# Patient Record
Sex: Female | Born: 1945
Health system: Southern US, Community
[De-identification: ages and names within clinical notes are randomized; demographics above are authoritative.]

## PROBLEM LIST (undated history)

## (undated) DIAGNOSIS — Z8639 Personal history of other endocrine, nutritional and metabolic disease: Secondary | ICD-10-CM

## (undated) DIAGNOSIS — T8859XA Other complications of anesthesia, initial encounter: Secondary | ICD-10-CM

## (undated) DIAGNOSIS — Z8619 Personal history of other infectious and parasitic diseases: Secondary | ICD-10-CM

## (undated) DIAGNOSIS — M1990 Unspecified osteoarthritis, unspecified site: Secondary | ICD-10-CM

## (undated) DIAGNOSIS — G459 Transient cerebral ischemic attack, unspecified: Secondary | ICD-10-CM

## (undated) DIAGNOSIS — R0789 Other chest pain: Secondary | ICD-10-CM

## (undated) DIAGNOSIS — M2351 Chronic instability of knee, right knee: Secondary | ICD-10-CM

## (undated) DIAGNOSIS — J189 Pneumonia, unspecified organism: Secondary | ICD-10-CM

## (undated) DIAGNOSIS — I10 Essential (primary) hypertension: Secondary | ICD-10-CM

## (undated) DIAGNOSIS — R0602 Shortness of breath: Secondary | ICD-10-CM

## (undated) DIAGNOSIS — K219 Gastro-esophageal reflux disease without esophagitis: Secondary | ICD-10-CM

## (undated) DIAGNOSIS — E119 Type 2 diabetes mellitus without complications: Secondary | ICD-10-CM

## (undated) HISTORY — PX: COLONOSCOPY: SHX174

## (undated) HISTORY — PX: DILATION AND CURETTAGE OF UTERUS: SHX78

---

## 2009-11-25 ENCOUNTER — Ambulatory Visit: Payer: Self-pay

## 2010-02-15 ENCOUNTER — Ambulatory Visit: Payer: Self-pay | Admitting: Family Medicine

## 2010-02-27 ENCOUNTER — Ambulatory Visit: Payer: Self-pay | Admitting: Family Medicine

## 2010-03-22 ENCOUNTER — Ambulatory Visit: Payer: Self-pay | Admitting: Surgery

## 2010-03-24 LAB — PATHOLOGY REPORT

## 2011-06-05 HISTORY — PX: CATARACT EXTRACTION: SUR2

## 2012-01-14 ENCOUNTER — Other Ambulatory Visit: Payer: Self-pay | Admitting: Orthopedic Surgery

## 2012-01-14 MED ORDER — BUPIVACAINE LIPOSOME 1.3 % IJ SUSP: 20.0000 mL | Freq: Once | INTRAMUSCULAR | Status: DC

## 2012-01-14 MED ORDER — DEXAMETHASONE SODIUM PHOSPHATE 10 MG/ML IJ SOLN: 10.0000 mg | Freq: Once | INTRAMUSCULAR | Status: DC

## 2012-01-14 NOTE — Progress Notes (Signed)
Preoperative surgical orders have been place into the [facility] hospital system for [place] on 01/14/2012, 9:30 AM  by Patrica Duel for surgery on 02/25/2012.  Preop Total Hip orders including Experel Injecion, IV Tylenol, and IV Decadron as long as there are no contraindications to the above medications. Avel Peace, PA-C

## 2012-01-30 ENCOUNTER — Ambulatory Visit: Payer: Self-pay | Admitting: Family Medicine

## 2012-02-12 ENCOUNTER — Ambulatory Visit: Payer: Self-pay | Admitting: Family Medicine

## 2012-02-18 ENCOUNTER — Encounter (HOSPITAL_COMMUNITY): Payer: Self-pay | Admitting: Pharmacy Technician

## 2012-02-20 ENCOUNTER — Encounter (HOSPITAL_COMMUNITY)
Admission: RE | Admit: 2012-02-20 | Discharge: 2012-02-20 | Disposition: A | Discharge status: Home / Self Care | Payer: Medicare Other | Source: Ambulatory Visit | Attending: Orthopedic Surgery | Admitting: Orthopedic Surgery

## 2012-02-20 ENCOUNTER — Ambulatory Visit (HOSPITAL_COMMUNITY)
Admission: RE | Admit: 2012-02-20 | Discharge: 2012-02-20 | Disposition: A | Discharge status: Home / Self Care | Payer: Medicare Other | Source: Ambulatory Visit | Attending: Orthopedic Surgery | Admitting: Orthopedic Surgery

## 2012-02-20 ENCOUNTER — Encounter (HOSPITAL_COMMUNITY): Payer: Self-pay

## 2012-02-20 DIAGNOSIS — K219 Gastro-esophageal reflux disease without esophagitis: Secondary | ICD-10-CM | POA: Insufficient documentation

## 2012-02-20 DIAGNOSIS — M169 Osteoarthritis of hip, unspecified: Secondary | ICD-10-CM | POA: Insufficient documentation

## 2012-02-20 DIAGNOSIS — M1610 Unilateral primary osteoarthritis, unspecified hip: Secondary | ICD-10-CM | POA: Insufficient documentation

## 2012-02-20 DIAGNOSIS — Z01812 Encounter for preprocedural laboratory examination: Secondary | ICD-10-CM | POA: Insufficient documentation

## 2012-02-20 HISTORY — DX: Unspecified osteoarthritis, unspecified site: M19.90

## 2012-02-20 HISTORY — DX: Gastro-esophageal reflux disease without esophagitis: K21.9

## 2012-02-20 LAB — CBC
HCT: 43 % (ref 36.0–46.0)
Hemoglobin: 14.4 g/dL (ref 12.0–15.0)
MCH: 31.9 pg (ref 26.0–34.0)
MCHC: 33.5 g/dL (ref 30.0–36.0)
MCV: 95.1 fL (ref 78.0–100.0)
Platelets: 276 10*3/uL (ref 150–400)
RBC: 4.52 MIL/uL (ref 3.87–5.11)
RDW: 12 % (ref 11.5–15.5)
WBC: 6.2 10*3/uL (ref 4.0–10.5)

## 2012-02-20 LAB — COMPREHENSIVE METABOLIC PANEL
ALT: 43 U/L — ABNORMAL HIGH (ref 0–35)
AST: 28 U/L (ref 0–37)
Albumin: 4.3 g/dL (ref 3.5–5.2)
Alkaline Phosphatase: 123 U/L — ABNORMAL HIGH (ref 39–117)
BUN: 17 mg/dL (ref 6–23)
CO2: 28 mEq/L (ref 19–32)
Calcium: 10.7 mg/dL — ABNORMAL HIGH (ref 8.4–10.5)
Chloride: 103 mEq/L (ref 96–112)
Creatinine, Ser: 0.91 mg/dL (ref 0.50–1.10)
GFR calc Af Amer: 73 mL/min — ABNORMAL LOW (ref >90)
GFR calc non Af Amer: 63 mL/min — ABNORMAL LOW (ref >90)
Glucose, Bld: 91 mg/dL (ref 70–99)
Potassium: 4.4 mEq/L (ref 3.5–5.1)
Sodium: 140 mEq/L (ref 135–145)
Total Bilirubin: 0.5 mg/dL (ref 0.3–1.2)
Total Protein: 7.5 g/dL (ref 6.0–8.3)

## 2012-02-20 LAB — URINALYSIS, ROUTINE W REFLEX MICROSCOPIC
Bilirubin Urine: NEGATIVE
Glucose, UA: NEGATIVE mg/dL
Ketones, ur: NEGATIVE mg/dL
Nitrite: NEGATIVE
Protein, ur: NEGATIVE mg/dL
Specific Gravity, Urine: 1.012 (ref 1.005–1.030)
Urobilinogen, UA: 0.2 mg/dL (ref 0.0–1.0)
pH: 6 (ref 5.0–8.0)

## 2012-02-20 LAB — SURGICAL PCR SCREEN
MRSA, PCR: NEGATIVE
Staphylococcus aureus: NEGATIVE

## 2012-02-20 LAB — URINE MICROSCOPIC-ADD ON

## 2012-02-20 LAB — PROTIME-INR
INR: 0.94 (ref 0.00–1.49)
Prothrombin Time: 12.5 seconds (ref 11.6–15.2)

## 2012-02-20 LAB — APTT: aPTT: 29 seconds (ref 24–37)

## 2012-02-20 MED ORDER — CHLORHEXIDINE GLUCONATE 4 % EX LIQD
60.0000 mL | Freq: Once | CUTANEOUS | Status: DC
  Filled 2012-02-20: qty 60

## 2012-02-20 NOTE — Progress Notes (Signed)
Abnormal UA faxed to Dr. Lequita Halt - confirmation recieved

## 2012-02-20 NOTE — Patient Instructions (Signed)
20 Charlotte Burns  02/20/2012   Your procedure is scheduled on: 02/25/12 AT 3:30 PM  Report to SHORT STAY DEPT  At  1:00 PM.  Call this number if you have problems the morning of surgery: [phone]   Remember:   Do not eat food or drink liquids AFTER MIDNIGHT  May have clear liquids UNTIL 6 HOURS BEFORE SURGERY (9:30 AM)  Clear liquids include soda, tea, black coffee, apple or grape juice, broth.  Take these medicines the morning of surgery with A SIP OF WATER:RANITIDINE / TRAMADOL   Do not wear jewelry, make-up or nail polish.  Do not wear lotions, powders, or perfumes.   Do not shave legs or underarms 48 hrs. before surgery (men may shave face)  Do not bring valuables to the hospital.  Contacts, dentures or bridgework may not be worn into surgery.  Leave suitcase in the car. After surgery it may be brought to your room.  For patients admitted to the hospital, checkout time is 11:00 AM the day of discharge.   Patients discharged the day of surgery will not be allowed to drive home. If going home same day of surgery, must have someone stay with you first 24 hrs at home and arrange for some one to drive you home from hospital.    Special Instructions:   Please read over the following fact sheets that you were given: MRSA  Information / Incentive Spirometer               SHOWER WITH BETASEPT THE NIGHT BEFORE SURGERY AND THE MORNING OF SURGERY          X_______________________________________________________________

## 2012-02-21 ENCOUNTER — Other Ambulatory Visit: Payer: Self-pay | Admitting: Orthopedic Surgery

## 2012-02-21 NOTE — H&P (Signed)
Charlotte Burns  DOB: [birth year] Married / Language: English / Race: White Female  Date of Admission:  02/25/2012  Chief complaint: Right Hip Pain  History of Present Illness The patient is a 66 year old female who comes in for a preoperative History and Physical. The patient is scheduled for a right total hip arthroplasty to be performed by Dr. Gus Rankin. Aluisio, MD at [facility] on 02/25/2012. The patient reports right hip problems including pain symptoms that have been present for 6 month(s). The symptoms began without any known injury (Patient states that she has pain in her right hip that radiates into her groin and down her leg. She said that she was told she would need a hip replacement.). The patient feels as if their symptoms are does feel they are worsening. This pain has been going on for a long time now. It is getting progressively worse over the past six months. It is limiting what she can and cannot do. She has lost range of motion in the hip. She has a hard time doing shoes and socks or trimming her toenails. She states she is at a stage where she feels like she needs to have something done because of the intense pain and dysfunction that she is experiencing. Her left hip is not hurting. She is not having lower back pain. She does not have lower extremity weakness or paresthesia with this.  She is ready to proceed with hip surgery. They have been treated conservatively in the past for the above stated problem and despite conservative measures, they continue to have progressive pain and severe functional limitations and dysfunction. They have failed non-operative management. It is felt that they would benefit from undergoing total joint replacement. Risks and benefits of the procedure have been discussed with the patient and they elect to proceed with surgery. There are no active contraindications to surgery such as ongoing infection or rapidly progressive  neurological disease.   Problem List Osteoarthritis, Hip (715.35) Knee Pain (719.46)   Allergies No Known Drug Allergies   Family History Congestive Heart Failure. mother Diabetes Mellitus. mother and sister Heart Disease. sister and brother Cancer. father and sister Cerebrovascular Accident. mother and sister Heart disease in female family member before age 60 Hypertension. mother Rheumatoid Arthritis. father and sister   Social History Living situation. live with spouse Marital status. 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 no Exercise. Exercises rarely Illicit drug use. no Pain Contract. no Previously in rehab. no Tobacco / smoke exposure. no Tobacco use. former smoker; smoke(d) 3 or more pack(s) per day Current work status. working part time Alcohol use. never consumed alcohol Children. 4 Post-Surgical Plans. Plan is to go home. Advance Directives. Living Will, Healthcare POA   Medication History TraMADol HCl (50MG  Tablet, Oral) Active. Diclofenac Sodium (75MG  Tablet DR, Oral) Active. Gabapentin (300MG  Capsule, Oral) Active. Zantac (150MG  Tablet, Oral) Active.   Pregnancy / Birth History Pregnant. no   Past Surgical History Dilation and Curettage of Uterus  Medical History Cataract Hypertension Hypercholesterolemia Gastroesophageal Reflux Disease   Review of Systems General:Not Present- Chills, Fever, Night Sweats, Fatigue, Weight Gain, Weight Loss and Memory Loss. Skin:Not Present- Hives, Itching, Rash, Eczema and Lesions. HEENT:Not Present- Tinnitus, Headache, Double Vision, Visual Loss, Hearing Loss and Dentures. Respiratory:Not Present- Shortness of breath with exertion, Shortness of breath at rest, Allergies, Coughing up blood and Chronic Cough. Cardiovascular:Not Present- Chest Pain, Racing/skipping heartbeats, Difficulty Breathing Lying Down, Murmur,  Swelling and Palpitations.  Gastrointestinal:Not Present- Bloody Stool, Heartburn, Abdominal Pain, Vomiting, Nausea, Constipation, Diarrhea, Difficulty Swallowing, Jaundice and Loss of appetitie. Female Genitourinary:Not Present- Blood in Urine, Urinary frequency, Weak urinary stream, Discharge, Flank Pain, Incontinence, Painful Urination, Urgency, Urinary Retention and Urinating at Night. Musculoskeletal:Present- Joint Pain. Not Present- Muscle Weakness, Muscle Pain, Joint Swelling, Back Pain, Morning Stiffness and Spasms. Neurological:Not Present- Tremor, Dizziness, Blackout spells, Paralysis, Difficulty with balance and Weakness. Psychiatric:Not Present- Insomnia.   Vitals Weight: 200 lb Height: 68 in Weight was reported by patient. Height was reported by patient. Body Surface Area: 2.09 m Body Mass Index: 30.41 kg/m Pulse: 74 (Regular) Resp.: 12 (Unlabored) BP: 114/72 (Sitting, Right Arm, Standard)    Physical Exam The physical exam findings are as follows:  Note: Patient is a 66 year old female with continued right hip pain. Patient is accompanied today by her husband.   General Mental Status - Alert, cooperative and good historian. General Appearance- pleasant. Not in acute distress. Orientation- Oriented X3. Build & Nutrition- Well nourished and Well developed.   Head and Neck Head- normocephalic, atraumatic . Neck Global Assessment- supple. no bruit auscultated on the right and no bruit auscultated on the left.   Eye Pupil- Bilateral- Regular and Round. Motion- Bilateral- EOMI.   ENMT  Partial upper plate  Chest and Lung Exam Auscultation: Breath sounds:- clear at anterior chest wall and - clear at posterior chest wall. Adventitious sounds:- No Adventitious sounds.   Cardiovascular Auscultation:Rhythm- Regular rate and rhythm. Heart Sounds- S1 WNL and S2 WNL. Murmurs & Other Heart Sounds:Auscultation of the heart  reveals - No Murmurs.   Abdomen Inspection:Contour- Generalized mild distention. Palpation/Percussion:Tenderness- Abdomen is non-tender to palpation. Rigidity (guarding)- Abdomen is soft. Auscultation:Auscultation of the abdomen reveals - Bowel sounds normal.   Female Genitourinary Not done, not pertinent to present illness  Musculoskeletal Very pleasant, well developed female alert and oriented in no apparent distress. The left hip can be flexed to 100, rotated in 20, out 30 and abducted 30 without discomfort. Right hip flexion to 90. No internal rotation, about 10 external rotation and 10 abduction. Pulse, sensation, and motor intact both lower extremities. Knee exam is unremarkable on the right. Gait pattern is significantly antalgic with an abductor lurch.  RADIOGRAPHS: AP pelvis and AP and lateral of the right hip show advanced end stage arthritis of the right hip with bone on bone change and subchondral cyst. AP both knees and lateral show that she does not have any significant arthritic change in either knee.  Assessment & Plan Osteoarthritis, Hip (715.35) Impression: Right Hip  Note: Patient is for a Right Total Hip Replacement by Dr. Lequita Halt.  Plan is to go home.  PCP - Dr. Jerl Mina - Patient has been seen preoperatively and felt to be stable for surgery.  Signed electronically by Roberts Gaudy, PA-C

## 2012-02-22 NOTE — Progress Notes (Signed)
Received fax from Dr.Aluisio - Rx Cipro called into pts pharmacy

## 2012-02-25 ENCOUNTER — Encounter (HOSPITAL_COMMUNITY)
Admission: RE | Disposition: A | Discharge status: Home Health | Payer: Self-pay | Source: Ambulatory Visit | Attending: Orthopedic Surgery

## 2012-02-25 ENCOUNTER — Encounter (HOSPITAL_COMMUNITY): Payer: Self-pay | Admitting: *Deleted

## 2012-02-25 ENCOUNTER — Inpatient Hospital Stay (HOSPITAL_COMMUNITY): Payer: Medicare Other

## 2012-02-25 ENCOUNTER — Encounter (HOSPITAL_COMMUNITY): Payer: Self-pay | Admitting: Anesthesiology

## 2012-02-25 ENCOUNTER — Inpatient Hospital Stay (HOSPITAL_COMMUNITY): Payer: Medicare Other | Admitting: Anesthesiology

## 2012-02-25 ENCOUNTER — Inpatient Hospital Stay (HOSPITAL_COMMUNITY)
Admission: RE | Admit: 2012-02-25 | Discharge: 2012-02-27 | DRG: 470 | Disposition: A | Discharge status: Home Health | Payer: Medicare Other | Source: Ambulatory Visit | Attending: Orthopedic Surgery | Admitting: Orthopedic Surgery

## 2012-02-25 DIAGNOSIS — M1610 Unilateral primary osteoarthritis, unspecified hip: Principal | ICD-10-CM | POA: Diagnosis present

## 2012-02-25 DIAGNOSIS — E785 Hyperlipidemia, unspecified: Secondary | ICD-10-CM | POA: Diagnosis present

## 2012-02-25 DIAGNOSIS — Z96649 Presence of unspecified artificial hip joint: Secondary | ICD-10-CM

## 2012-02-25 DIAGNOSIS — K219 Gastro-esophageal reflux disease without esophagitis: Secondary | ICD-10-CM | POA: Diagnosis present

## 2012-02-25 DIAGNOSIS — M169 Osteoarthritis of hip, unspecified: Secondary | ICD-10-CM | POA: Diagnosis present

## 2012-02-25 DIAGNOSIS — E871 Hypo-osmolality and hyponatremia: Secondary | ICD-10-CM | POA: Diagnosis not present

## 2012-02-25 DIAGNOSIS — D62 Acute posthemorrhagic anemia: Secondary | ICD-10-CM | POA: Diagnosis not present

## 2012-02-25 HISTORY — PX: TOTAL HIP ARTHROPLASTY: SHX124

## 2012-02-25 LAB — TYPE AND SCREEN
ABO/RH(D): O POS
Antibody Screen: NEGATIVE

## 2012-02-25 LAB — ABO/RH: ABO/RH(D): O POS

## 2012-02-25 SURGERY — ARTHROPLASTY, HIP, TOTAL,POSTERIOR APPROACH
Anesthesia: General | Site: Hip | Laterality: Right | Wound class: Clean

## 2012-02-25 MED ORDER — METOCLOPRAMIDE HCL 5 MG/ML IJ SOLN
5.0000 mg | Freq: Three times a day (TID) | INTRAMUSCULAR | Status: DC | PRN
  Administered 2012-02-25: 10 mg via INTRAVENOUS
  Filled 2012-02-25: qty 2

## 2012-02-25 MED ORDER — MIDAZOLAM HCL 5 MG/5ML IJ SOLN
INTRAMUSCULAR | Status: DC | PRN
  Administered 2012-02-25: 2 mg via INTRAVENOUS

## 2012-02-25 MED ORDER — DEXTROSE 5 % IV SOLN
3.0000 g | INTRAVENOUS | Status: AC
  Administered 2012-02-25: 2 g via INTRAVENOUS
  Filled 2012-02-25: qty 3000

## 2012-02-25 MED ORDER — HYDROMORPHONE HCL PF 1 MG/ML IJ SOLN
INTRAMUSCULAR | Status: AC
  Filled 2012-02-25: qty 1

## 2012-02-25 MED ORDER — ACETAMINOPHEN 325 MG PO TABS: 650.0000 mg | ORAL_TABLET | Freq: Four times a day (QID) | ORAL | Status: DC | PRN

## 2012-02-25 MED ORDER — ACETAMINOPHEN 650 MG RE SUPP: 650.0000 mg | Freq: Four times a day (QID) | RECTAL | Status: DC | PRN

## 2012-02-25 MED ORDER — RIVAROXABAN 10 MG PO TABS
10.0000 mg | ORAL_TABLET | Freq: Every day | ORAL | Status: DC
  Administered 2012-02-26 – 2012-02-27 (×2): 10 mg via ORAL
  Filled 2012-02-25 (×4): qty 1

## 2012-02-25 MED ORDER — DOCUSATE SODIUM 100 MG PO CAPS
100.0000 mg | ORAL_CAPSULE | Freq: Two times a day (BID) | ORAL | Status: DC
  Administered 2012-02-25 – 2012-02-27 (×4): 100 mg via ORAL

## 2012-02-25 MED ORDER — ACETAMINOPHEN 10 MG/ML IV SOLN: 1000.0000 mg | Freq: Once | INTRAVENOUS | Status: DC

## 2012-02-25 MED ORDER — LACTATED RINGERS IV SOLN: INTRAVENOUS | Status: DC

## 2012-02-25 MED ORDER — FLEET ENEMA 7-19 GM/118ML RE ENEM: 1.0000 | ENEMA | Freq: Once | RECTAL | Status: AC | PRN

## 2012-02-25 MED ORDER — MORPHINE SULFATE 2 MG/ML IJ SOLN
1.0000 mg | INTRAMUSCULAR | Status: DC | PRN
  Administered 2012-02-25: 1 mg via INTRAVENOUS
  Filled 2012-02-25: qty 1

## 2012-02-25 MED ORDER — HYDROMORPHONE HCL PF 1 MG/ML IJ SOLN
0.2500 mg | INTRAMUSCULAR | Status: DC | PRN
  Administered 2012-02-25 (×3): 0.5 mg via INTRAVENOUS

## 2012-02-25 MED ORDER — PHENOL 1.4 % MT LIQD
1.0000 | OROMUCOSAL | Status: DC | PRN
  Filled 2012-02-25: qty 177

## 2012-02-25 MED ORDER — ZOLPIDEM TARTRATE 5 MG PO TABS: 5.0000 mg | ORAL_TABLET | Freq: Every evening | ORAL | Status: DC | PRN

## 2012-02-25 MED ORDER — PROMETHAZINE HCL 25 MG/ML IJ SOLN: 6.2500 mg | INTRAMUSCULAR | Status: DC | PRN

## 2012-02-25 MED ORDER — METHOCARBAMOL 500 MG PO TABS
500.0000 mg | ORAL_TABLET | Freq: Four times a day (QID) | ORAL | Status: DC | PRN
  Administered 2012-02-26 (×2): 500 mg via ORAL
  Filled 2012-02-25 (×2): qty 1

## 2012-02-25 MED ORDER — ONDANSETRON HCL 4 MG/2ML IJ SOLN: 4.0000 mg | Freq: Four times a day (QID) | INTRAMUSCULAR | Status: DC | PRN

## 2012-02-25 MED ORDER — GABAPENTIN 300 MG PO CAPS
600.0000 mg | ORAL_CAPSULE | Freq: Every day | ORAL | Status: DC
  Administered 2012-02-25 – 2012-02-26 (×2): 600 mg via ORAL
  Filled 2012-02-25 (×4): qty 2

## 2012-02-25 MED ORDER — 0.9 % SODIUM CHLORIDE (POUR BTL) OPTIME
TOPICAL | Status: DC | PRN
  Administered 2012-02-25: 1000 mL

## 2012-02-25 MED ORDER — OXYCODONE HCL 5 MG PO TABS
5.0000 mg | ORAL_TABLET | ORAL | Status: DC | PRN
  Administered 2012-02-25 – 2012-02-27 (×6): 5 mg via ORAL
  Administered 2012-02-27: 10 mg via ORAL
  Filled 2012-02-25: qty 1
  Filled 2012-02-25: qty 2
  Filled 2012-02-25 (×5): qty 1

## 2012-02-25 MED ORDER — SODIUM CHLORIDE 0.9 % IV SOLN: INTRAVENOUS | Status: DC

## 2012-02-25 MED ORDER — BUPIVACAINE LIPOSOME 1.3 % IJ SUSP
20.0000 mL | INTRAMUSCULAR | Status: DC
  Filled 2012-02-25: qty 20

## 2012-02-25 MED ORDER — FENTANYL CITRATE 0.05 MG/ML IJ SOLN
INTRAMUSCULAR | Status: DC | PRN
  Administered 2012-02-25: 100 ug via INTRAVENOUS
  Administered 2012-02-25: 50 ug via INTRAVENOUS

## 2012-02-25 MED ORDER — GLYCOPYRROLATE 0.2 MG/ML IJ SOLN
INTRAMUSCULAR | Status: DC | PRN
  Administered 2012-02-25: 0.6 mg via INTRAVENOUS

## 2012-02-25 MED ORDER — PROPOFOL 10 MG/ML IV BOLUS
INTRAVENOUS | Status: DC | PRN
  Administered 2012-02-25: 170 mg via INTRAVENOUS

## 2012-02-25 MED ORDER — BISACODYL 10 MG RE SUPP: 10.0000 mg | Freq: Every day | RECTAL | Status: DC | PRN

## 2012-02-25 MED ORDER — LACTATED RINGERS IV SOLN
INTRAVENOUS | Status: DC | PRN
  Administered 2012-02-25 (×2): via INTRAVENOUS

## 2012-02-25 MED ORDER — TRAMADOL HCL 50 MG PO TABS: 50.0000 mg | ORAL_TABLET | Freq: Four times a day (QID) | ORAL | Status: DC | PRN

## 2012-02-25 MED ORDER — METHOCARBAMOL 100 MG/ML IJ SOLN
500.0000 mg | Freq: Four times a day (QID) | INTRAVENOUS | Status: DC | PRN
  Administered 2012-02-25: 500 mg via INTRAVENOUS
  Filled 2012-02-25 (×2): qty 5

## 2012-02-25 MED ORDER — ROCURONIUM BROMIDE 100 MG/10ML IV SOLN
INTRAVENOUS | Status: DC | PRN
  Administered 2012-02-25: 60 mg via INTRAVENOUS

## 2012-02-25 MED ORDER — POLYETHYLENE GLYCOL 3350 17 G PO PACK: 17.0000 g | PACK | Freq: Every day | ORAL | Status: DC | PRN

## 2012-02-25 MED ORDER — DIPHENHYDRAMINE HCL 12.5 MG/5ML PO ELIX: 12.5000 mg | ORAL_SOLUTION | ORAL | Status: DC | PRN

## 2012-02-25 MED ORDER — FAMOTIDINE 20 MG PO TABS
20.0000 mg | ORAL_TABLET | Freq: Every day | ORAL | Status: DC
  Administered 2012-02-26 – 2012-02-27 (×2): 20 mg via ORAL
  Filled 2012-02-25 (×3): qty 1

## 2012-02-25 MED ORDER — DEXTROSE-NACL 5-0.9 % IV SOLN
INTRAVENOUS | Status: DC
  Administered 2012-02-25 – 2012-02-27 (×2): via INTRAVENOUS

## 2012-02-25 MED ORDER — ONDANSETRON HCL 4 MG PO TABS: 4.0000 mg | ORAL_TABLET | Freq: Four times a day (QID) | ORAL | Status: DC | PRN

## 2012-02-25 MED ORDER — METOCLOPRAMIDE HCL 10 MG PO TABS: 5.0000 mg | ORAL_TABLET | Freq: Three times a day (TID) | ORAL | Status: DC | PRN

## 2012-02-25 MED ORDER — CEFAZOLIN SODIUM 1-5 GM-% IV SOLN
1.0000 g | Freq: Four times a day (QID) | INTRAVENOUS | Status: AC
Start: 2012-02-25 — End: 2012-02-26
  Administered 2012-02-25 – 2012-02-26 (×2): 1 g via INTRAVENOUS
  Filled 2012-02-25 (×2): qty 50

## 2012-02-25 MED ORDER — ACETAMINOPHEN 10 MG/ML IV SOLN
1000.0000 mg | Freq: Four times a day (QID) | INTRAVENOUS | Status: AC
  Administered 2012-02-25 – 2012-02-26 (×4): 1000 mg via INTRAVENOUS
  Filled 2012-02-25 (×6): qty 100

## 2012-02-25 MED ORDER — NEOSTIGMINE METHYLSULFATE 1 MG/ML IJ SOLN
INTRAMUSCULAR | Status: DC | PRN
  Administered 2012-02-25: 4 mg via INTRAVENOUS

## 2012-02-25 MED ORDER — MENTHOL 3 MG MT LOZG
1.0000 | LOZENGE | OROMUCOSAL | Status: DC | PRN
  Filled 2012-02-25: qty 9

## 2012-02-25 MED ORDER — ONDANSETRON HCL 4 MG/2ML IJ SOLN
INTRAMUSCULAR | Status: DC | PRN
  Administered 2012-02-25: 4 mg via INTRAVENOUS

## 2012-02-25 MED ORDER — BUPIVACAINE LIPOSOME 1.3 % IJ SUSP
INTRAMUSCULAR | Status: DC | PRN
  Administered 2012-02-25: 20 mL

## 2012-02-25 SURGICAL SUPPLY — 50 items
BAG ZIPLOCK 12X15 (MISCELLANEOUS) ×2 IMPLANT
BIT DRILL 2.8X128 (BIT) ×2 IMPLANT
BLADE EXTENDED COATED 6.5IN (ELECTRODE) ×2 IMPLANT
BLADE SAW SAG 73X25 THK (BLADE) ×1
BLADE SAW SGTL 73X25 THK (BLADE) ×1 IMPLANT
CLOTH BEACON ORANGE TIMEOUT ST (SAFETY) ×2 IMPLANT
CLSR STERI-STRIP ANTIMIC 1/2X4 (GAUZE/BANDAGES/DRESSINGS) ×2 IMPLANT
DECANTER SPIKE VIAL GLASS SM (MISCELLANEOUS) ×2 IMPLANT
DRAPE INCISE IOBAN 66X45 STRL (DRAPES) ×2 IMPLANT
DRAPE ORTHO SPLIT 77X108 STRL (DRAPES) ×4
DRAPE POUCH INSTRU U-SHP 10X18 (DRAPES) ×2 IMPLANT
DRAPE SURG ORHT 6 SPLT 77X108 (DRAPES) ×2 IMPLANT
DRAPE U-SHAPE 47X51 STRL (DRAPES) ×2 IMPLANT
DRSG ADAPTIC 3X8 NADH LF (GAUZE/BANDAGES/DRESSINGS) ×2 IMPLANT
DRSG MEPILEX BORDER 4X4 (GAUZE/BANDAGES/DRESSINGS) ×2 IMPLANT
DRSG MEPILEX BORDER 4X8 (GAUZE/BANDAGES/DRESSINGS) ×2 IMPLANT
DURAPREP 26ML APPLICATOR (WOUND CARE) ×2 IMPLANT
ELECT REM PT RETURN 9FT ADLT (ELECTROSURGICAL) ×2
ELECTRODE REM PT RTRN 9FT ADLT (ELECTROSURGICAL) ×1 IMPLANT
EVACUATOR 1/8 PVC DRAIN (DRAIN) ×2 IMPLANT
FACESHIELD LNG OPTICON STERILE (SAFETY) ×8 IMPLANT
GLOVE BIO SURGEON STRL SZ8 (GLOVE) ×2 IMPLANT
GLOVE BIOGEL PI IND STRL 8 (GLOVE) ×2 IMPLANT
GLOVE BIOGEL PI INDICATOR 8 (GLOVE) ×2
GLOVE ECLIPSE 8.0 STRL XLNG CF (GLOVE) ×2 IMPLANT
GLOVE SURG SS PI 6.5 STRL IVOR (GLOVE) ×4 IMPLANT
GOWN STRL NON-REIN LRG LVL3 (GOWN DISPOSABLE) ×4 IMPLANT
GOWN STRL REIN XL XLG (GOWN DISPOSABLE) ×2 IMPLANT
IMMOBILIZER KNEE 20 (SOFTGOODS) ×2
IMMOBILIZER KNEE 20 THIGH 36 (SOFTGOODS) ×1 IMPLANT
KIT BASIN OR (CUSTOM PROCEDURE TRAY) ×2 IMPLANT
MANIFOLD NEPTUNE II (INSTRUMENTS) ×2 IMPLANT
NDL SAFETY ECLIPSE 18X1.5 (NEEDLE) ×1 IMPLANT
NEEDLE HYPO 18GX1.5 SHARP (NEEDLE) ×2
NS IRRIG 1000ML POUR BTL (IV SOLUTION) ×2 IMPLANT
PACK TOTAL JOINT (CUSTOM PROCEDURE TRAY) ×2 IMPLANT
PASSER SUT SWANSON 36MM LOOP (INSTRUMENTS) ×2 IMPLANT
POSITIONER SURGICAL ARM (MISCELLANEOUS) ×2 IMPLANT
SPONGE GAUZE 4X4 12PLY (GAUZE/BANDAGES/DRESSINGS) ×2 IMPLANT
STRIP CLOSURE SKIN 1/2X4 (GAUZE/BANDAGES/DRESSINGS) ×4 IMPLANT
SUT ETHIBOND NAB CT1 #1 30IN (SUTURE) ×4 IMPLANT
SUT MNCRL AB 4-0 PS2 18 (SUTURE) ×2 IMPLANT
SUT VIC AB 2-0 CT1 27 (SUTURE) ×6
SUT VIC AB 2-0 CT1 TAPERPNT 27 (SUTURE) ×3 IMPLANT
SUT VLOC 180 0 24IN GS25 (SUTURE) ×4 IMPLANT
SYR 50ML LL SCALE MARK (SYRINGE) ×2 IMPLANT
TOWEL OR 17X26 10 PK STRL BLUE (TOWEL DISPOSABLE) ×4 IMPLANT
TOWEL OR NON WOVEN STRL DISP B (DISPOSABLE) ×2 IMPLANT
TRAY FOLEY CATH 14FRSI W/METER (CATHETERS) ×2 IMPLANT
WATER STERILE IRR 1500ML POUR (IV SOLUTION) ×2 IMPLANT

## 2012-02-25 NOTE — Transfer of Care (Signed)
Immediate Anesthesia Transfer of Care Note  Patient: Charlotte Burns  Procedure(s) Performed: Procedure(s) (LRB): TOTAL HIP ARTHROPLASTY (Right)  Patient Location: PACU  Anesthesia Type: General  Level of Consciousness: sedated, patient cooperative and responds to stimulaton  Airway & Oxygen Therapy: Patient Spontanous Breathing and Patient connected to face mask oxgen  Post-op Assessment: Report given to PACU RN and Post -op Vital signs reviewed and stable  Post vital signs: Reviewed and stable  Complications: No apparent anesthesia complications

## 2012-02-25 NOTE — Anesthesia Preprocedure Evaluation (Signed)
Anesthesia Evaluation  Patient identified by MRN, date of birth, ID band Patient awake    Reviewed: Allergy & Precautions, H&P , NPO status , Patient's Chart, lab work & pertinent test results  Airway Mallampati: II TM Distance: >3 FB Neck ROM: Full    Dental  (+) Teeth Intact, Dental Advisory Given and Partial Lower   Pulmonary neg pulmonary ROS,  breath sounds clear to auscultation  Pulmonary exam normal       Cardiovascular negative cardio ROS  Rhythm:Regular Rate:Normal     Neuro/Psych negative neurological ROS  negative psych ROS   GI/Hepatic negative GI ROS, Neg liver ROS, GERD-  Medicated,  Endo/Other  negative endocrine ROS  Renal/GU negative Renal ROS  negative genitourinary   Musculoskeletal negative musculoskeletal ROS (+)   Abdominal   Peds  Hematology negative hematology ROS (+)   Anesthesia Other Findings   Reproductive/Obstetrics negative OB ROS                           Anesthesia Physical Anesthesia Plan  ASA: I  Anesthesia Plan: General   Post-op Pain Management:    Induction: Intravenous  Airway Management Planned: Oral ETT  Additional Equipment:   Intra-op Plan:   Post-operative Plan: Extubation in OR  Informed Consent: I have reviewed the patients History and Physical, chart, labs and discussed the procedure including the risks, benefits and alternatives for the proposed anesthesia with the patient or authorized representative who has indicated his/her understanding and acceptance.   Dental advisory given  Plan Discussed with: CRNA  Anesthesia Plan Comments:         Anesthesia Quick Evaluation

## 2012-02-25 NOTE — Op Note (Signed)
Pre-operative diagnosis- Osteoarthritis Right hip  Post-operative diagnosis- Osteoarthritis  Right hip  Procedure-  RightTotal Hip Arthroplasty  Surgeon- Charlotte Burns. Charlotte Sahakian, MD  Assistant- Charlotte Ped, PA-C   Anesthesia  General  EBL- 300   Drain Hemovac   Complication- None  Condition-PACU - hemodynamically stable.   Brief Clinical Note-  Charlotte Burns is a 66 y.o. female with end stage arthritis of her right hip with progressively worsening pain and dysfunction. Pain occurs with activity and rest including pain at night. She has tried analgesics, protected weight bearing and rest without benefit. Pain is too severe to attempt physical therapy. Radiographs demonstrate bone on bone arthritis with subchondral cyst formation. She presents now for right THA.  Procedure in detail-   The patient is brought into the operating room and placed on the operating table. After successful administration of General  anesthesia, the patient is placed in the  Left lateral decubitus position with the  Right side up and held in place with the hip positioner. The lower extremity is isolated from the perineum with plastic drapes and time-out is performed by the surgical team. The lower extremity is then prepped and draped in the usual sterile fashion. A short posterolateral incision is made with a ten blade through the subcutaneous tissue to the level of the fascia lata which is incised in line with the skin incision. The sciatic nerve is palpated and protected and the short external rotators and capsule are isolated from the femur. The hip is then dislocated and the center of the femoral head is marked. A trial prosthesis is placed such that the trial head corresponds to the center of the patients' native femoral head. The resection level is marked on the femoral neck and the resection is made with an oscillating saw. The femoral head is removed and femoral retractors placed to gain access to the femoral  canal.      The canal finder is passed into the femoral canal and the canal is thoroughly irrigated with sterile saline to remove the fatty contents. Axial reaming is performed to 13.5  mm, proximal reaming to 53F  and the sleeve machined to a large. A 53F large trial sleeve is placed into the proximal femur.      The femur is then retracted anteriorly to gain acetabular exposure. Acetabular retractors are placed and the labrum and osteophytes are removed, Acetabular reaming is performed to 53  mm and a 54  mm Pinnacle acetabular shell is placed in anatomic position with excellent purchase. Additional dome screws were not needed. An apex hole eliminator is placed and the permanent 36 mm neutral + 4 Marathon liner is placed into the acetabular shell.      The trial femur is then placed into the femoral canal. The size is 18 x 13  stem with a 36 + 8  neck and a 36 + 3 head with the neck version matching  the patients' native anteversion. The hip is reduced with excellent stability with full extension and full external rotation, 70 degrees flexion with 40 degrees adduction and 90 degrees internal rotation and 90 degrees of flexion with 70 degrees of internal rotation. The operative leg is placed on top of the non-operative leg and the leg lengths are found to be equal. The trials are then removed and the permanent implant of the same size is impacted into the femoral canal. The  Ceramic femoral head of the same size as the trial is placed and the hip  is reduced with the same stability parameters. The operative leg is again placed on top of the non-operative leg and the leg lengths are found to be equal.      The wound is then copiously irrigated with saline solution and the capsule and short external rotators are re-attached to the femur through drill holes with Ethibond suture. The fascia lata is closed over a hemovac drain with #1 vicryl suture and the fascia lata, gluteal muscles and subcutaneous tissues are  injected with Exparel 20ml diluted with saline 50ml, .25% Marcaine. The subcutaneous tissues are closed with #1 and2-0 vicryl and the subcuticular layer closed with running 4-0 Monocryl. The drain is hooked to suction, incision cleaned and dried, and steri-srips and a bulky sterile dressing applied. The limb is placed into a knee immobilizer and the patient is awakened and transported to recovery in stable condition.      Please note that a surgical assistant was a medical necessity for this procedure in order to perform it in a safe and expeditious manner. The assistant was necessary to provide retraction to the vital neurovascular structures and to retract and position the limb to allow for anatomic placement of the prosthetic components.  Charlotte Burns Charlotte Uddin, MD    02/25/2012, 4:39 PM

## 2012-02-25 NOTE — Anesthesia Postprocedure Evaluation (Signed)
  Anesthesia Post-op Note  Patient: Charlotte Burns  Procedure(s) Performed: Procedure(s) (LRB): TOTAL HIP ARTHROPLASTY (Right)  Patient Location: PACU  Anesthesia Type: General  Level of Consciousness: awake and alert   Airway and Oxygen Therapy: Patient Spontanous Breathing  Post-op Pain: mild  Post-op Assessment: Post-op Vital signs reviewed, Patient's Cardiovascular Status Stable, Respiratory Function Stable, Patent Airway and No signs of Nausea or vomiting  Post-op Vital Signs: stable  Complications: No apparent anesthesia complications

## 2012-02-25 NOTE — Interval H&P Note (Signed)
History and Physical Interval Note:  02/25/2012 3:08 PM  Charlotte Burns  has presented today for surgery, with the diagnosis of osteoarthritis right hip  The various methods of treatment have been discussed with the patient and family. After consideration of risks, benefits and other options for treatment, the patient has consented to  Procedure(s) (LRB) with comments: TOTAL HIP ARTHROPLASTY (Right) as a surgical intervention .  The patient's history has been reviewed, patient examined, no change in status, stable for surgery.  I have reviewed the patient's chart and labs.  Questions were answered to the patient's satisfaction.     Loanne Drilling

## 2012-02-25 NOTE — H&P (View-Only) (Signed)
Charlotte Burns  DOB: 10/07/1945 Married / Language: English / Race: White Female  Date of Admission:  02/25/2012  Chief complaint: Right Hip Pain  History of Present Illness The patient is a 66 year old female who comes in for a preoperative History and Physical. The patient is scheduled for a right total hip arthroplasty to be performed by Dr. Frank V. Aluisio, MD at [facility] on 02/25/2012. The patient reports right hip problems including pain symptoms that have been present for 6 month(s). The symptoms began without any known injury (Patient states that she has pain in her right hip that radiates into her groin and down her leg. She said that she was told she would need a hip replacement.). The patient feels as if their symptoms are does feel they are worsening. This pain has been going on for a long time now. It is getting progressively worse over the past six months. It is limiting what she can and cannot do. She has lost range of motion in the hip. She has a hard time doing shoes and socks or trimming her toenails. She states she is at a stage where she feels like she needs to have something done because of the intense pain and dysfunction that she is experiencing. Her left hip is not hurting. She is not having lower back pain. She does not have lower extremity weakness or paresthesia with this.  She is ready to proceed with hip surgery. They have been treated conservatively in the past for the above stated problem and despite conservative measures, they continue to have progressive pain and severe functional limitations and dysfunction. They have failed non-operative management. It is felt that they would benefit from undergoing total joint replacement. Risks and benefits of the procedure have been discussed with the patient and they elect to proceed with surgery. There are no active contraindications to surgery such as ongoing infection or rapidly progressive  neurological disease.   Problem List Osteoarthritis, Hip (715.35) Knee Pain (719.46)   Allergies No Known Drug Allergies   Family History Congestive Heart Failure. mother Diabetes Mellitus. mother and sister Heart Disease. sister and brother Cancer. father and sister Cerebrovascular Accident. mother and sister Heart disease in female family member before age 65 Hypertension. mother Rheumatoid Arthritis. father and sister   Social History Living situation. live with spouse Marital status. 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 no Exercise. Exercises rarely Illicit drug use. no Pain Contract. no Previously in rehab. no Tobacco / smoke exposure. no Tobacco use. former smoker; smoke(d) 3 or more pack(s) per day Current work status. working part time Alcohol use. never consumed alcohol Children. 4 Post-Surgical Plans. Plan is to go home. Advance Directives. Living Will, Healthcare POA   Medication History TraMADol HCl (50MG Tablet, Oral) Active. Diclofenac Sodium (75MG Tablet DR, Oral) Active. Gabapentin (300MG Capsule, Oral) Active. Zantac (150MG Tablet, Oral) Active.   Pregnancy / Birth History Pregnant. no   Past Surgical History Dilation and Curettage of Uterus  Medical History Cataract Hypertension Hypercholesterolemia Gastroesophageal Reflux Disease   Review of Systems General:Not Present- Chills, Fever, Night Sweats, Fatigue, Weight Gain, Weight Loss and Memory Loss. Skin:Not Present- Hives, Itching, Rash, Eczema and Lesions. HEENT:Not Present- Tinnitus, Headache, Double Vision, Visual Loss, Hearing Loss and Dentures. Respiratory:Not Present- Shortness of breath with exertion, Shortness of breath at rest, Allergies, Coughing up blood and Chronic Cough. Cardiovascular:Not Present- Chest Pain, Racing/skipping heartbeats, Difficulty Breathing Lying Down, Murmur,  Swelling and Palpitations.   Gastrointestinal:Not Present- Bloody Stool, Heartburn, Abdominal Pain, Vomiting, Nausea, Constipation, Diarrhea, Difficulty Swallowing, Jaundice and Loss of appetitie. Female Genitourinary:Not Present- Blood in Urine, Urinary frequency, Weak urinary stream, Discharge, Flank Pain, Incontinence, Painful Urination, Urgency, Urinary Retention and Urinating at Night. Musculoskeletal:Present- Joint Pain. Not Present- Muscle Weakness, Muscle Pain, Joint Swelling, Back Pain, Morning Stiffness and Spasms. Neurological:Not Present- Tremor, Dizziness, Blackout spells, Paralysis, Difficulty with balance and Weakness. Psychiatric:Not Present- Insomnia.   Vitals Weight: 200 lb Height: 68 in Weight was reported by patient. Height was reported by patient. Body Surface Area: 2.09 m Body Mass Index: 30.41 kg/m Pulse: 74 (Regular) Resp.: 12 (Unlabored) BP: 114/72 (Sitting, Right Arm, Standard)    Physical Exam The physical exam findings are as follows:  Note: Patient is a 65 year old female with continued right hip pain. Patient is accompanied today by her husband.   General Mental Status - Alert, cooperative and good historian. General Appearance- pleasant. Not in acute distress. Orientation- Oriented X3. Build & Nutrition- Well nourished and Well developed.   Head and Neck Head- normocephalic, atraumatic . Neck Global Assessment- supple. no bruit auscultated on the right and no bruit auscultated on the left.   Eye Pupil- Bilateral- Regular and Round. Motion- Bilateral- EOMI.   ENMT  Partial upper plate  Chest and Lung Exam Auscultation: Breath sounds:- clear at anterior chest wall and - clear at posterior chest wall. Adventitious sounds:- No Adventitious sounds.   Cardiovascular Auscultation:Rhythm- Regular rate and rhythm. Heart Sounds- S1 WNL and S2 WNL. Murmurs & Other Heart Sounds:Auscultation of the heart  reveals - No Murmurs.   Abdomen Inspection:Contour- Generalized mild distention. Palpation/Percussion:Tenderness- Abdomen is non-tender to palpation. Rigidity (guarding)- Abdomen is soft. Auscultation:Auscultation of the abdomen reveals - Bowel sounds normal.   Female Genitourinary Not done, not pertinent to present illness  Musculoskeletal Very pleasant, well developed female alert and oriented in no apparent distress. The left hip can be flexed to 100, rotated in 20, out 30 and abducted 30 without discomfort. Right hip flexion to 90. No internal rotation, about 10 external rotation and 10 abduction. Pulse, sensation, and motor intact both lower extremities. Knee exam is unremarkable on the right. Gait pattern is significantly antalgic with an abductor lurch.  RADIOGRAPHS: AP pelvis and AP and lateral of the right hip show advanced end stage arthritis of the right hip with bone on bone change and subchondral cyst. AP both knees and lateral show that she does not have any significant arthritic change in either knee.  Assessment & Plan Osteoarthritis, Hip (715.35) Impression: Right Hip  Note: Patient is for a Right Total Hip Replacement by Dr. Aluisio.  Plan is to go home.  PCP - Dr. James Hedrick - Patient has been seen preoperatively and felt to be stable for surgery.  Signed electronically by DREW L Livia Tarr, PA-C 

## 2012-02-26 ENCOUNTER — Encounter (HOSPITAL_COMMUNITY): Payer: Self-pay | Admitting: Orthopedic Surgery

## 2012-02-26 DIAGNOSIS — E871 Hypo-osmolality and hyponatremia: Secondary | ICD-10-CM | POA: Diagnosis not present

## 2012-02-26 DIAGNOSIS — D62 Acute posthemorrhagic anemia: Secondary | ICD-10-CM | POA: Diagnosis not present

## 2012-02-26 LAB — BASIC METABOLIC PANEL
BUN: 10 mg/dL (ref 6–23)
CO2: 28 mEq/L (ref 19–32)
Calcium: 8.6 mg/dL (ref 8.4–10.5)
Chloride: 101 mEq/L (ref 96–112)
Creatinine, Ser: 0.87 mg/dL (ref 0.50–1.10)
GFR calc Af Amer: 78 mL/min — ABNORMAL LOW (ref >90)
GFR calc non Af Amer: 67 mL/min — ABNORMAL LOW (ref >90)
Glucose, Bld: 146 mg/dL — ABNORMAL HIGH (ref 70–99)
Potassium: 4 mEq/L (ref 3.5–5.1)
Sodium: 134 mEq/L — ABNORMAL LOW (ref 135–145)

## 2012-02-26 LAB — CBC
HCT: 33.3 % — ABNORMAL LOW (ref 36.0–46.0)
Hemoglobin: 11.3 g/dL — ABNORMAL LOW (ref 12.0–15.0)
MCH: 31.9 pg (ref 26.0–34.0)
MCHC: 33.9 g/dL (ref 30.0–36.0)
MCV: 94.1 fL (ref 78.0–100.0)
Platelets: 189 10*3/uL (ref 150–400)
RBC: 3.54 MIL/uL — ABNORMAL LOW (ref 3.87–5.11)
RDW: 11.9 % (ref 11.5–15.5)
WBC: 7.5 10*3/uL (ref 4.0–10.5)

## 2012-02-26 NOTE — Care Management Note (Addendum)
    Page 1 of 2   02/27/2012     4:40:29 PM   CARE MANAGEMENT NOTE 02/27/2012  Patient:  Charlotte Burns, Charlotte Burns   Account Number:  [id number]  Date Initiated:  02/26/2012  Documentation initiated by:  Colleen Can  Subjective/Objective Assessment:   dx rt hip osteoarthritis; total hip arthroplasty     Action/Plan:   CM spoke with patient. Patient is planning to go back to her home in [facility] where spouse will be caregiver. Already has RW, cane and bsc. Wants agency that is in network.CM to began search.   Anticipated DC Date:  02/28/2012   Anticipated DC Plan:  HOME W HOME HEALTH SERVICES  In-house referral  NA      DC Planning Services  CM consult      [facility] Choice  HOME HEALTH   Choice offered to / List presented to:  C-1 Patient   DME arranged  NA      DME agency  NA     HH arranged  HH-2 PT  HH-3 OT      [facility] agency  [facility]   Status of service:  Completed, signed off Medicare Important Message given?  NA - LOS <3 / Initial given by admissions (If response is "NO", the following Medicare IM given date fields will be blank) Date Medicare IM given:   Date Additional Medicare IM given:    Discharge Disposition:  HOME W HOME HEALTH SERVICES  Per UR Regulation:    If discussed at Long Length of Stay Meetings, dates discussed:    Comments:  02/27/2012 Raynelle Bring BSN CCM [phone] Spoke with Victorino Dike at [facility] who advised that they can provide [facility] services for patient who lives in [place], [place]. [place] services will start tomorrow. Face sheet, HH orders, op note, h&p, and PT notes faxed to [phone] with confirmation .

## 2012-02-26 NOTE — Progress Notes (Signed)
Utilization review completed.  

## 2012-02-26 NOTE — Progress Notes (Signed)
Physical Therapy Treatment Note   02/26/12 1500  PT Visit Information  Last PT Received On 02/26/12  Assistance Needed +1  PT Time Calculation  PT Start Time 1337  PT Stop Time 1402  PT Time Calculation (min) 25 min  Subjective Data  Subjective It mostly hurts with walking.  Precautions  Precautions Posterior Hip  Precaution Comments reviewed hip precautions, pt recalled 1/3  Restrictions  RLE Weight Bearing WBAT  Cognition  Overall Cognitive Status Appears within functional limits for tasks assessed/performed  Bed Mobility  Bed Mobility Supine to Sit;Sit to Supine  Supine to Sit 4: Min assist  Sit to Supine 4: Min guard  Details for Bed Mobility Assistance verbal cues for technique, assist for R LE over EOB, pt able to bring LE onto bed with UE assist  Transfers  Transfers Stand to Sit;Sit to Stand  Sit to Stand 4: Min assist;With upper extremity assist;From elevated surface;From bed  Stand to Sit With upper extremity assist;4: Min guard;To bed  Details for Transfer Assistance verbal cues for technique and hip precautions  Ambulation/Gait  Ambulation/Gait Assistance 4: Min guard  Ambulation Distance (Feet) 80 Feet  Assistive device Rolling walker  Ambulation/Gait Assistance Details pt able to recall turning towards nonsurgical leg, verbal cue for safe RW distance  Gait Pattern Step-to pattern;Decreased stance time - right;Antalgic  Gait velocity decreased  Total Joint Exercises  Ankle Circles/Pumps AROM;Both;20 reps  Quad Sets AROM;Both;20 reps  Gluteal Sets AROM;Both;20 reps  Short Arc Quad AROM;Strengthening;Right;20 reps  Heel Slides AROM;Right;Strengthening;20 reps;Other (comment) (within precautions)  Hip ABduction/ADduction AROM;Strengthening;Right;15 reps  Straight Leg Raises AAROM;Strengthening;Right;10 reps;Supine;Other (comment) (within precautions)  PT - End of Session  Activity Tolerance Patient tolerated treatment well  Patient left in bed;with call  bell/phone within reach  PT - Assessment/Plan  Comments on Treatment Session Pt ambulated in hallway and performed exercises.  Pt doing well with mobility.  Plan to practice stairs and ambulate again prior to d/c.  PT Plan Discharge plan remains appropriate;Frequency remains appropriate  Follow Up Recommendations Home health PT  Equipment Recommended None recommended by PT  Acute Rehab PT Goals  PT Goal: Supine/Side to Sit - Progress Progressing toward goal  PT Goal: Sit to Supine/Side - Progress Progressing toward goal  PT Goal: Sit to Stand - Progress Progressing toward goal  PT Goal: Stand to Sit - Progress Progressing toward goal  PT Goal: Ambulate - Progress Progressing toward goal  PT Goal: Perform Home Exercise Program - Progress Progressing toward goal  PT General Charges  $$ ACUTE PT VISIT 1 Procedure  PT Treatments  $Gait Training 8-22 mins  $Therapeutic Exercise 8-22 mins    Zenovia Jarred, PT Pager: [phone]

## 2012-02-26 NOTE — Progress Notes (Signed)
   Subjective: 1 Day Post-Op Procedure(s) (LRB): TOTAL HIP ARTHROPLASTY (Right) Patient reports pain as mild.   Patient seen in rounds with Dr. Lequita Halt. Patient is well, and has had no acute complaints or problems We will start therapy today.  Plan is to go Home after hospital stay.  Objective: Vital signs in last 24 hours: Temp:  [97 F (36.1 C)-97.8 F (36.6 C)] 97.7 F (36.5 C) (09/24 0435) Pulse Rate:  [54-78] 62  (09/24 0435) Resp:  [9-20] 12  (09/24 0435) BP: (102-148)/(61-84) 113/74 mmHg (09/24 0435) SpO2:  [95 %-100 %] 100 % (09/24 0435) FiO2 (%):  [99 %] 99 % (09/23 1815) Weight:  [94.348 kg (208 lb)-94.8 kg (208 lb 15.9 oz)] 94.8 kg (208 lb 15.9 oz) (09/23 1816)  Intake/Output from previous day:  Intake/Output Summary (Last 24 hours) at 02/26/12 0816 Last data filed at 02/26/12 0600  Gross per 24 hour  Intake 2583.75 ml  Output   2340 ml  Net 243.75 ml    Intake/Output this shift:    Labs:  Basename 02/26/12 0420  HGB 11.3*    Basename 02/26/12 0420  WBC 7.5  RBC 3.54*  HCT 33.3*  PLT 189    Basename 02/26/12 0420  NA 134*  K 4.0  CL 101  CO2 28  BUN 10  CREATININE 0.87  GLUCOSE 146*  CALCIUM 8.6   No results found for this basename: LABPT:2,INR:2 in the last 72 hours  EXAM General - Patient is Alert, Appropriate and Oriented Extremity - Neurovascular intact Sensation intact distally Dorsiflexion/Plantar flexion intact Dressing - dressing C/D/I Motor Function - intact, moving foot and toes well on exam.  Hemovac pulled without difficulty.  Past Medical History  Diagnosis Date  . Hyperlipidemia   . Arthritis   . GERD (gastroesophageal reflux disease)     Assessment/Plan: 1 Day Post-Op Procedure(s) (LRB): TOTAL HIP ARTHROPLASTY (Right) Principal Problem:  *OA (osteoarthritis) of hip Active Problems:  [place] Acute blood loss anemia  Postop Hyponatremia   Advance diet Up with therapy Discharge home with home health  DVT  Prophylaxis - Xarelto Weight Bearing As Tolerated right Leg D/C Knee Immobilizer Hemovac Pulled Begin Therapy Hip Preacutions No vaccines.  Arria Naim 02/26/2012, 8:16 AM

## 2012-02-26 NOTE — Evaluation (Signed)
Physical Therapy Evaluation Patient Details Name: Charlotte Burns MRN: [id number] DOB: [birth year] Today's Date: 02/26/2012 Time: 1003-1020 PT Time Calculation (min): 17 min  PT Assessment / Plan / Recommendation Clinical Impression  Pt s/p R THR.  Pt would benefit from acute PT services in order to improve independence with transfers, ambulation, and stairs to prepare for d/c home with spouse and son.  Pt educated in hip precautions and given handout.    PT Assessment  Patient needs continued PT services    Follow Up Recommendations  Home health PT    Barriers to Discharge        Equipment Recommendations  None recommended by PT    Recommendations for Other Services     Frequency 7X/week    Precautions / Restrictions Precautions Precautions: Posterior Hip Precaution Comments: given handout Restrictions RLE Weight Bearing: Weight bearing as tolerated   Pertinent Vitals/Pain 3/10 R hip, repositioned, premedicated      Mobility  Bed Mobility Bed Mobility: Supine to Sit Supine to Sit: 3: Mod assist;HOB elevated;With rails Details for Bed Mobility Assistance: verbal cues for technique and hip precautions, assist for R LE and use of bed pad to assist with forward scoot to EOB, pt also required a hand to pull up trunk Transfers Transfers: Stand to Sit;Sit to Stand Sit to Stand: 4: Min assist;With upper extremity assist;From elevated surface;From bed Stand to Sit: To chair/3-in-1;4: Min assist;With upper extremity assist Details for Transfer Assistance: verbal cues for technique and hip precautions Ambulation/Gait Ambulation/Gait Assistance: 4: Min guard Ambulation Distance (Feet): 80 Feet Assistive device: Rolling walker Ambulation/Gait Assistance Details: verbal cues for sequence, safe RW use, and posture Gait Pattern: Step-to pattern;Decreased stance time - right;Antalgic Gait velocity: decreased    Exercises     PT Diagnosis: Acute pain;Difficulty walking  PT  Problem List: Decreased strength;Decreased activity tolerance;Decreased mobility;Decreased range of motion;Decreased knowledge of use of DME;Decreased knowledge of precautions;Pain PT Treatment Interventions: DME instruction;Gait training;Stair training;Functional mobility training;Therapeutic activities;Therapeutic exercise;Patient/family education   PT Goals Acute Rehab PT Goals PT Goal Formulation: With patient Time For Goal Achievement: 03/04/12 Potential to Achieve Goals: Good Pt will go Supine/Side to Sit: with modified independence PT Goal: Supine/Side to Sit - Progress: Goal set today Pt will go Sit to Supine/Side: with modified independence PT Goal: Sit to Supine/Side - Progress: Goal set today Pt will go Sit to Stand: with modified independence PT Goal: Sit to Stand - Progress: Goal set today Pt will go Stand to Sit: with modified independence PT Goal: Stand to Sit - Progress: Goal set today Pt will Ambulate: >150 feet;with least restrictive assistive device;with modified independence PT Goal: Ambulate - Progress: Goal set today Pt will Go Up / Down Stairs: 3-5 stairs;with supervision;with least restrictive assistive device PT Goal: Up/Down Stairs - Progress: Goal set today Pt will Perform Home Exercise Program: with supervision, verbal cues required/provided PT Goal: Perform Home Exercise Program - Progress: Goal set today  Visit Information  Last PT Received On: 02/26/12 Assistance Needed: +1    Subjective Data  Subjective: I'm used to pain, plus it feels better than before the surgery.   Prior Functioning  Home Living Lives With: Spouse;Son Available Help at Discharge: Family Type of Home: House Home Access: Stairs to enter Secretary/administrator of Steps: 3 Entrance Stairs-Rails: Right Home Layout: One level Home Adaptive Equipment: Bedside commode/3-in-1;Walker - rolling;Straight cane Prior Function Level of Independence: Independent with assistive  device(s) Communication Communication: No difficulties    Cognition  Overall Cognitive  Status: Appears within functional limits for tasks assessed/performed Arousal/Alertness: Awake/alert Orientation Level: Appears intact for tasks assessed Behavior During Session: [facility] for tasks performed    Extremity/Trunk Assessment Right Upper Extremity Assessment RUE ROM/Strength/Tone: [facility] for tasks assessed Left Upper Extremity Assessment LUE ROM/Strength/Tone: WFL for tasks assessed Right Lower Extremity Assessment RLE ROM/Strength/Tone: Deficits RLE ROM/Strength/Tone Deficits: decreased active movement of R LE against gravity requiring assist for movement with transfers Left Lower Extremity Assessment LLE ROM/Strength/Tone: [facility] for tasks assessed   Balance    End of Session PT - End of Session Equipment Utilized During Treatment: Gait belt Activity Tolerance: Patient tolerated treatment well Patient left: in chair;with call bell/phone within reach  GP     Charlotte Burns,KATHrine E 02/26/2012, 12:35 PM Pager: [phone]

## 2012-02-27 LAB — CBC
HCT: 33.6 % — ABNORMAL LOW (ref 36.0–46.0)
Hemoglobin: 11.3 g/dL — ABNORMAL LOW (ref 12.0–15.0)
MCH: 31.9 pg (ref 26.0–34.0)
MCHC: 33.6 g/dL (ref 30.0–36.0)
MCV: 94.9 fL (ref 78.0–100.0)
Platelets: 180 10*3/uL (ref 150–400)
RBC: 3.54 MIL/uL — ABNORMAL LOW (ref 3.87–5.11)
RDW: 12.1 % (ref 11.5–15.5)
WBC: 7.9 10*3/uL (ref 4.0–10.5)

## 2012-02-27 LAB — BASIC METABOLIC PANEL
BUN: 8 mg/dL (ref 6–23)
CO2: 26 mEq/L (ref 19–32)
Calcium: 8.9 mg/dL (ref 8.4–10.5)
Chloride: 102 mEq/L (ref 96–112)
Creatinine, Ser: 0.84 mg/dL (ref 0.50–1.10)
GFR calc Af Amer: 81 mL/min — ABNORMAL LOW (ref >90)
GFR calc non Af Amer: 70 mL/min — ABNORMAL LOW (ref >90)
Glucose, Bld: 173 mg/dL — ABNORMAL HIGH (ref 70–99)
Potassium: 3.6 mEq/L (ref 3.5–5.1)
Sodium: 136 mEq/L (ref 135–145)

## 2012-02-27 MED ORDER — HYDROCODONE-ACETAMINOPHEN 5-325 MG PO TABS: 1.0000 | ORAL_TABLET | ORAL | Status: DC | PRN

## 2012-02-27 MED ORDER — RIVAROXABAN 10 MG PO TABS: 10.0000 mg | ORAL_TABLET | Freq: Every day | ORAL | Status: DC

## 2012-02-27 MED ORDER — HYDROCODONE-ACETAMINOPHEN 5-325 MG PO TABS
1.0000 | ORAL_TABLET | ORAL | Status: DC | PRN
  Administered 2012-02-27: 1 via ORAL
  Filled 2012-02-27: qty 1

## 2012-02-27 MED ORDER — METHOCARBAMOL 500 MG PO TABS: 500.0000 mg | ORAL_TABLET | Freq: Four times a day (QID) | ORAL | Status: DC | PRN

## 2012-02-27 NOTE — Discharge Summary (Signed)
Physician Discharge Summary   Patient ID: Charlotte Burns MRN: [id number] DOB/AGE: 68-Apr-1947 68 y.o.  Admit date: 02/25/2012 Discharge date: 02/27/2012  Primary Diagnosis: Osteoarthritis Right hip   Admission Diagnoses:  Past Medical History  Diagnosis Date  . Hyperlipidemia   . Arthritis   . GERD (gastroesophageal reflux disease)    Discharge Diagnoses:   Principal Problem:  *OA (osteoarthritis) of hip Active Problems:  [place] Acute blood loss anemia  Postop Hyponatremia  Procedure: Procedure(s) (LRB): TOTAL HIP ARTHROPLASTY (Right)   Consults: None  HPI: Charlotte Burns is a 66 y.o. female with end stage arthritis of her right hip with progressively worsening pain and dysfunction. Pain occurs with activity and rest including pain at night. She has tried analgesics, protected weight bearing and rest without benefit. Pain is too severe to attempt physical therapy. Radiographs demonstrate bone on bone arthritis with subchondral cyst formation. She presents now for right THA.      Laboratory Data: Hospital Outpatient Visit on 02/20/2012  Component Date Value Range Status  . MRSA, PCR 02/20/2012 NEGATIVE  NEGATIVE Final  . Staphylococcus aureus 02/20/2012 NEGATIVE  NEGATIVE Final   Comment:                                 The Xpert SA Assay (FDA                          approved for NASAL specimens                          in patients over 1 years of age),                          is one component of                          a comprehensive surveillance                          program.  Test performance has                          been validated by [employer] for patients greater                          than or equal to 1 year old.                          It is not intended                          to diagnose infection nor to                          guide or monitor treatment.  Marland Kitchen aPTT 02/20/2012 29  24 - 37 seconds Final  . WBC  02/20/2012 6.2  4.0 - 10.5 K/uL Final  . RBC 02/20/2012 4.52  3.87 - 5.11 MIL/uL Final  . Hemoglobin 02/20/2012 14.4  12.0 - 15.0 g/dL Final  . HCT 16/03/9603 43.0  36.0 - 46.0 % Final  . MCV 02/20/2012 95.1  78.0 - 100.0 fL Final  . MCH 02/20/2012 31.9  26.0 - 34.0 pg Final  . MCHC 02/20/2012 33.5  30.0 - 36.0 g/dL Final  . RDW 54/02/8118 12.0  11.5 - 15.5 % Final  . Platelets 02/20/2012 276  150 - 400 K/uL Final  . Sodium 02/20/2012 140  135 - 145 mEq/L Final  . Potassium 02/20/2012 4.4  3.5 - 5.1 mEq/L Final  . Chloride 02/20/2012 103  96 - 112 mEq/L Final  . CO2 02/20/2012 28  19 - 32 mEq/L Final  . Glucose, Bld 02/20/2012 91  70 - 99 mg/dL Final  . BUN 14/78/2956 17  6 - 23 mg/dL Final  . Creatinine, Ser 02/20/2012 0.91  0.50 - 1.10 mg/dL Final  . Calcium 21/30/8657 10.7* 8.4 - 10.5 mg/dL Final  . Total Protein 02/20/2012 7.5  6.0 - 8.3 g/dL Final  . Albumin 84/69/6295 4.3  3.5 - 5.2 g/dL Final  . AST 28/41/3244 28  0 - 37 U/L Final  . ALT 02/20/2012 43* 0 - 35 U/L Final  . Alkaline Phosphatase 02/20/2012 123* 39 - 117 U/L Final  . Total Bilirubin 02/20/2012 0.5  0.3 - 1.2 mg/dL Final  . GFR calc non Af Amer 02/20/2012 63* >90 mL/min Final  . GFR calc Af Amer 02/20/2012 73* >90 mL/min Final   Comment:                                 The eGFR has been calculated                          using the CKD EPI equation.                          This calculation has not been                          validated in all clinical                          situations.                          eGFR's persistently                          <90 mL/min signify                          possible Chronic Kidney Disease.  Marland Kitchen Prothrombin Time 02/20/2012 12.5  11.6 - 15.2 seconds Final  . INR 02/20/2012 0.94  0.00 - 1.49 Final  . Color, Urine 02/20/2012 YELLOW  YELLOW Final  . APPearance 02/20/2012 CLOUDY* CLEAR Final  . Specific Gravity, Urine 02/20/2012 1.012  1.005 - 1.030 Final  . pH 02/20/2012  6.0  5.0 - 8.0 Final  . Glucose, UA 02/20/2012 NEGATIVE  NEGATIVE mg/dL Final  . Hgb urine dipstick 02/20/2012 TRACE* NEGATIVE Final  . Bilirubin Urine 02/20/2012 NEGATIVE  NEGATIVE Final  . Ketones, ur 02/20/2012 NEGATIVE  NEGATIVE mg/dL Final  . Protein, ur 06/06/7251 NEGATIVE  NEGATIVE mg/dL Final  .  Urobilinogen, UA 02/20/2012 0.2  0.0 - 1.0 mg/dL Final  . Nitrite 16/03/9603 NEGATIVE  NEGATIVE Final  . Leukocytes, UA 02/20/2012 LARGE* NEGATIVE Final  . Squamous Epithelial / LPF 02/20/2012 RARE  RARE Final  . WBC, UA 02/20/2012 21-50  <3 WBC/hpf Final  . RBC / HPF 02/20/2012 0-2  <3 RBC/hpf Final  . Bacteria, UA 02/20/2012 RARE  RARE Final    Basename 02/27/12 0343 02/26/12 0420  HGB 11.3* 11.3*    Basename 02/27/12 0343 02/26/12 0420  WBC 7.9 7.5  RBC 3.54* 3.54*  HCT 33.6* 33.3*  PLT 180 189    Basename 02/27/12 0343 02/26/12 0420  NA 136 134*  K 3.6 4.0  CL 102 101  CO2 26 28  BUN 8 10  CREATININE 0.84 0.87  GLUCOSE 173* 146*  CALCIUM 8.9 8.6   No results found for this basename: LABPT:2,INR:2 in the last 72 hours  X-Rays:Dg Hip Complete Right  02/20/2012  *RADIOLOGY REPORT*  Clinical Data: Preop for right hip replacement  RIGHT HIP - COMPLETE 2+ VIEW  Comparison: None.  Findings: Three views of the right hip submitted.  No acute fracture or subluxation.  Significant degenerative changes noted with significant narrowing of superior joint space.  There is significant subchondral sclerosis and cystic changes right acetabulum and right femoral head.  There is remodeling of the femoral head. Mild spurring of the superior acetabulum.  IMPRESSION: No acute fracture or subluxation.  Significant degenerative changes right hip joint as described above.   Original Report Authenticated By: Natasha Mead, M.D.    Dg Pelvis Portable  02/25/2012  *RADIOLOGY REPORT*  Clinical Data: 66 year old female status post right total hip.  PORTABLE PELVIS  Comparison: 02/20/2012.  Findings:  Portable AP view 1730 hours.  Interval right bipolar hip arthroplasty.  Visualized hardware appears intact normally aligned. Postoperative drain.  No unexpected osseous changes.  IMPRESSION: Right hip arthroplasty with no adverse features identified.   Original Report Authenticated By: Harley Hallmark, M.D.    Dg Hip Portable 1 View Right  02/25/2012  *RADIOLOGY REPORT*  Clinical Data: 66 year old female status post right hip arthroplasty.  PORTABLE RIGHT HIP - 1 VIEW  Comparison: 1730 hours the same day and earlier.  Findings: AP portable view at 1730 hours.  Bipolar right hip arthroplasty.  Hardware components appear intact normally aligned. Postoperative drain in place.  No unexpected osseous changes.  IMPRESSION: Right hip arthroplasty with no adverse features.   Original Report Authenticated By: Harley Hallmark, M.D.     EKG:No orders found for this or any previous visit.   Hospital Course: Patient was admitted to [facility] and taken to the OR and underwent the above state procedure without complications.  Patient tolerated the procedure well and was later transferred to the recovery room and then to the orthopaedic floor for postoperative care.  They were given PO and IV analgesics for pain control following their surgery.  They were given 24 hours of postoperative antibiotics and started on DVT prophylaxis in the form of Xarelto.   PT and OT were ordered for total hip protocol.  The patient was allowed to be WBAT with therapy. Discharge planning was consulted to help with postop disposition and equipment needs.  Patient had a good night on the evening of surgery and started to get up OOB with therapy on day one.  Hemovac drain was pulled without difficulty.  The knee immobilizer was removed and discontinued.  Continued to work with therapy into  day two.  Dressing was changed on day two and the incision was healing well.  Patient was seen in rounds and was ready to go home later that afternoon  on POD 2.  Discharge Medications: Prior to Admission medications   Medication Sig Start Date End Date Taking? Authorizing Provider  gabapentin (NEURONTIN) 300 MG capsule Take 600 mg by mouth at bedtime.   Yes Historical Provider, MD  HYDROcodone-acetaminophen (NORCO/VICODIN) 5-325 MG per tablet Take 1-2 tablets by mouth every 4 (four) hours as needed ( mild to moderate pain). 02/27/12   Alexzandrew Julien Girt, PA  methocarbamol (ROBAXIN) 500 MG tablet Take 1 tablet (500 mg total) by mouth every 6 (six) hours as needed. 02/27/12   Alexzandrew Julien Girt, PA  ranitidine (ZANTAC) 150 MG tablet Take 150 mg by mouth every morning.   Yes Historical Provider, MD  rivaroxaban (XARELTO) 10 MG TABS tablet Take 1 tablet (10 mg total) by mouth daily with breakfast. Take Xarelto for two and a half more weeks, then discontinue Xarelto. 02/27/12   Alexzandrew Julien Girt, PA  traMADol (ULTRAM) 50 MG tablet Take 100 mg by mouth daily as needed. For pain   Yes Historical Provider, MD    Diet: Cardiac diet Activity:WBAT No bending hip over 90 degrees- A "L" Angle Do not cross legs Do not let foot roll inward When turning these patients a pillow should be placed between the patient's legs to prevent crossing. Patients should have the affected knee fully extended when trying to sit or stand from all surfaces to prevent excessive hip flexion. When ambulating and turning toward the affected side the affected leg should have the toes turned out prior to moving the walker and the rest of patient's body as to prevent internal rotation/ turning in of the leg. Abduction pillows are the most effective way to prevent a patient from not crossing legs or turning toes in at rest. If an abduction pillow is not ordered placing a regular pillow length wise between the patient's legs is also an effective reminder. It is imperative that these precautions be maintained so that the surgical hip does not dislocate. Follow-up:in 2  weeks Disposition - Home Discharged Condition: good   Discharge Orders    Future Orders Please Complete By Expires   Diet - low sodium heart healthy      Call MD / Call 911      Comments:   If you experience chest pain or shortness of breath, CALL 911 and be transported to the hospital emergency room.  If you develope a fever above 101 F, pus (white drainage) or increased drainage or redness at the wound, or calf pain, call your surgeon's office.   Discharge instructions      Comments:   Pick up stool softner and laxative for home. Do not submerge incision under water. May shower. Continue to use ice for pain and swelling from surgery. Hip precautions.  Total Hip Protocol.  Take Xarelto for two and a half more weeks, then discontinue Xarelto.   Constipation Prevention      Comments:   Drink plenty of fluids.  Prune juice may be helpful.  You may use a stool softener, such as Colace (over the counter) 100 mg twice a day.  Use MiraLax (over the counter) for constipation as needed.   Increase activity slowly as tolerated      Patient may shower      Comments:   You may shower without a dressing once there is no drainage.  Do not wash over the wound.  If drainage remains, do not shower until drainage stops.   Driving restrictions      Comments:   No driving until released by the physician.   Lifting restrictions      Comments:   No lifting until released by the physician.   Follow the hip precautions as taught in Physical Therapy      Change dressing      Comments:   You may change your dressing dressing daily with sterile 4 x 4 inch gauze dressing and paper tape.  Do not submerge the incision under water.   TED hose      Comments:   Use stockings (TED hose) for 3 weeks on both leg(s).  You may remove them at night for sleeping.   Do not sit on low chairs, stoools or toilet seats, as it may be difficult to get up from low surfaces          Medication List     As of  02/27/2012  9:11 AM    STOP taking these medications         diclofenac 75 MG EC tablet   Commonly known as: VOLTAREN      TAKE these medications         gabapentin 300 MG capsule   Commonly known as: NEURONTIN   Take 600 mg by mouth at bedtime.      HYDROcodone-acetaminophen 5-325 MG per tablet   Commonly known as: NORCO/VICODIN   Take 1-2 tablets by mouth every 4 (four) hours as needed ( mild to moderate pain).      methocarbamol 500 MG tablet   Commonly known as: ROBAXIN   Take 1 tablet (500 mg total) by mouth every 6 (six) hours as needed.      ranitidine 150 MG tablet   Commonly known as: ZANTAC   Take 150 mg by mouth every morning.      rivaroxaban 10 MG Tabs tablet   Commonly known as: XARELTO   Take 1 tablet (10 mg total) by mouth daily with breakfast. Take Xarelto for two and a half more weeks, then discontinue Xarelto.      traMADol 50 MG tablet   Commonly known as: ULTRAM   Take 100 mg by mouth daily as needed. For pain           Follow-up Information    Follow up with Loanne Drilling, MD. Schedule an appointment as soon as possible for a visit in 2 weeks.   Contact information:   [facility] [address] [place] [address] [place] [address] [phone]          Signed: Patrica Duel 02/27/2012, 9:11 AM

## 2012-02-27 NOTE — Progress Notes (Signed)
Physical Therapy Treatment Patient Details Name: Charlotte Burns MRN: [id number] DOB: 1946-03-15 Today's Date: 02/27/2012 Time: 7829-5621 PT Time Calculation (min): 21 min  PT Assessment / Plan / Recommendation Comments on Treatment Session  Pt ambulated, performed stairs, and performed exercises.  Pt feels ready for d/c home.  Also reviewed car transfer technique to maintain hip precautions.    Follow Up Recommendations  Home health PT    Barriers to Discharge        Equipment Recommendations  None recommended by PT;None recommended by OT    Recommendations for Other Services    Frequency     Plan Discharge plan remains appropriate;Frequency remains appropriate    Precautions / Restrictions Precautions Precautions: Posterior Hip Precaution Comments: reviewed THPs. Pt able to state 2/3 on her own and the third with min question cues Restrictions Weight Bearing Restrictions: No RLE Weight Bearing: Weight bearing as tolerated   Pertinent Vitals/Pain 4/10 L hip, repositioned    Mobility  Transfers Transfers: Stand to Sit;Sit to Stand Sit to Stand: 5: Supervision Stand to Sit: 5: Supervision Details for Transfer Assistance: verbal cues for hip precautions Ambulation/Gait Ambulation/Gait Assistance: 4: Min guard Ambulation Distance (Feet): 120 Feet Assistive device: Rolling walker Ambulation/Gait Assistance Details: verbal cue for posture and safe RW distance Gait Pattern: Step-to pattern;Decreased stance time - right;Antalgic Gait velocity: decreased Stairs: Yes Stairs Assistance: 4: Min guard Stairs Assistance Details (indicate cue type and reason): verbal cue for sequence, pt felt comfortable with one time practice and agreed to have another person present to hold RW when d/c home Stair Management Technique: Backwards;Step to pattern;With walker Number of Stairs: 3     Exercises Total Joint Exercises Ankle Circles/Pumps: AROM;Both;20 reps Quad Sets: AROM;Both;20  reps Gluteal Sets: AROM;Both;20 reps Short Arc Quad: AROM;Strengthening;Right;20 reps Heel Slides: AROM;Right;Strengthening;20 reps;Other (comment) (within precautions) Hip ABduction/ADduction: AROM;Strengthening;Right;15 reps   PT Diagnosis:    PT Problem List:   PT Treatment Interventions:     PT Goals Acute Rehab PT Goals PT Goal: Sit to Stand - Progress: Progressing toward goal PT Goal: Stand to Sit - Progress: Progressing toward goal PT Goal: Ambulate - Progress: Progressing toward goal PT Goal: Up/Down Stairs - Progress: Progressing toward goal PT Goal: Perform Home Exercise Program - Progress: Progressing toward goal  Visit Information  Last PT Received On: 02/27/12 Assistance Needed: +1    Subjective Data  Subjective: I go up backwards right?  (stairs)   Cognition  Overall Cognitive Status: Appears within functional limits for tasks assessed/performed Arousal/Alertness: Awake/alert Orientation Level: Appears intact for tasks assessed Behavior During Session: [facility] for tasks performed    Balance     End of Session PT - End of Session Activity Tolerance: Patient tolerated treatment well Patient left: in chair;with call bell/phone within reach   GP     Hazyl Marseille,KATHrine E 02/27/2012, 11:43 AM Pager: [phone]

## 2012-02-27 NOTE — Evaluation (Signed)
Occupational Therapy Evaluation Patient Details Name: Charlotte Burns MRN: [id number] DOB: 07-05-1945 Today's Date: 02/27/2012 Time: 0840-0900 OT Time Calculation (min): 20 min  OT Assessment / Plan / Recommendation Clinical Impression  Pt is s/p R THA and displays some soreness, decreased independence with hip precautions during ADl and will benefit from skilled OT services to improve ADL independence.     OT Assessment  Patient needs continued OT Services    Follow Up Recommendations  Home health OT;Supervision/Assistance - 24 hour    Barriers to Discharge      Equipment Recommendations  None recommended by PT;None recommended by OT    Recommendations for Other Services    Frequency  Min 2X/week    Precautions / Restrictions Precautions Precautions: Posterior Hip Precaution Comments: reviewed THPs. Pt able to state 2/3 on her own and the third with min question cues Restrictions RLE Weight Bearing: Weight bearing as tolerated        ADL  Eating/Feeding: Simulated;Independent Where Assessed - Eating/Feeding: Chair Grooming: Simulated;Set up Where Assessed - Grooming: Supported sitting Upper Body Bathing: Simulated;Chest;Right arm;Left arm;Abdomen;Set up Where Assessed - Upper Body Bathing: Unsupported sitting Lower Body Bathing: Simulated;Moderate assistance;Other (comment) (without AE) Where Assessed - Lower Body Bathing: Supported sit to stand Upper Body Dressing: Simulated;Set up Where Assessed - Upper Body Dressing: Unsupported sitting Lower Body Dressing: Simulated;Moderate assistance;Other (comment) (without AE) Where Assessed - Lower Body Dressing: Supported sit to stand Toilet Transfer: Performed;Min Pension scheme manager Method: Other (comment) (min verbal cues for THPs and hand placement) Toilet Transfer Equipment: Raised toilet seat with arms (or 3-in-1 over toilet) Toileting - Clothing Manipulation and Hygiene: Simulated;Min guard Where Assessed -  Engineer, mining and Hygiene: Standing Tub/Shower Transfer: Performed;Minimal assistance Tub/Shower Transfer Method: Other (comment) (step back over ledge with RW and out) Equipment Used: Rolling walker;Sock aid;Long-handled shoe horn;Long-handled sponge;Reacher ADL Comments: Educated pt on all AE pieces and coverage. Pt is considering purchasing AE versus husband assisting. Recommend HHOT to reinforce THPs and ADL.     OT Diagnosis: Generalized weakness  OT Problem List: Decreased strength;Decreased knowledge of use of DME or AE;Decreased knowledge of precautions OT Treatment Interventions: Self-care/ADL training;Therapeutic activities;DME and/or AE instruction;Patient/family education   OT Goals Acute Rehab OT Goals OT Goal Formulation: With patient Time For Goal Achievement: 03/05/12 Potential to Achieve Goals: Good ADL Goals Pt Will Perform Grooming: with supervision;Standing at sink ADL Goal: Grooming - Progress: Goal set today Pt Will Perform Lower Body Bathing: with supervision;Sit to stand from chair;Sit to stand from bed;with adaptive equipment ADL Goal: Lower Body Bathing - Progress: Goal set today Pt Will Perform Lower Body Dressing: with supervision;Sit to stand from chair;Sit to stand from bed;with adaptive equipment ADL Goal: Lower Body Dressing - Progress: Goal set today Pt Will Transfer to Toilet: with supervision;Ambulation;with DME;3-in-1;Maintaining hip precautions ADL Goal: Toilet Transfer - Progress: Goal set today Pt Will Perform Toileting - Clothing Manipulation: with supervision;Standing ADL Goal: Toileting - Clothing Manipulation - Progress: Goal set today Pt Will Perform Tub/Shower Transfer: with supervision;with DME;Maintaining hip precautions ADL Goal: Tub/Shower Transfer - Progress: Goal set today  Visit Information  Last OT Received On: 02/27/12 Assistance Needed: +1    Subjective Data  Subjective: I am doing pretty good Patient Stated  Goal: home   Prior Functioning  Vision/Perception  Home Living Lives With: Spouse;Son Available Help at Discharge: Family Type of Home: House Home Access: Stairs to enter Secretary/administrator of Steps: 3 Entrance Stairs-Rails: Right Home Layout: One level Bathroom  Shower/Tub: Health visitor: Standard Home Adaptive Equipment: Bedside commode/3-in-1;Walker - rolling;Straight cane;Reacher Prior Function Level of Independence: Independent with assistive device(s) Communication Communication: No difficulties      Cognition  Overall Cognitive Status: Appears within functional limits for tasks assessed/performed Arousal/Alertness: Awake/alert Orientation Level: Appears intact for tasks assessed Behavior During Session: [facility] for tasks performed    Extremity/Trunk Assessment Right Upper Extremity Assessment RUE ROM/Strength/Tone: [facility] for tasks assessed Left Upper Extremity Assessment LUE ROM/Strength/Tone: [facility] for tasks assessed   Mobility  Shoulder Instructions  Transfers Transfers: Sit to Stand;Stand to Sit Sit to Stand: 4: Min guard;With upper extremity assist;From chair/3-in-1 Stand to Sit: 4: Min guard;With upper extremity assist;To chair/3-in-1 Details for Transfer Assistance: min verbal cues for hand placement and hip precautions       Exercise     Balance     End of Session OT - End of Session Activity Tolerance: Patient tolerated treatment well;Other (comment) (slight nausea after activity. better with time) Patient left: in chair;with call bell/phone within reach  GO     Lennox Laity [phone] 02/27/2012, 9:10 AM

## 2012-02-27 NOTE — Progress Notes (Signed)
Discharge summary sent to payer through [facility]  

## 2012-02-27 NOTE — Discharge Instructions (Signed)
Dr. Ollen Gross Total Joint Specialist [facility] [address] [place], [place] [address] [phone]   TOTAL HIP REPLACEMENT POSTOPERATIVE DIRECTIONS    Hip Rehabilitation, Guidelines Following Surgery  The results of a hip operation are greatly improved after range of motion and muscle strengthening exercises. Follow all safety measures which are given to protect your hip. If any of these exercises cause increased pain or swelling in your joint, decrease the amount until you are comfortable again. Then slowly increase the exercises. Call your caregiver if you have problems or questions.  HOME CARE INSTRUCTIONS  Most of the following instructions are designed to prevent the dislocation of your new hip.  Remove items at home which could result in a fall. This includes throw rugs or furniture in walking pathways.  Continue medications as instructed at time of discharge.  You may have some home medications which will be placed on hold until you complete the course of blood thinner medication.  You may start showering once you are discharged home but do not submerge the incision under water. Just pat the incision dry and apply a dry gauze dressing on daily. Do not put on socks or shoes without following the instructions of your caregivers.  Sit on high chairs so your hips are not bent more than 90 degrees.  Sit on chairs with arms. Use the chair arms to help push yourself up when arising.  Keep your leg on the side of the operation out in front of you when standing up.  Arrange for the use of a toilet seat elevator so you are not sitting low.  Do not do any exercises or get in any positions that cause your toes to point in (pigeon toed).  Always sleep with a pillow between your legs. Do not lie on your side in sleep with both knees touching the bed.   Walk with walker as instructed.  You may resume a sexual relationship in one month or when given the OK by  your caregiver.  Use walker as long as suggested by your caregivers.  You may put full weight on your legs and walk as much as is comfortable. Avoid periods of inactivity such as sitting longer than an hour when not asleep. This helps prevent blood clots.  You may return to work once you are cleared by Designer, industrial/product.  Do not drive a car for 6 weeks or until released by your surgeon.  Do not drive while taking narcotics.  Wear elastic stockings for three weeks following surgery during the day but you may remove then at night.  Make sure you keep all of your appointments after your operation with all of your doctors and caregivers. You should call the office at the above phone number and make an appointment for approximately two weeks after the date of your surgery. Change the dressing daily and reapply a dry dressing each time. Please pick up a stool softener and laxative for home use as long as you are requiring pain medications.  Continue to use ice on the hip for pain and swelling from surgery. You may notice swelling that will progress down to the foot and ankle.  This is normal after  surgery.  Elevate the leg when you are not up walking on it.   It is important for you to complete the blood thinner medication as prescribed by your doctor.  Continue to use the breathing machine which will help keep your temperature down.  It  is common for your temperature to cycle up and down following surgery, especially at night when you are not up moving around and exerting yourself.  The breathing machine keeps your lungs expanded and your temperature down.  RANGE OF MOTION AND STRENGTHENING EXERCISES  These exercises are designed to help you keep full movement of your hip joint. Follow your caregiver's or physical therapist's instructions. Perform all exercises about fifteen times, three times per day or as directed. Exercise both hips, even if you have had only one joint replacement. These exercises can be  done on a training (exercise) mat, on the floor, on a table or on a bed. Use whatever works the best and is most comfortable for you. Use music or television while you are exercising so that the exercises are a pleasant break in your day. This will make your life better with the exercises acting as a break in routine you can look forward to.  Lying on your back, slowly slide your foot toward your buttocks, raising your knee up off the floor. Then slowly slide your foot back down until your leg is straight again.  Lying on your back spread your legs as far apart as you can without causing discomfort.  Lying on your side, raise your upper leg and foot straight up from the floor as far as is comfortable. Slowly lower the leg and repeat.  Lying on your back, tighten up the muscle in the front of your thigh (quadriceps muscles). You can do this by keeping your leg straight and trying to raise your heel off the floor. This helps strengthen the largest muscle supporting your knee.  Lying on your back, tighten up the muscles of your buttocks both with the legs straight and with the knee bent at a comfortable angle while keeping your heel on the floor.   SKILLED REHAB INSTRUCTIONS: If the patient is transferred to a skilled rehab facility following release from the hospital, a list of the current medications will be sent to the facility for the patient to continue.  When discharged from the skilled rehab facility, please have the facility set up the patient's Home Health Physical Therapy prior to being released. Also, the skilled facility will be responsible for providing the patient with their medications at time of release from the facility to include their pain medication, the muscle relaxants, and their blood thinner medication. If the patient is still at the rehab facility at time of the two week follow up appointment, the skilled rehab facility will also need to assist the patient in arranging follow up  appointment in our office and any transportation needs.  MAKE SURE YOU:  Understand these instructions.  Will watch your condition.  Will get help right away if you are not doing well or get worse.   Pick up stool softner and laxative for home. Do not submerge incision under water. May shower. Continue to use ice for pain and swelling from surgery. Hip precautions.  Total Hip Protocol.  Take Xarelto for two and a half more weeks, then discontinue Xarelto.

## 2012-02-27 NOTE — Progress Notes (Signed)
   Subjective: 2 Days Post-Op Procedure(s) (LRB): TOTAL HIP ARTHROPLASTY (Right) Patient reports pain as mild.   Patient seen in rounds for Dr. Lequita Halt. Patient is well, and has had no acute complaints or problems Patient is ready to go home later this afternoon after two sessions of therapy  Objective: Vital signs in last 24 hours: Temp:  [97.8 F (36.6 C)-99.8 F (37.7 C)] 98 F (36.7 C) (09/25 0604) Pulse Rate:  [74-96] 86  (09/25 0604) Resp:  [12-16] 16  (09/25 0604) BP: (107-128)/(64-77) 128/77 mmHg (09/25 0604) SpO2:  [92 %-99 %] 95 % (09/25 0604)  Intake/Output from previous day:  Intake/Output Summary (Last 24 hours) at 02/27/12 0905 Last data filed at 02/27/12 4098  Gross per 24 hour  Intake 3607.5 ml  Output   2450 ml  Net 1157.5 ml    Intake/Output this shift:    Labs:  Basename 02/27/12 0343 02/26/12 0420  HGB 11.3* 11.3*    Basename 02/27/12 0343 02/26/12 0420  WBC 7.9 7.5  RBC 3.54* 3.54*  HCT 33.6* 33.3*  PLT 180 189    Basename 02/27/12 0343 02/26/12 0420  NA 136 134*  K 3.6 4.0  CL 102 101  CO2 26 28  BUN 8 10  CREATININE 0.84 0.87  GLUCOSE 173* 146*  CALCIUM 8.9 8.6   No results found for this basename: LABPT:2,INR:2 in the last 72 hours  EXAM: General - Patient is Alert, Appropriate and Oriented Extremity - Neurovascular intact Sensation intact distally Dorsiflexion/Plantar flexion intact No cellulitis present Incision - clean, dry, no drainage, healing Motor Function - intact, moving foot and toes well on exam.   Assessment/Plan: 2 Days Post-Op Procedure(s) (LRB): TOTAL HIP ARTHROPLASTY (Right) Procedure(s) (LRB): TOTAL HIP ARTHROPLASTY (Right) Past Medical History  Diagnosis Date  . Hyperlipidemia   . Arthritis   . GERD (gastroesophageal reflux disease)    Principal Problem:  *OA (osteoarthritis) of hip Active Problems:  [place] Acute blood loss anemia  Postop Hyponatremia   Up with therapy Discharge home with  home health Diet - Cardiac diet Follow up - in 2 weeks Activity - WBAT Disposition - Home Condition Upon Discharge - Good D/C Meds - See DC Summary DVT Prophylaxis - Xarelto  PERKINS, ALEXZANDREW 02/27/2012, 9:05 AM

## 2012-04-09 ENCOUNTER — Ambulatory Visit: Payer: Self-pay | Admitting: Ophthalmology

## 2012-04-28 ENCOUNTER — Ambulatory Visit: Payer: Self-pay | Admitting: Surgery

## 2012-04-29 HISTORY — PX: BREAST BIOPSY: SHX20

## 2012-04-29 LAB — PATHOLOGY REPORT

## 2012-05-26 ENCOUNTER — Ambulatory Visit: Payer: Self-pay | Admitting: Ophthalmology

## 2012-10-13 ENCOUNTER — Ambulatory Visit: Payer: Self-pay | Admitting: Surgery

## 2013-03-27 ENCOUNTER — Ambulatory Visit: Payer: Self-pay | Admitting: Family Medicine

## 2013-12-31 ENCOUNTER — Other Ambulatory Visit: Payer: Self-pay | Admitting: Orthopedic Surgery

## 2014-01-03 ENCOUNTER — Other Ambulatory Visit: Payer: Self-pay | Admitting: Orthopedic Surgery

## 2014-01-11 ENCOUNTER — Encounter (HOSPITAL_COMMUNITY): Payer: Self-pay | Admitting: Pharmacy Technician

## 2014-01-12 ENCOUNTER — Other Ambulatory Visit (HOSPITAL_COMMUNITY): Payer: Self-pay | Admitting: Orthopedic Surgery

## 2014-01-12 NOTE — Progress Notes (Signed)
LOV Dr Clayborn Bigness 6/15, eccho 8/15, ekg 7/15on chart

## 2014-01-13 ENCOUNTER — Encounter (HOSPITAL_COMMUNITY): Payer: Self-pay

## 2014-01-13 ENCOUNTER — Inpatient Hospital Stay (HOSPITAL_COMMUNITY)
Admission: RE | Admit: 2014-01-13 | Discharge: 2014-01-13 | Disposition: A | Discharge status: Home / Self Care | Payer: Medicare Other | Source: Ambulatory Visit

## 2014-01-13 HISTORY — DX: Shortness of breath: R06.02

## 2014-01-13 HISTORY — DX: Other chest pain: R07.89

## 2014-01-19 ENCOUNTER — Encounter (HOSPITAL_COMMUNITY): Payer: Self-pay

## 2014-01-19 ENCOUNTER — Ambulatory Visit (HOSPITAL_COMMUNITY)
Admission: RE | Admit: 2014-01-19 | Discharge: 2014-01-19 | Disposition: A | Discharge status: Home / Self Care | Payer: Medicare HMO | Source: Ambulatory Visit | Attending: Anesthesiology | Admitting: Anesthesiology

## 2014-01-19 ENCOUNTER — Encounter (HOSPITAL_COMMUNITY)
Admission: RE | Admit: 2014-01-19 | Discharge: 2014-01-19 | Disposition: A | Discharge status: Home / Self Care | Payer: Medicare HMO | Source: Ambulatory Visit | Attending: Orthopedic Surgery | Admitting: Orthopedic Surgery

## 2014-01-19 ENCOUNTER — Encounter (INDEPENDENT_AMBULATORY_CARE_PROVIDER_SITE_OTHER): Payer: Self-pay

## 2014-01-19 DIAGNOSIS — Z01818 Encounter for other preprocedural examination: Secondary | ICD-10-CM | POA: Insufficient documentation

## 2014-01-19 HISTORY — DX: Pneumonia, unspecified organism: J18.9

## 2014-01-19 HISTORY — DX: Personal history of other endocrine, nutritional and metabolic disease: Z86.39

## 2014-01-19 HISTORY — DX: Unspecified osteoarthritis, unspecified site: M19.90

## 2014-01-19 HISTORY — DX: Personal history of other infectious and parasitic diseases: Z86.19

## 2014-01-19 LAB — URINALYSIS, ROUTINE W REFLEX MICROSCOPIC
Bilirubin Urine: NEGATIVE
Glucose, UA: NEGATIVE mg/dL
Ketones, ur: NEGATIVE mg/dL
Nitrite: NEGATIVE
Protein, ur: NEGATIVE mg/dL
Specific Gravity, Urine: 1.015 (ref 1.005–1.030)
Urobilinogen, UA: 0.2 mg/dL (ref 0.0–1.0)
pH: 5.5 (ref 5.0–8.0)

## 2014-01-19 LAB — COMPREHENSIVE METABOLIC PANEL
ALT: 38 U/L — ABNORMAL HIGH (ref 0–35)
AST: 27 U/L (ref 0–37)
Albumin: 3.9 g/dL (ref 3.5–5.2)
Alkaline Phosphatase: 115 U/L (ref 39–117)
Anion gap: 12 (ref 5–15)
BUN: 17 mg/dL (ref 6–23)
CO2: 23 mEq/L (ref 19–32)
Calcium: 10 mg/dL (ref 8.4–10.5)
Chloride: 105 mEq/L (ref 96–112)
Creatinine, Ser: 0.99 mg/dL (ref 0.50–1.10)
GFR calc Af Amer: 65 mL/min — ABNORMAL LOW (ref >90)
GFR calc non Af Amer: 56 mL/min — ABNORMAL LOW (ref >90)
Glucose, Bld: 101 mg/dL — ABNORMAL HIGH (ref 70–99)
Potassium: 4.3 mEq/L (ref 3.7–5.3)
Sodium: 140 mEq/L (ref 137–147)
Total Bilirubin: 0.4 mg/dL (ref 0.3–1.2)
Total Protein: 7.1 g/dL (ref 6.0–8.3)

## 2014-01-19 LAB — CBC
HCT: 40.8 % (ref 36.0–46.0)
Hemoglobin: 13.5 g/dL (ref 12.0–15.0)
MCH: 31.7 pg (ref 26.0–34.0)
MCHC: 33.1 g/dL (ref 30.0–36.0)
MCV: 95.8 fL (ref 78.0–100.0)
Platelets: 204 10*3/uL (ref 150–400)
RBC: 4.26 MIL/uL (ref 3.87–5.11)
RDW: 12.1 % (ref 11.5–15.5)
WBC: 5.3 10*3/uL (ref 4.0–10.5)

## 2014-01-19 LAB — URINE MICROSCOPIC-ADD ON

## 2014-01-19 LAB — SURGICAL PCR SCREEN
MRSA, PCR: POSITIVE — AB
Staphylococcus aureus: POSITIVE — AB

## 2014-01-19 LAB — PROTIME-INR
INR: 0.98 (ref 0.00–1.49)
Prothrombin Time: 13 seconds (ref 11.6–15.2)

## 2014-01-19 LAB — APTT: aPTT: 27 seconds (ref 24–37)

## 2014-01-19 NOTE — Progress Notes (Signed)
Mupirocin 2% ointment called into [facility] on [address]. Positive MRSA and staph results faxed to Dr. Wynelle Link, left message with Claiborne Billings from [facility], called pt to inform of Mupirocin treatment.

## 2014-01-19 NOTE — Progress Notes (Signed)
Stress test 01/05/14 on chart

## 2014-01-19 NOTE — Patient Instructions (Addendum)
[place]  01/19/2014   Your procedure is scheduled on: Monday 01/25/14  Report to [facility] at 07:30 AM.  Call this number if you have problems the morning of surgery 336-: [phone]   Remember:   Do not eat food or drink liquids After Midnight.     Take these medicines the morning of surgery with A SIP OF WATER: zantac, eye drops if needed   Do not wear jewelry, make-up or nail polish.  Do not wear lotions, powders, or perfumes. You may wear deodorant.  Do not shave 48 hours prior to surgery. Men may shave face and neck.  Do not bring valuables to the hospital.  Contacts, dentures or bridgework may not be worn into surgery.  Leave suitcase in the car. After surgery it may be brought to your room.  For patients admitted to the hospital, checkout time is 11:00 AM the day of discharge.    Please read over the following fact sheets that you were given: MRSA Information Paulette Blanch, RN  pre op nurse call if needed [phone]    [facility] - Preparing for Surgery Before surgery, you can play an important role.  Because skin is not sterile, your skin needs to be as free of germs as possible.  You can reduce the number of germs on your skin by washing with CHG (chlorahexidine gluconate) soap before surgery.  CHG is an antiseptic cleaner which kills germs and bonds with the skin to continue killing germs even after washing. Please DO NOT use if you have an allergy to CHG or antibacterial soaps.  If your skin becomes reddened/irritated stop using the CHG and inform your nurse when you arrive at Short Stay. Do not shave (including legs and underarms) for at least 48 hours prior to the first CHG shower.  You may shave your face/neck. Please follow these instructions carefully:  1.  Shower with CHG Soap the night before surgery and the  morning of Surgery.  2.  If you choose to wash your hair, wash your hair first as usual with your  normal  shampoo.  3.   After you shampoo, rinse your hair and body thoroughly to remove the  shampoo.                            4.  Use CHG as you would any other liquid soap.  You can apply chg directly  to the skin and wash                       Gently with a scrungie or clean washcloth.  5.  Apply the CHG Soap to your body ONLY FROM THE NECK DOWN.   Do not use on face/ open                           Wound or open sores. Avoid contact with eyes, ears mouth and genitals (private parts).                       Wash face,  Genitals (private parts) with your normal soap.             6.  Wash thoroughly, paying special attention to the area where your surgery  will be performed.  7.  Thoroughly rinse your body with warm water from the neck  down.  8.  DO NOT shower/wash with your normal soap after using and rinsing off  the CHG Soap.                9.  Pat yourself dry with a clean towel.            10.  Wear clean pajamas.            11.  Place clean sheets on your bed the night of your first shower and do not  sleep with pets. Day of Surgery : Do not apply any lotions/deodorants the morning of surgery.  Please wear clean clothes to the hospital/surgery center.  FAILURE TO FOLLOW THESE INSTRUCTIONS MAY RESULT IN THE CANCELLATION OF YOUR SURGERY PATIENT SIGNATURE_________________________________  NURSE SIGNATURE__________________________________  ________________________________________________________________________  WHAT IS A BLOOD TRANSFUSION? Blood Transfusion Information  A transfusion is the replacement of blood or some of its parts. Blood is made up of multiple cells which provide different functions.  Red blood cells carry oxygen and are used for blood loss replacement.  White blood cells fight against infection.  Platelets control bleeding.  Plasma helps clot blood.  Other blood products are available for specialized needs, such as hemophilia or other clotting disorders. BEFORE THE TRANSFUSION   Who gives blood for transfusions?   Healthy volunteers who are fully evaluated to make sure their blood is safe. This is blood bank blood. Transfusion therapy is the safest it has ever been in the practice of medicine. Before blood is taken from a donor, a complete history is taken to make sure that person has no history of diseases nor engages in risky social behavior (examples are intravenous drug use or sexual activity with multiple partners). The donor's travel history is screened to minimize risk of transmitting infections, such as malaria. The donated blood is tested for signs of infectious diseases, such as HIV and hepatitis. The blood is then tested to be sure it is compatible with you in order to minimize the chance of a transfusion reaction. If you or a relative donates blood, this is often done in anticipation of surgery and is not appropriate for emergency situations. It takes many days to process the donated blood. RISKS AND COMPLICATIONS Although transfusion therapy is very safe and saves many lives, the main dangers of transfusion include:   Getting an infectious disease.  Developing a transfusion reaction. This is an allergic reaction to something in the blood you were given. Every precaution is taken to prevent this. The decision to have a blood transfusion has been considered carefully by your caregiver before blood is given. Blood is not given unless the benefits outweigh the risks. AFTER THE TRANSFUSION  Right after receiving a blood transfusion, you will usually feel much better and more energetic. This is especially true if your red blood cells have gotten low (anemic). The transfusion raises the level of the red blood cells which carry oxygen, and this usually causes an energy increase.  The nurse administering the transfusion will monitor you carefully for complications. HOME CARE INSTRUCTIONS  No special instructions are needed after a transfusion. You may find your energy  is better. Speak with your caregiver about any limitations on activity for underlying diseases you may have. SEEK MEDICAL CARE IF:   Your condition is not improving after your transfusion.  You develop redness or irritation at the intravenous (IV) site. SEEK IMMEDIATE MEDICAL CARE IF:  Any of the following symptoms occur over the next 12 hours:  Shaking chills.  You have a temperature by mouth above 102 F (38.9 C), not controlled by medicine.  Chest, back, or muscle pain.  People around you feel you are not acting correctly or are confused.  Shortness of breath or difficulty breathing.  Dizziness and fainting.  You get a rash or develop hives.  You have a decrease in urine output.  Your urine turns a dark color or changes to pink, red, or brown. Any of the following symptoms occur over the next 10 days:  You have a temperature by mouth above 102 F (38.9 C), not controlled by medicine.  Shortness of breath.  Weakness after normal activity.  The white part of the eye turns yellow (jaundice).  You have a decrease in the amount of urine or are urinating less often.  Your urine turns a dark color or changes to pink, red, or brown. Document Released: 05/18/2000 Document Revised: 08/13/2011 Document Reviewed: 01/05/2008 ExitCare Patient Information 2014 [place].  _______________________________________________________________________  Incentive Spirometer  An incentive spirometer is a tool that can help keep your lungs clear and active. This tool measures how well you are filling your lungs with each breath. Taking long deep breaths may help reverse or decrease the chance of developing breathing (pulmonary) problems (especially infection) following:  A long period of time when you are unable to move or be active. BEFORE THE PROCEDURE   If the spirometer includes an indicator to show your best effort, your nurse or respiratory therapist will set it to a desired  goal.  If possible, sit up straight or lean slightly forward. Try not to slouch.  Hold the incentive spirometer in an upright position. INSTRUCTIONS FOR USE  1. Sit on the edge of your bed if possible, or sit up as far as you can in bed or on a chair. 2. Hold the incentive spirometer in an upright position. 3. Breathe out normally. 4. Place the mouthpiece in your mouth and seal your lips tightly around it. 5. Breathe in slowly and as deeply as possible, raising the piston or the ball toward the top of the column. 6. Hold your breath for 3-5 seconds or for as long as possible. Allow the piston or ball to fall to the bottom of the column. 7. Remove the mouthpiece from your mouth and breathe out normally. 8. Rest for a few seconds and repeat Steps 1 through 7 at least 10 times every 1-2 hours when you are awake. Take your time and take a few normal breaths between deep breaths. 9. The spirometer may include an indicator to show your best effort. Use the indicator as a goal to work toward during each repetition. 10. After each set of 10 deep breaths, practice coughing to be sure your lungs are clear. If you have an incision (the cut made at the time of surgery), support your incision when coughing by placing a pillow or rolled up towels firmly against it. Once you are able to get out of bed, walk around indoors and cough well. You may stop using the incentive spirometer when instructed by your caregiver.  RISKS AND COMPLICATIONS  Take your time so you do not get dizzy or light-headed.  If you are in pain, you may need to take or ask for pain medication before doing incentive spirometry. It is harder to take a deep breath if you are having pain. AFTER USE  Rest and breathe slowly and easily.  It can be helpful to keep track of  a log of your progress. Your caregiver can provide you with a simple table to help with this. If you are using the spirometer at home, follow these instructions: [place] IF:   You are having difficultly using the spirometer.  You have trouble using the spirometer as often as instructed.  Your pain medication is not giving enough relief while using the spirometer.  You develop fever of 100.5 F (38.1 C) or higher. SEEK IMMEDIATE MEDICAL CARE IF:   You cough up bloody sputum that had not been present before.  You develop fever of 102 F (38.9 C) or greater.  You develop worsening pain at or near the incision site. MAKE SURE YOU:   Understand these instructions.  Will watch your condition.  Will get help right away if you are not doing well or get worse. Document Released: 10/01/2006 Document Revised: 08/13/2011 Document Reviewed: 12/02/2006 [facility] Patient Information 2014 [place], [place].   ________________________________________________________________________

## 2014-01-20 NOTE — Progress Notes (Signed)
Pt called back- confirmed she received message about having MRSA and Staph Aureus in her nose - she will pick up Mupirocin and start treatment,

## 2014-01-24 ENCOUNTER — Other Ambulatory Visit: Payer: Self-pay | Admitting: Orthopedic Surgery

## 2014-01-24 NOTE — H&P (Addendum)
Charlotte Burns DOB: 1946/02/18 Married / Language: English / Race: White Female Date of Admission:  01-25-2014 Chief Complaint:  Left Knee Pain History of Present Illness The patient is a 68 year old female who comes in for a preoperative History and Physical. The patient is scheduled for a left total knee arthroplasty to be performed by Dr. Dione Plover. Aluisio, MD at [facility] on 01/25/2014. The patient is being followed for their left knee pain and osteoarthritis. Symptoms reported include: pain, pain with weightbearing and difficulty ambulating. The patient feels that they are doing poorly and report their pain level to be severe. The following medication has been used for pain control: Ultram. She had the shot about 11 months ago and it helped for about 2-3 months. She had been putting up with it for quite some time now. She has more bad days than good at this time. She states the pain has been progressive in nature over the past year. She states that she is almost 68 years old and is worried about being able to get it done in the future. Left knee pain. She has got advanced end stage arthritis left knee, progressively worsening pain and dysfunction. For long term relief, she wants to go ahead and proceed with the left total knee arthroplasty. It is discussed in detail today and she would like to proceed. They have been treated conservatively in the past for the above stated problem and despite conservative measures, they continue to have progressive pain and severe functional limitations and dysfunction. They have failed non-operative management including home exercise, medications, and injections. It is felt that they would benefit from undergoing total joint replacement. Risks and benefits of the procedure have been discussed with the patient and they elect to proceed with surgery. There are no active contraindications to surgery such as ongoing infection or rapidly progressive  neurological disease.  Allergies No Known Drug Allergies  Problem List/Past Medical Knee Pain (719.46  M25.569) Osteoarthritis, Hip (715.35) S/P Right total hip arthroplasty (V43.64) 2013 Osteoarthritis, knee (715.36  M17.9) Cataract Hypercholesterolemia Hypertension Gastroesophageal Reflux Disease Shingles December 2014 Impaired Vision Hemorrhoids Menopause  Family History Rheumatoid Arthritis father and sister Hypertension mother Cancer father and sister Heart Disease sister and brother Heart disease in female family member before age 59 Cerebrovascular Accident mother and sister Diabetes Mellitus First Degree Relatives. 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  Medication History Zantac (150MG  Tablet, Oral) Active. TraMADol HCl (50MG  Tablet, Oral) Active. Diclofenac Sodium (75MG  Tablet DR, Oral) Active. Gabapentin (300MG  Capsule, Oral) Active.   Past Surgical History Dilation and Curettage of Uterus Cataract Extraction-Bilateral Date: 2013. Total Hip Replacement - Right Date: 2013.   Review of Systems  General Not Present- Chills, Fatigue, Fever, Memory Loss, Night Sweats, Weight Gain and Weight Loss. Skin Not Present- Eczema, Hives, Itching, Lesions and Rash. HEENT Not Present- Dentures, Double Vision, Headache, Hearing Loss, Tinnitus and Visual Loss. Respiratory Present- Shortness of breath with exertion.  Not Present- Allergies, Chronic Cough, Coughing up blood and Shortness of breath at  rest. Cardiovascular Not Present- Chest Pain, Difficulty Breathing Lying Down, Murmur, Palpitations, Racing/skipping heartbeats and Swelling. Gastrointestinal Not Present- Abdominal Pain, Bloody Stool, Constipation, Diarrhea, Difficulty Swallowing, Heartburn, Jaundice, Loss of appetitie, Nausea and Vomiting. Female Genitourinary Not Present- Blood in Urine, Discharge, Flank Pain, Incontinence, Painful Urination, Urgency, Urinary frequency, Urinary Retention, Urinating at Night and Weak urinary stream. Musculoskeletal Present- Back Pain, Joint Pain and Morning Stiffness. Not Present- Joint Swelling, Muscle Pain, Muscle Weakness and Spasms. Neurological Not Present- Blackout spells, Difficulty with balance, Dizziness, Paralysis, Tremor and Weakness. Psychiatric Not Present- Insomnia.   Vitals  Weight: 216 lb Height: 67in Weight was reported by patient. Body Surface Area: 2.15 m Body Mass Index: 33.83 kg/m Pulse: 68 (Regular)  Resp.: 14 (Unlabored)  BP: 116/68 (Sitting, Right Arm, Standard)    Physical Exam The physical exam findings are as follows: Note:Patient is a 68 year old female with continued right hip pain. Patient is accompanied today by her husband.  General Mental Status -Alert, cooperative and good historian. General Appearance-pleasant, Not in acute distress. Orientation-Oriented X3. Build & Nutrition-Well nourished and Well developed.  Head and Neck Head-normocephalic, atraumatic . Neck Global Assessment - supple, no bruit auscultated on the right, no bruit auscultated on the left.  Eye Pupil - Bilateral-Regular and Round. Motion - Bilateral-EOMI.  ENMT Note: Partial upper plate   Chest and Lung Exam Auscultation Breath sounds - clear at anterior chest wall and clear at posterior chest wall. Adventitious sounds - No Adventitious  sounds.  Cardiovascular Auscultation Rhythm - Regular rate and rhythm. Heart Sounds - S1 WNL and S2 WNL. Murmurs & Other Heart Sounds - Auscultation of the heart reveals - No Murmurs.  Abdomen Inspection Contour - Generalized mild distention. Palpation/Percussion Tenderness - Abdomen is non-tender to palpation. Rigidity (guarding) - Abdomen is soft. Auscultation Auscultation of the abdomen reveals - Bowel sounds normal.  Female Genitourinary Note: Not done, not pertinent to present illness   Musculoskeletal Note: Musculoskeletal: Right hip can be flexed to 120, rotated in 30, out 40, abduct 40 without discomfort. Left hip has similar motion without discomfort.  Extremities: Left knee shows no effusion. There is moderate crepitus on range of motion. She has got varus deformity. Range about 5 to 125. There is no instability.  X-RAYS: AP pelvis. AP and lateral of the right hip show a prosthesis in excellent position with no periprosthetic abnormalities. X-rays recently of her left knee. She has got bone on bone arthritis medial and patellofemoral.   Assessment & Plan  Osteoarthritis of left knee (715.96  M17.9) Impression: Left Knee Note:Plan is for a Left Total Knee Replacement and a Right Knee Cortisone Injection by Dr. Wynelle Link.  Plan is to go home.  PCP - Dr. Maryland Pink  The patient does not have any contraindications and will receive TXA (tranexamic acid) prior to surgery.  Signed electronically by Joelene Millin, III PA-C

## 2014-01-25 ENCOUNTER — Encounter (HOSPITAL_COMMUNITY): Payer: Medicare HMO | Admitting: Certified Registered Nurse Anesthetist

## 2014-01-25 ENCOUNTER — Inpatient Hospital Stay (HOSPITAL_COMMUNITY): Payer: Medicare HMO | Admitting: Certified Registered Nurse Anesthetist

## 2014-01-25 ENCOUNTER — Inpatient Hospital Stay (HOSPITAL_COMMUNITY)
Admission: RE | Admit: 2014-01-25 | Discharge: 2014-01-27 | DRG: 470 | Disposition: A | Discharge status: Home / Self Care | Payer: Medicare HMO | Source: Ambulatory Visit | Attending: Orthopedic Surgery | Admitting: Orthopedic Surgery

## 2014-01-25 ENCOUNTER — Encounter (HOSPITAL_COMMUNITY): Payer: Self-pay | Admitting: *Deleted

## 2014-01-25 ENCOUNTER — Encounter (HOSPITAL_COMMUNITY)
Admission: RE | Disposition: A | Discharge status: Home / Self Care | Payer: Self-pay | Source: Ambulatory Visit | Attending: Orthopedic Surgery

## 2014-01-25 DIAGNOSIS — Z87891 Personal history of nicotine dependence: Secondary | ICD-10-CM | POA: Diagnosis not present

## 2014-01-25 DIAGNOSIS — M25569 Pain in unspecified knee: Secondary | ICD-10-CM | POA: Diagnosis present

## 2014-01-25 DIAGNOSIS — Z8261 Family history of arthritis: Secondary | ICD-10-CM

## 2014-01-25 DIAGNOSIS — M898X9 Other specified disorders of bone, unspecified site: Secondary | ICD-10-CM | POA: Diagnosis present

## 2014-01-25 DIAGNOSIS — K219 Gastro-esophageal reflux disease without esophagitis: Secondary | ICD-10-CM | POA: Diagnosis present

## 2014-01-25 DIAGNOSIS — E871 Hypo-osmolality and hyponatremia: Secondary | ICD-10-CM | POA: Diagnosis not present

## 2014-01-25 DIAGNOSIS — Z8249 Family history of ischemic heart disease and other diseases of the circulatory system: Secondary | ICD-10-CM

## 2014-01-25 DIAGNOSIS — Z79899 Other long term (current) drug therapy: Secondary | ICD-10-CM

## 2014-01-25 DIAGNOSIS — Z8619 Personal history of other infectious and parasitic diseases: Secondary | ICD-10-CM | POA: Diagnosis not present

## 2014-01-25 DIAGNOSIS — Z9849 Cataract extraction status, unspecified eye: Secondary | ICD-10-CM | POA: Diagnosis not present

## 2014-01-25 DIAGNOSIS — E7800 Pure hypercholesterolemia, unspecified: Secondary | ICD-10-CM | POA: Diagnosis not present

## 2014-01-25 DIAGNOSIS — E669 Obesity, unspecified: Secondary | ICD-10-CM | POA: Diagnosis present

## 2014-01-25 DIAGNOSIS — Z6833 Body mass index (BMI) 33.0-33.9, adult: Secondary | ICD-10-CM | POA: Diagnosis not present

## 2014-01-25 DIAGNOSIS — Z823 Family history of stroke: Secondary | ICD-10-CM | POA: Diagnosis not present

## 2014-01-25 DIAGNOSIS — M1710 Unilateral primary osteoarthritis, unspecified knee: Principal | ICD-10-CM | POA: Diagnosis present

## 2014-01-25 DIAGNOSIS — M179 Osteoarthritis of knee, unspecified: Secondary | ICD-10-CM | POA: Diagnosis present

## 2014-01-25 DIAGNOSIS — Z96649 Presence of unspecified artificial hip joint: Secondary | ICD-10-CM | POA: Diagnosis not present

## 2014-01-25 DIAGNOSIS — Z833 Family history of diabetes mellitus: Secondary | ICD-10-CM | POA: Diagnosis not present

## 2014-01-25 DIAGNOSIS — Z96652 Presence of left artificial knee joint: Secondary | ICD-10-CM

## 2014-01-25 DIAGNOSIS — M1712 Unilateral primary osteoarthritis, left knee: Secondary | ICD-10-CM

## 2014-01-25 HISTORY — PX: TOTAL KNEE ARTHROPLASTY: SHX125

## 2014-01-25 LAB — TYPE AND SCREEN
ABO/RH(D): O POS
Antibody Screen: NEGATIVE

## 2014-01-25 SURGERY — ARTHROPLASTY, KNEE, TOTAL
Anesthesia: Spinal | Site: Knee | Laterality: Left

## 2014-01-25 MED ORDER — GABAPENTIN 300 MG PO CAPS
600.0000 mg | ORAL_CAPSULE | Freq: Every day | ORAL | Status: DC
  Administered 2014-01-25 – 2014-01-26 (×2): 600 mg via ORAL
  Filled 2014-01-25 (×3): qty 2

## 2014-01-25 MED ORDER — BUPIVACAINE HCL 0.25 % IJ SOLN
INTRAMUSCULAR | Status: DC | PRN
  Administered 2014-01-25: 20 mL

## 2014-01-25 MED ORDER — METOCLOPRAMIDE HCL 10 MG PO TABS: 5.0000 mg | ORAL_TABLET | Freq: Three times a day (TID) | ORAL | Status: DC | PRN

## 2014-01-25 MED ORDER — BUPIVACAINE LIPOSOME 1.3 % IJ SUSP
20.0000 mL | Freq: Once | INTRAMUSCULAR | Status: DC
  Filled 2014-01-25: qty 20

## 2014-01-25 MED ORDER — FAMOTIDINE 20 MG PO TABS
20.0000 mg | ORAL_TABLET | Freq: Every day | ORAL | Status: DC
  Administered 2014-01-26 – 2014-01-27 (×2): 20 mg via ORAL
  Filled 2014-01-25 (×2): qty 1

## 2014-01-25 MED ORDER — PHENOL 1.4 % MT LIQD: 1.0000 | OROMUCOSAL | Status: DC | PRN

## 2014-01-25 MED ORDER — DIPHENHYDRAMINE HCL 12.5 MG/5ML PO ELIX: 12.5000 mg | ORAL_SOLUTION | ORAL | Status: DC | PRN

## 2014-01-25 MED ORDER — CEFAZOLIN SODIUM-DEXTROSE 2-3 GM-% IV SOLR
2.0000 g | Freq: Four times a day (QID) | INTRAVENOUS | Status: AC
  Administered 2014-01-25 (×2): 2 g via INTRAVENOUS
  Filled 2014-01-25 (×2): qty 50

## 2014-01-25 MED ORDER — KETOROLAC TROMETHAMINE 15 MG/ML IJ SOLN: 7.5000 mg | Freq: Four times a day (QID) | INTRAMUSCULAR | Status: AC | PRN

## 2014-01-25 MED ORDER — DEXAMETHASONE SODIUM PHOSPHATE 10 MG/ML IJ SOLN
10.0000 mg | Freq: Once | INTRAMUSCULAR | Status: AC
  Administered 2014-01-25: 10 mg via INTRAVENOUS

## 2014-01-25 MED ORDER — MIDAZOLAM HCL 2 MG/2ML IJ SOLN
INTRAMUSCULAR | Status: AC
  Filled 2014-01-25: qty 2

## 2014-01-25 MED ORDER — SODIUM CHLORIDE 0.9 % IV SOLN
INTRAVENOUS | Status: DC
Start: 2014-01-25 — End: 2014-01-25

## 2014-01-25 MED ORDER — ONDANSETRON HCL 4 MG/2ML IJ SOLN
INTRAMUSCULAR | Status: DC | PRN
  Administered 2014-01-25: 4 mg via INTRAVENOUS

## 2014-01-25 MED ORDER — DOCUSATE SODIUM 100 MG PO CAPS
100.0000 mg | ORAL_CAPSULE | Freq: Two times a day (BID) | ORAL | Status: DC
  Administered 2014-01-25 – 2014-01-27 (×4): 100 mg via ORAL

## 2014-01-25 MED ORDER — FENTANYL CITRATE 0.05 MG/ML IJ SOLN
INTRAMUSCULAR | Status: DC | PRN
  Administered 2014-01-25: 50 ug via INTRAVENOUS

## 2014-01-25 MED ORDER — RIVAROXABAN 10 MG PO TABS
10.0000 mg | ORAL_TABLET | Freq: Every day | ORAL | Status: DC
  Administered 2014-01-26 – 2014-01-27 (×2): 10 mg via ORAL
  Filled 2014-01-25 (×4): qty 1

## 2014-01-25 MED ORDER — MIDAZOLAM HCL 5 MG/5ML IJ SOLN
INTRAMUSCULAR | Status: DC | PRN
  Administered 2014-01-25: 2 mg via INTRAVENOUS

## 2014-01-25 MED ORDER — BUPIVACAINE LIPOSOME 1.3 % IJ SUSP
INTRAMUSCULAR | Status: DC | PRN
  Administered 2014-01-25: 20 mL

## 2014-01-25 MED ORDER — FENTANYL CITRATE 0.05 MG/ML IJ SOLN
INTRAMUSCULAR | Status: AC
  Filled 2014-01-25: qty 2

## 2014-01-25 MED ORDER — METHYLPREDNISOLONE ACETATE 40 MG/ML IJ SUSP
INTRAMUSCULAR | Status: DC | PRN
  Administered 2014-01-25: 80 mg

## 2014-01-25 MED ORDER — HYDROMORPHONE HCL PF 1 MG/ML IJ SOLN: 0.2500 mg | INTRAMUSCULAR | Status: DC | PRN

## 2014-01-25 MED ORDER — PROPOFOL 10 MG/ML IV BOLUS
INTRAVENOUS | Status: AC
  Filled 2014-01-25: qty 20

## 2014-01-25 MED ORDER — PROMETHAZINE HCL 25 MG/ML IJ SOLN: 6.2500 mg | INTRAMUSCULAR | Status: DC | PRN

## 2014-01-25 MED ORDER — FLEET ENEMA 7-19 GM/118ML RE ENEM: 1.0000 | ENEMA | Freq: Once | RECTAL | Status: AC | PRN

## 2014-01-25 MED ORDER — BUPIVACAINE HCL (PF) 0.25 % IJ SOLN
INTRAMUSCULAR | Status: AC
  Filled 2014-01-25: qty 30

## 2014-01-25 MED ORDER — ACETAMINOPHEN 650 MG RE SUPP: 650.0000 mg | Freq: Four times a day (QID) | RECTAL | Status: DC | PRN

## 2014-01-25 MED ORDER — DEXAMETHASONE SODIUM PHOSPHATE 10 MG/ML IJ SOLN
INTRAMUSCULAR | Status: AC
  Filled 2014-01-25: qty 1

## 2014-01-25 MED ORDER — SODIUM CHLORIDE 0.9 % IJ SOLN
INTRAMUSCULAR | Status: DC | PRN
  Administered 2014-01-25: 30 mL

## 2014-01-25 MED ORDER — ACETAMINOPHEN 500 MG PO TABS
1000.0000 mg | ORAL_TABLET | Freq: Four times a day (QID) | ORAL | Status: AC
  Administered 2014-01-25 – 2014-01-26 (×4): 1000 mg via ORAL
  Filled 2014-01-25 (×5): qty 2

## 2014-01-25 MED ORDER — VANCOMYCIN HCL IN DEXTROSE 1-5 GM/200ML-% IV SOLN
1000.0000 mg | Freq: Once | INTRAVENOUS | Status: AC
  Administered 2014-01-25: 1000 mg via INTRAVENOUS

## 2014-01-25 MED ORDER — BISACODYL 10 MG RE SUPP: 10.0000 mg | Freq: Every day | RECTAL | Status: DC | PRN

## 2014-01-25 MED ORDER — ONDANSETRON HCL 4 MG/2ML IJ SOLN
INTRAMUSCULAR | Status: AC
  Filled 2014-01-25: qty 2

## 2014-01-25 MED ORDER — SODIUM CHLORIDE 0.9 % IR SOLN
Status: DC | PRN
  Administered 2014-01-25: 1000 mL

## 2014-01-25 MED ORDER — TRAMADOL HCL 50 MG PO TABS: 50.0000 mg | ORAL_TABLET | Freq: Four times a day (QID) | ORAL | Status: DC | PRN

## 2014-01-25 MED ORDER — TRANEXAMIC ACID 100 MG/ML IV SOLN
1000.0000 mg | INTRAVENOUS | Status: AC
  Administered 2014-01-25: 1000 mg via INTRAVENOUS
  Filled 2014-01-25: qty 10

## 2014-01-25 MED ORDER — METOCLOPRAMIDE HCL 5 MG/ML IJ SOLN: 5.0000 mg | Freq: Three times a day (TID) | INTRAMUSCULAR | Status: DC | PRN

## 2014-01-25 MED ORDER — ONDANSETRON HCL 4 MG PO TABS: 4.0000 mg | ORAL_TABLET | Freq: Four times a day (QID) | ORAL | Status: DC | PRN

## 2014-01-25 MED ORDER — SODIUM CHLORIDE 0.9 % IJ SOLN
INTRAMUSCULAR | Status: AC
  Filled 2014-01-25: qty 50

## 2014-01-25 MED ORDER — DEXAMETHASONE SODIUM PHOSPHATE 10 MG/ML IJ SOLN
10.0000 mg | Freq: Every day | INTRAMUSCULAR | Status: AC
  Filled 2014-01-25: qty 1

## 2014-01-25 MED ORDER — DEXAMETHASONE 4 MG PO TABS
10.0000 mg | ORAL_TABLET | Freq: Every day | ORAL | Status: AC
  Administered 2014-01-26: 10 mg via ORAL
  Filled 2014-01-25: qty 1

## 2014-01-25 MED ORDER — SODIUM CHLORIDE 0.9 % IV SOLN
INTRAVENOUS | Status: DC
  Administered 2014-01-25: 23:00:00 via INTRAVENOUS
  Administered 2014-01-25: 1000 mL via INTRAVENOUS
  Administered 2014-01-26: 11:00:00 via INTRAVENOUS

## 2014-01-25 MED ORDER — ONDANSETRON HCL 4 MG/2ML IJ SOLN: 4.0000 mg | Freq: Four times a day (QID) | INTRAMUSCULAR | Status: DC | PRN

## 2014-01-25 MED ORDER — PROPOFOL INFUSION 10 MG/ML OPTIME
INTRAVENOUS | Status: DC | PRN
  Administered 2014-01-25: 50 ug/kg/min via INTRAVENOUS

## 2014-01-25 MED ORDER — BUPIVACAINE IN DEXTROSE 0.75-8.25 % IT SOLN
INTRATHECAL | Status: DC | PRN
  Administered 2014-01-25: 2 mL via INTRATHECAL

## 2014-01-25 MED ORDER — VANCOMYCIN HCL IN DEXTROSE 1-5 GM/200ML-% IV SOLN
INTRAVENOUS | Status: AC
  Filled 2014-01-25: qty 200

## 2014-01-25 MED ORDER — METHOCARBAMOL 500 MG PO TABS
500.0000 mg | ORAL_TABLET | Freq: Four times a day (QID) | ORAL | Status: DC | PRN
  Administered 2014-01-26 – 2014-01-27 (×2): 500 mg via ORAL
  Filled 2014-01-25 (×2): qty 1

## 2014-01-25 MED ORDER — OXYCODONE HCL 5 MG PO TABS
5.0000 mg | ORAL_TABLET | ORAL | Status: DC | PRN
  Administered 2014-01-25: 5 mg via ORAL
  Administered 2014-01-25 (×2): 10 mg via ORAL
  Administered 2014-01-25: 5 mg via ORAL
  Administered 2014-01-26 – 2014-01-27 (×8): 10 mg via ORAL
  Filled 2014-01-25 (×4): qty 2
  Filled 2014-01-25: qty 1
  Filled 2014-01-25 (×7): qty 2
  Filled 2014-01-25: qty 1

## 2014-01-25 MED ORDER — CEFAZOLIN SODIUM-DEXTROSE 2-3 GM-% IV SOLR
INTRAVENOUS | Status: AC
  Filled 2014-01-25: qty 50

## 2014-01-25 MED ORDER — MENTHOL 3 MG MT LOZG: 1.0000 | LOZENGE | OROMUCOSAL | Status: DC | PRN

## 2014-01-25 MED ORDER — ACETAMINOPHEN 325 MG PO TABS: 650.0000 mg | ORAL_TABLET | Freq: Four times a day (QID) | ORAL | Status: DC | PRN

## 2014-01-25 MED ORDER — CEFAZOLIN SODIUM-DEXTROSE 2-3 GM-% IV SOLR
2.0000 g | INTRAVENOUS | Status: AC
Start: 2014-01-25 — End: 2014-01-25
  Administered 2014-01-25: 2 g via INTRAVENOUS

## 2014-01-25 MED ORDER — POLYETHYLENE GLYCOL 3350 17 G PO PACK: 17.0000 g | PACK | Freq: Every day | ORAL | Status: DC | PRN

## 2014-01-25 MED ORDER — MORPHINE SULFATE 2 MG/ML IJ SOLN: 1.0000 mg | INTRAMUSCULAR | Status: DC | PRN

## 2014-01-25 MED ORDER — METHOCARBAMOL 1000 MG/10ML IJ SOLN
500.0000 mg | Freq: Four times a day (QID) | INTRAMUSCULAR | Status: DC | PRN
  Administered 2014-01-25 (×2): 500 mg via INTRAVENOUS
  Filled 2014-01-25 (×2): qty 5

## 2014-01-25 MED ORDER — ACETAMINOPHEN 10 MG/ML IV SOLN
1000.0000 mg | Freq: Once | INTRAVENOUS | Status: DC
  Filled 2014-01-25: qty 100

## 2014-01-25 MED ORDER — LACTATED RINGERS IV SOLN
INTRAVENOUS | Status: DC
  Administered 2014-01-25: 12:00:00 via INTRAVENOUS
  Administered 2014-01-25: 1000 mL via INTRAVENOUS

## 2014-01-25 MED ORDER — LIDOCAINE HCL 1 % IJ SOLN
INTRAMUSCULAR | Status: AC
  Filled 2014-01-25: qty 20

## 2014-01-25 MED ORDER — METHYLPREDNISOLONE ACETATE 40 MG/ML IJ SUSP
INTRAMUSCULAR | Status: AC
  Filled 2014-01-25: qty 2

## 2014-01-25 SURGICAL SUPPLY — 60 items
BAG ZIPLOCK 12X15 (MISCELLANEOUS) ×3 IMPLANT
BANDAGE ELASTIC 6 VELCRO ST LF (GAUZE/BANDAGES/DRESSINGS) ×3 IMPLANT
BANDAGE ESMARK 6X9 LF (GAUZE/BANDAGES/DRESSINGS) ×1 IMPLANT
BLADE SAG 18X100X1.27 (BLADE) ×3 IMPLANT
BLADE SAW SGTL 11.0X1.19X90.0M (BLADE) ×3 IMPLANT
BNDG COHESIVE 2X5 WHT NS (GAUZE/BANDAGES/DRESSINGS) ×3 IMPLANT
BNDG ESMARK 6X9 LF (GAUZE/BANDAGES/DRESSINGS) ×3
BOWL SMART MIX CTS (DISPOSABLE) ×3 IMPLANT
CAPT RP KNEE ×3 IMPLANT
CEMENT HV SMART SET (Cement) ×6 IMPLANT
CLOSURE WOUND 1/2 X4 (GAUZE/BANDAGES/DRESSINGS) ×1
CUFF TOURN SGL QUICK 34 (TOURNIQUET CUFF) ×3
CUFF TRNQT CYL 34X4X40X1 (TOURNIQUET CUFF) ×1 IMPLANT
DECANTER SPIKE VIAL GLASS SM (MISCELLANEOUS) ×3 IMPLANT
DRAPE EXTREMITY T 121X128X90 (DRAPE) ×3 IMPLANT
DRAPE POUCH INSTRU U-SHP 10X18 (DRAPES) ×3 IMPLANT
DRAPE U-SHAPE 47X51 STRL (DRAPES) ×3 IMPLANT
DRSG ADAPTIC 3X8 NADH LF (GAUZE/BANDAGES/DRESSINGS) ×3 IMPLANT
DRSG PAD ABDOMINAL 8X10 ST (GAUZE/BANDAGES/DRESSINGS) IMPLANT
DURAPREP 26ML APPLICATOR (WOUND CARE) ×3 IMPLANT
ELECT REM PT RETURN 9FT ADLT (ELECTROSURGICAL) ×3
ELECTRODE REM PT RTRN 9FT ADLT (ELECTROSURGICAL) ×1 IMPLANT
EVACUATOR 1/8 PVC DRAIN (DRAIN) ×3 IMPLANT
FACESHIELD WRAPAROUND (MASK) ×15 IMPLANT
GAUZE SPONGE 4X4 12PLY STRL (GAUZE/BANDAGES/DRESSINGS) ×3 IMPLANT
GLOVE BIO SURGEON STRL SZ7.5 (GLOVE) IMPLANT
GLOVE BIO SURGEON STRL SZ8 (GLOVE) ×3 IMPLANT
GLOVE BIOGEL PI IND STRL 6.5 (GLOVE) IMPLANT
GLOVE BIOGEL PI IND STRL 8 (GLOVE) ×1 IMPLANT
GLOVE BIOGEL PI INDICATOR 6.5 (GLOVE)
GLOVE BIOGEL PI INDICATOR 8 (GLOVE) ×2
GLOVE SURG SS PI 6.5 STRL IVOR (GLOVE) IMPLANT
GOWN STRL REUS W/TWL LRG LVL3 (GOWN DISPOSABLE) ×3 IMPLANT
GOWN STRL REUS W/TWL XL LVL3 (GOWN DISPOSABLE) IMPLANT
HANDPIECE INTERPULSE COAX TIP (DISPOSABLE) ×3
IMMOBILIZER KNEE 20 (SOFTGOODS) ×3 IMPLANT
IMMOBILIZER KNEE 20 THIGH 36 (SOFTGOODS) IMPLANT
KIT BASIN OR (CUSTOM PROCEDURE TRAY) ×3 IMPLANT
MANIFOLD NEPTUNE II (INSTRUMENTS) ×3 IMPLANT
NDL SAFETY ECLIPSE 18X1.5 (NEEDLE) ×2 IMPLANT
NEEDLE HYPO 18GX1.5 SHARP (NEEDLE) ×6
NS IRRIG 1000ML POUR BTL (IV SOLUTION) ×3 IMPLANT
PACK TOTAL JOINT (CUSTOM PROCEDURE TRAY) ×3 IMPLANT
PAD ABD 8X10 STRL (GAUZE/BANDAGES/DRESSINGS) ×3 IMPLANT
PADDING CAST COTTON 6X4 STRL (CAST SUPPLIES) ×3 IMPLANT
POSITIONER SURGICAL ARM (MISCELLANEOUS) ×3 IMPLANT
SET HNDPC FAN SPRY TIP SCT (DISPOSABLE) ×1 IMPLANT
STRIP CLOSURE SKIN 1/2X4 (GAUZE/BANDAGES/DRESSINGS) ×2 IMPLANT
SUCTION FRAZIER 12FR DISP (SUCTIONS) ×3 IMPLANT
SUT MNCRL AB 4-0 PS2 18 (SUTURE) ×3 IMPLANT
SUT VIC AB 2-0 CT1 27 (SUTURE) ×9
SUT VIC AB 2-0 CT1 TAPERPNT 27 (SUTURE) ×3 IMPLANT
SUT VLOC 180 0 24IN GS25 (SUTURE) ×3 IMPLANT
SYRINGE 20CC LL (MISCELLANEOUS) ×3 IMPLANT
SYRINGE 60CC LL (MISCELLANEOUS) ×3 IMPLANT
TOWEL OR 17X26 10 PK STRL BLUE (TOWEL DISPOSABLE) ×3 IMPLANT
TOWEL OR NON WOVEN STRL DISP B (DISPOSABLE) IMPLANT
TRAY FOLEY CATH 14FRSI W/METER (CATHETERS) ×3 IMPLANT
WATER STERILE IRR 1500ML POUR (IV SOLUTION) ×3 IMPLANT
WRAP KNEE MAXI GEL POST OP (GAUZE/BANDAGES/DRESSINGS) ×3 IMPLANT

## 2014-01-25 NOTE — Transfer of Care (Signed)
Immediate Anesthesia Transfer of Care Note  Patient: Charlotte Burns  Procedure(s) Performed: Procedure(s) (LRB): LEFT TOTAL KNEE ARTHROPLASTY WITH RIGHT KNEE INJECTION (Left)  Patient Location: PACU  Anesthesia Type: Spinal  Level of Consciousness: sedated, patient cooperative and responds to stimulation  Airway & Oxygen Therapy: Patient Spontanous Breathing and Patient connected to face mask oxgen  Post-op Assessment: Report given to PACU RN and Post -op Vital signs reviewed and stable  Post vital signs: Reviewed and stable  Complications: No apparent anesthesia complications

## 2014-01-25 NOTE — H&P (View-Only) (Signed)
Charlotte Burns. Needs DOB: Mar 09, 1946 Married / Language: English / Race: White Female Date of Admission:  01-25-2014 Chief Complaint:  Left Knee Pain History of Present Illness The patient is a 68 year old female who comes in for a preoperative History and Physical. The patient is scheduled for a left total knee arthroplasty to be performed by Dr. Dione Plover. Aluisio, MD at [facility] on 01/25/2014. The patient is being followed for their left knee pain and osteoarthritis. Symptoms reported include: pain, pain with weightbearing and difficulty ambulating. The patient feels that they are doing poorly and report their pain level to be severe. The following medication has been used for pain control: Ultram. She had the shot about 11 months ago and it helped for about 2-3 months. She had been putting up with it for quite some time now. She has more bad days than good at this time. She states the pain has been progressive in nature over the past year. She states that she is almost 68 years old and is worried about being able to get it done in the future. Left knee pain. She has got advanced end stage arthritis left knee, progressively worsening pain and dysfunction. For long term relief, she wants to go ahead and proceed with the left total knee arthroplasty. It is discussed in detail today and she would like to proceed. They have been treated conservatively in the past for the above stated problem and despite conservative measures, they continue to have progressive pain and severe functional limitations and dysfunction. They have failed non-operative management including home exercise, medications, and injections. It is felt that they would benefit from undergoing total joint replacement. Risks and benefits of the procedure have been discussed with the patient and they elect to proceed with surgery. There are no active contraindications to surgery such as ongoing infection or rapidly progressive  neurological disease.  Allergies No Known Drug Allergies  Problem List/Past Medical Knee Pain (719.46  M25.569) Osteoarthritis, Hip (715.35) S/P Right total hip arthroplasty (V43.64) 2013 Osteoarthritis, knee (715.36  M17.9) Cataract Hypercholesterolemia Hypertension Gastroesophageal Reflux Disease Shingles December 2014 Impaired Vision Hemorrhoids Menopause  Family History Rheumatoid Arthritis father and sister Hypertension mother Cancer father and sister Heart Disease sister and brother Heart disease in female family member before age 80 Cerebrovascular Accident mother and sister Diabetes Mellitus First Degree Relatives. 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  Medication History Zantac (150MG  Tablet, Oral) Active. TraMADol HCl (50MG  Tablet, Oral) Active. Diclofenac Sodium (75MG  Tablet DR, Oral) Active. Gabapentin (300MG  Capsule, Oral) Active.   Past Surgical History Dilation and Curettage of Uterus Cataract Extraction-Bilateral Date: 2013. Total Hip Replacement - Right Date: 2013.   Review of Systems  General Not Present- Chills, Fatigue, Fever, Memory Loss, Night Sweats, Weight Gain and Weight Loss. Skin Not Present- Eczema, Hives, Itching, Lesions and Rash. HEENT Not Present- Dentures, Double Vision, Headache, Hearing Loss, Tinnitus and Visual Loss. Respiratory Present- Shortness of breath with exertion.  Not Present- Allergies, Chronic Cough, Coughing up blood and Shortness of breath at  rest. Cardiovascular Not Present- Chest Pain, Difficulty Breathing Lying Down, Murmur, Palpitations, Racing/skipping heartbeats and Swelling. Gastrointestinal Not Present- Abdominal Pain, Bloody Stool, Constipation, Diarrhea, Difficulty Swallowing, Heartburn, Jaundice, Loss of appetitie, Nausea and Vomiting. Female Genitourinary Not Present- Blood in Urine, Discharge, Flank Pain, Incontinence, Painful Urination, Urgency, Urinary frequency, Urinary Retention, Urinating at Night and Weak urinary stream. Musculoskeletal Present- Back Pain, Joint Pain and Morning Stiffness. Not Present- Joint Swelling, Muscle Pain, Muscle Weakness and Spasms. Neurological Not Present- Blackout spells, Difficulty with balance, Dizziness, Paralysis, Tremor and Weakness. Psychiatric Not Present- Insomnia.   Vitals  Weight: 216 lb Height: 67in Weight was reported by patient. Body Surface Area: 2.15 m Body Mass Index: 33.83 kg/m Pulse: 68 (Regular)  Resp.: 14 (Unlabored)  BP: 116/68 (Sitting, Right Arm, Standard)    Physical Exam The physical exam findings are as follows: Note:Patient is a 68 year old female with continued right hip pain. Patient is accompanied today by her husband.  General Mental Status -Alert, cooperative and good historian. General Appearance-pleasant, Not in acute distress. Orientation-Oriented X3. Build & Nutrition-Well nourished and Well developed.  Head and Neck Head-normocephalic, atraumatic . Neck Global Assessment - supple, no bruit auscultated on the right, no bruit auscultated on the left.  Eye Pupil - Bilateral-Regular and Round. Motion - Bilateral-EOMI.  ENMT Note: Partial upper plate   Chest and Lung Exam Auscultation Breath sounds - clear at anterior chest wall and clear at posterior chest wall. Adventitious sounds - No Adventitious  sounds.  Cardiovascular Auscultation Rhythm - Regular rate and rhythm. Heart Sounds - S1 WNL and S2 WNL. Murmurs & Other Heart Sounds - Auscultation of the heart reveals - No Murmurs.  Abdomen Inspection Contour - Generalized mild distention. Palpation/Percussion Tenderness - Abdomen is non-tender to palpation. Rigidity (guarding) - Abdomen is soft. Auscultation Auscultation of the abdomen reveals - Bowel sounds normal.  Female Genitourinary Note: Not done, not pertinent to present illness   Musculoskeletal Note: Musculoskeletal: Right hip can be flexed to 120, rotated in 30, out 40, abduct 40 without discomfort. Left hip has similar motion without discomfort.  Extremities: Left knee shows no effusion. There is moderate crepitus on range of motion. She has got varus deformity. Range about 5 to 125. There is no instability.  X-RAYS: AP pelvis. AP and lateral of the right hip show a prosthesis in excellent position with no periprosthetic abnormalities. X-rays recently of her left knee. She has got bone on bone arthritis medial and patellofemoral.   Assessment & Plan  Osteoarthritis of left knee (715.96  M17.9) Impression: Left Knee Note:Plan is for a Left Total Knee Replacement and a Right Knee Cortisone Injection by Dr. Wynelle Link.  Plan is to go home.  PCP - Dr. Maryland Pink  The patient does not have any contraindications and will receive TXA (tranexamic acid) prior to surgery.  Signed electronically by Joelene Millin, III PA-C

## 2014-01-25 NOTE — Anesthesia Preprocedure Evaluation (Addendum)
Anesthesia Evaluation  Patient identified by MRN, date of birth, ID band Patient awake    Reviewed: Allergy & Precautions, H&P , NPO status , Patient's Chart, lab work & pertinent test results  Airway Mallampati: II TM Distance: >3 FB Neck ROM: Full    Dental no notable dental hx.    Pulmonary shortness of breath, pneumonia -, resolved, former smoker,  breath sounds clear to auscultation  Pulmonary exam normal       Cardiovascular negative cardio ROS  Rhythm:Regular Rate:Normal     Neuro/Psych negative neurological ROS  negative psych ROS   GI/Hepatic Neg liver ROS, GERD-  Medicated,  Endo/Other  negative endocrine ROS  Renal/GU negative Renal ROS  negative genitourinary   Musculoskeletal negative musculoskeletal ROS (+)   Abdominal (+) + obese,   Peds negative pediatric ROS (+)  Hematology negative hematology ROS (+) anemia ,   Anesthesia Other Findings   Reproductive/Obstetrics negative OB ROS                          Anesthesia Physical Anesthesia Plan  ASA: II  Anesthesia Plan: Spinal   Post-op Pain Management:    Induction: Intravenous  Airway Management Planned:   Additional Equipment:   Intra-op Plan:   Post-operative Plan:   Informed Consent: I have reviewed the patients History and Physical, chart, labs and discussed the procedure including the risks, benefits and alternatives for the proposed anesthesia with the patient or authorized representative who has indicated his/her understanding and acceptance.   Dental advisory given  Plan Discussed with: CRNA  Anesthesia Plan Comments: (Discussed risks/benefits of spinal including headache, backache, failure, bleeding, infection, and nerve damage. Patient consents to spinal. Questions answered. Coagulation studies and platelet count acceptable.)       Anesthesia Quick Evaluation

## 2014-01-25 NOTE — Interval H&P Note (Signed)
History and Physical Interval Note:  01/25/2014 9:32 AM  Charlotte Burns  has presented today for surgery, with the diagnosis of OA OF LEFT KNEE  The various methods of treatment have been discussed with the patient and family. After consideration of risks, benefits and other options for treatment, the patient has consented to  Procedure(s): LEFT TOTAL KNEE ARTHROPLASTY (Left) as a surgical intervention .  The patient's history has been reviewed, patient examined, no change in status, stable for surgery.  I have reviewed the patient's chart and labs.  Questions were answered to the patient's satisfaction.     Gearlean Alf

## 2014-01-25 NOTE — Anesthesia Postprocedure Evaluation (Signed)
  Anesthesia Post-op Note  Patient: Charlotte Burns  Procedure(s) Performed: Procedure(s) (LRB): LEFT TOTAL KNEE ARTHROPLASTY WITH RIGHT KNEE INJECTION (Left)  Patient Location: PACU  Anesthesia Type: Spinal  Level of Consciousness: awake and alert   Airway and Oxygen Therapy: Patient Spontanous Breathing  Post-op Pain: mild  Post-op Assessment: Post-op Vital signs reviewed, Patient's Cardiovascular Status Stable, Respiratory Function Stable, Patent Airway and No signs of Nausea or vomiting  Last Vitals:  Filed Vitals:   01/25/14 1330  BP: 125/67  Pulse: 50  Temp: 36.3 C  Resp: 11    Post-op Vital Signs: stable   Complications: No apparent anesthesia complications

## 2014-01-25 NOTE — Op Note (Signed)
Pre-operative diagnosis- Osteoarthritis  Left knee(s)  Post-operative diagnosis- Osteoarthritis Left knee(s)  Procedure-  Left  Total Knee Arthroplasty  Surgeon- Charlotte Plover. Kimyetta Flott, MD  Assistant- Arlee Muslim, PA-C  Anesthesia-  Spinal  EBL-* No blood loss amount entered *   Drains Hemovac  Tourniquet time-  Total Tourniquet Time Documented: Thigh (Left) - 33 minutes Total: Thigh (Left) - 33 minutes     Complications- None  Condition-PACU - hemodynamically stable.   Brief Clinical Note  Charlotte Burns is a 68 y.o. year old female with end stage OA of her left knee with progressively worsening pain and dysfunction. She has constant pain, with activity and at rest and significant functional deficits with difficulties even with ADLs. She has had extensive non-op management including analgesics, injections of cortisone and viscosupplements, and home exercise program, but remains in significant pain with significant dysfunction. Radiographs show bone on bone arthritis medial and patellofemoral. She presents now for left Total Knee Arthroplasty.    Procedure in detail---   The patient is brought into the operating room and positioned supine on the operating table. After successful administration of  Spinal,   a tourniquet is placed high on the  Left thigh(s) and the lower extremity is prepped and draped in the usual sterile fashion. Time out is performed by the operating team and then the  Left lower extremity is wrapped in Esmarch, knee flexed and the tourniquet inflated to 300 mmHg.       A midline incision is made with a ten blade through the subcutaneous tissue to the level of the extensor mechanism. A fresh blade is used to make a medial parapatellar arthrotomy. Soft tissue over the proximal medial tibia is subperiosteally elevated to the joint line with a knife and into the semimembranosus bursa with a Cobb elevator. Soft tissue over the proximal lateral tibia is elevated with  attention being paid to avoiding the patellar tendon on the tibial tubercle. The patella is everted, knee flexed 90 degrees and the ACL and PCL are removed. Findings are bone on bone all 3 compartments with massive global osteophytes.        The drill is used to create a starting hole in the distal femur and the canal is thoroughly irrigated with sterile saline to remove the fatty contents. The 5 degree Left  valgus alignment guide is placed into the femoral canal and the distal femoral cutting block is pinned to remove 10 mm off the distal femur. Resection is made with an oscillating saw.      The tibia is subluxed forward and the menisci are removed. The extramedullary alignment guide is placed referencing proximally at the medial aspect of the tibial tubercle and distally along the second metatarsal axis and tibial crest. The block is pinned to remove 3mm off the more deficient medial  side. Resection is made with an oscillating saw. Size 4is the most appropriate size for the tibia and the proximal tibia is prepared with the modular drill and keel punch for that size.      The femoral sizing guide is placed and size 4 is most appropriate. Rotation is marked off the epicondylar axis and confirmed by creating a rectangular flexion gap at 90 degrees. The size 4 cutting block is pinned in this rotation and the anterior, posterior and chamfer cuts are made with the oscillating saw. The intercondylar block is then placed and that cut is made.      Trial size 4 tibial component, trial  size 4 narrow posterior stabilized femur and a 10  mm posterior stabilized rotating platform insert trial is placed. Full extension is achieved with excellent varus/valgus and anterior/posterior balance throughout full range of motion. The patella is everted and thickness measured to be 22  mm. Free hand resection is taken to 12 mm, a 38 template is placed, lug holes are drilled, trial patella is placed, and it tracks normally.  Osteophytes are removed off the posterior femur with the trial in place. All trials are removed and the cut bone surfaces prepared with pulsatile lavage. Cement is mixed and once ready for implantation, the size 4 tibial implant, size  4 narrow posterior stabilized femoral component, and the size 38 patella are cemented in place and the patella is held with the clamp. The trial insert is placed and the knee held in full extension. The Exparel (20 ml mixed with 30 ml saline) and .25% Bupivicaine, are injected into the extensor mechanism, posterior capsule, medial and lateral gutters and subcutaneous tissues.  All extruded cement is removed and once the cement is hard the permanent 10 mm posterior stabilized rotating platform insert is placed into the tibial tray.      The wound is copiously irrigated with saline solution and the extensor mechanism closed over a hemovac drain with #1 V-loc suture. The tourniquet is released for a total tourniquet time of 33  minutes. Flexion against gravity is 140 degrees and the patella tracks normally. Subcutaneous tissue is closed with 2.0 vicryl and subcuticular with running 4.0 Monocryl. The incision is cleaned and dried and steri-strips and a bulky sterile dressing are applied. The limb is placed into a knee immobilizer and the patient is awakened and transported to recovery in stable condition.      Please note that a surgical assistant was a medical necessity for this procedure in order to perform it in a safe and expeditious manner. Surgical assistant was necessary to retract the ligaments and vital neurovascular structures to prevent injury to them and also necessary for proper positioning of the limb to allow for anatomic placement of the prosthesis.   Charlotte Plover Zykerria Tanton, MD    01/25/2014, 11:38 AM

## 2014-01-25 NOTE — Anesthesia Procedure Notes (Signed)
Spinal  Patient location during procedure: OR Start time: 01/25/2014 10:32 AM End time: 01/25/2014 10:38 AM Staffing Anesthesiologist: Salley Scarlet CRNA/Resident: Darlys Gales R Performed by: anesthesiologist and resident/CRNA  Preanesthetic Checklist Completed: patient identified, site marked, surgical consent, pre-op evaluation, timeout performed, IV checked, risks and benefits discussed and monitors and equipment checked Spinal Block Patient position: sitting Prep: Betadine Patient monitoring: heart rate, cardiac monitor, continuous pulse ox and blood pressure Approach: midline Location: L2-3 Injection technique: single-shot Needle Needle type: Spinocan  Needle gauge: 22 G Needle length: 9 cm Needle insertion depth: 8 cm Assessment Sensory level: T4 Additional Notes Performed by Darlys Gales CRNA, Dr. Delma Post supervising. CSF freely flowing out of spinal needle, did not draw back, confirmed CSF flow after moving in all quadrants. Injected local. Patient reports heavy legs.

## 2014-01-25 NOTE — Plan of Care (Signed)
Problem: Consults Goal: Diagnosis- Total Joint Replacement Primary Total Knee     

## 2014-01-26 ENCOUNTER — Encounter (HOSPITAL_COMMUNITY): Payer: Self-pay | Admitting: Orthopedic Surgery

## 2014-01-26 LAB — BASIC METABOLIC PANEL
Anion gap: 11 (ref 5–15)
BUN: 12 mg/dL (ref 6–23)
CO2: 22 mEq/L (ref 19–32)
Calcium: 9.3 mg/dL (ref 8.4–10.5)
Chloride: 100 mEq/L (ref 96–112)
Creatinine, Ser: 0.92 mg/dL (ref 0.50–1.10)
GFR calc Af Amer: 71 mL/min — ABNORMAL LOW (ref >90)
GFR calc non Af Amer: 62 mL/min — ABNORMAL LOW (ref >90)
Glucose, Bld: 184 mg/dL — ABNORMAL HIGH (ref 70–99)
Potassium: 4.3 mEq/L (ref 3.7–5.3)
Sodium: 133 mEq/L — ABNORMAL LOW (ref 137–147)

## 2014-01-26 LAB — CBC
HCT: 32.9 % — ABNORMAL LOW (ref 36.0–46.0)
Hemoglobin: 11.2 g/dL — ABNORMAL LOW (ref 12.0–15.0)
MCH: 32.2 pg (ref 26.0–34.0)
MCHC: 34 g/dL (ref 30.0–36.0)
MCV: 94.5 fL (ref 78.0–100.0)
Platelets: 187 10*3/uL (ref 150–400)
RBC: 3.48 MIL/uL — ABNORMAL LOW (ref 3.87–5.11)
RDW: 11.8 % (ref 11.5–15.5)
WBC: 9.7 10*3/uL (ref 4.0–10.5)

## 2014-01-26 NOTE — Progress Notes (Signed)
Physical Therapy Treatment Note   01/26/14 1400  PT Visit Information  Last PT Received On 01/26/14  Assistance Needed +1  History of Present Illness Pt is a 68 year old female s/p L TKA  PT Time Calculation  PT Start Time 1402  PT Stop Time 1413  PT Time Calculation (min) 11 min  Subjective Data  Subjective Pt ambulated again in hallway this afternoon.  Pt mobilizing well and anticipates d/c home tomorrow.  Precautions  Precautions Knee  Required Braces or Orthoses Knee Immobilizer - Left  Knee Immobilizer - Left Discontinue once straight leg raise with < 10 degree lag  Restrictions  Other Position/Activity Restrictions WBAT  Pain Assessment  Pain Assessment 0-10  Pain Score 3  Pain Location L knee  Pain Descriptors / Indicators Aching;Sore  Pain Intervention(s) Limited activity within patient's tolerance;Premedicated before session;Repositioned;Ice applied  Cognition  Arousal/Alertness Awake/alert  Behavior During Therapy WFL for tasks assessed/performed  Overall Cognitive Status Within Functional Limits for tasks assessed  Bed Mobility  Overal bed mobility Needs Assistance  Bed Mobility Supine to Sit;Sit to Supine  Supine to sit Min guard  Sit to supine Min assist  General bed mobility comments verbal cues for technique, assist for L LE onto bed  Transfers  Overall transfer level Needs assistance  Equipment used Rolling walker (2 wheeled)  Transfers Sit to/from Stand  Sit to Stand Min guard  General transfer comment verbal cues for UE and LE positioning  Ambulation/Gait  Ambulation/Gait assistance Min guard  Ambulation Distance (Feet) 60 Feet  Assistive device Rolling walker (2 wheeled)  Gait Pattern/deviations Step-to pattern;Antalgic  General Gait Details verbal cues for step length, RW distance  PT - End of Session  Equipment Utilized During Treatment Left knee immobilizer  Activity Tolerance Patient tolerated treatment well  Patient left in bed;with call  bell/phone within reach;with family/visitor present  PT - Assessment/Plan  PT Plan Current plan remains appropriate  PT Frequency 7X/week  Follow Up Recommendations Home health PT  PT equipment Rolling walker with 5" wheels (pt reports her RW is not available at this time)  PT Goal Progression  Progress towards PT goals Progressing toward goals  PT General Charges  $$ ACUTE PT VISIT 1 Procedure  PT Treatments  $Gait Training 8-22 mins   Carmelia Bake, PT, DPT 01/26/2014 Pager: [phone]

## 2014-01-26 NOTE — Discharge Instructions (Addendum)
° °Dr. Frank Aluisio °Total Joint Specialist °[facility] °[address] °[place], [place] [address] °[phone] ° °TOTAL KNEE REPLACEMENT POSTOPERATIVE DIRECTIONS ° ° ° °Knee Rehabilitation, Guidelines Following Surgery  °Results after knee surgery are often greatly improved when you follow the exercise, range of motion and muscle strengthening exercises prescribed by your doctor. Safety measures are also important to protect the knee from further injury. Any time any of these exercises cause you to have increased pain or swelling in your knee joint, decrease the amount until you are comfortable again and slowly increase them. If you have problems or questions, call your caregiver or physical therapist for advice.  ° °HOME CARE INSTRUCTIONS  °Remove items at home which could result in a fall. This includes throw rugs or furniture in walking pathways.  °Continue medications as instructed at time of discharge. °You may have some home medications which will be placed on hold until you complete the course of blood thinner medication.  °You may start showering once you are discharged home but do not submerge the incision under water. Just pat the incision dry and apply a dry gauze dressing on daily. °Walk with walker as instructed.  °You may resume a sexual relationship in one month or when given the OK by  your doctor.  °· Use walker as long as suggested by your caregivers. °· Avoid periods of inactivity such as sitting longer than an hour when not asleep. This helps prevent blood clots.  °You may put full weight on your legs and walk as much as is comfortable.  °You may return to work once you are cleared by your doctor.  °Do not drive a car for 6 weeks or until released by you surgeon.  °· Do not drive while taking narcotics.  °Wear the elastic stockings for three weeks following surgery during the day but you may remove then at night. °Make sure you keep all of your appointments after your  operation with all of your doctors and caregivers. You should call the office at the above phone number and make an appointment for approximately two weeks after the date of your surgery. °Change the dressing daily and reapply a dry dressing each time. °Please pick up a stool softener and laxative for home use as long as you are requiring pain medications. °· Continue to use ice on the knee for pain and swelling from surgery. You may notice swelling that will progress down to the foot and ankle.  This is normal after surgery.  Elevate the leg when you are not up walking on it.   °It is important for you to complete the blood thinner medication as prescribed by your doctor. °· Continue to use the breathing machine which will help keep your temperature down.  It is common for your temperature to cycle up and down following surgery, especially at night when you are not up moving around and exerting yourself.  The breathing machine keeps your lungs expanded and your temperature down. ° °RANGE OF MOTION AND STRENGTHENING EXERCISES  °Rehabilitation of the knee is important following a knee injury or an operation. After just a few days of immobilization, the muscles of the thigh which control the knee become weakened and shrink (atrophy). Knee exercises are designed to build up the tone and strength of the thigh muscles and to improve knee motion. Often times heat used for twenty to thirty minutes before working out will loosen up your tissues and help with improving the   range of motion but do not use heat for the first two weeks following surgery. These exercises can be done on a training (exercise) mat, on the floor, on a table or on a bed. Use what ever works the best and is most comfortable for you Knee exercises include:  Leg Lifts - While your knee is still immobilized in a splint or cast, you can do straight leg raises. Lift the leg to 60 degrees, hold for 3 sec, and slowly lower the leg. Repeat 10-20 times 2-3  times daily. Perform this exercise against resistance later as your knee gets better.  Quad and Hamstring Sets - Tighten up the muscle on the front of the thigh (Quad) and hold for 5-10 sec. Repeat this 10-20 times hourly. Hamstring sets are done by pushing the foot backward against an object and holding for 5-10 sec. Repeat as with quad sets.  A rehabilitation program following serious knee injuries can speed recovery and prevent re-injury in the future due to weakened muscles. Contact your doctor or a physical therapist for more information on knee rehabilitation.   SKILLED REHAB INSTRUCTIONS: If the patient is transferred to a skilled rehab facility following release from the hospital, a list of the current medications will be sent to the facility for the patient to continue.  When discharged from the skilled rehab facility, please have the facility set up the patient's [place] prior to being released. Also, the skilled facility will be responsible for providing the patient with their medications at time of release from the facility to include their pain medication, the muscle relaxants, and their blood thinner medication. If the patient is still at the rehab facility at time of the two week follow up appointment, the skilled rehab facility will also need to assist the patient in arranging follow up appointment in our office and any transportation needs.  MAKE SURE YOU:  Understand these instructions.  Will watch your condition.  Will get help right away if you are not doing well or get worse.    Pick up stool softner and laxative for home. Do not submerge incision under water. May shower. Continue to use ice for pain and swelling from surgery.  Take Xarelto for two and a half more weeks, then discontinue Xarelto. Once the patient has completed the blood thinner regimen, then take a Baby 81 mg Aspirin daily for three more weeks.  Information on my medicine - XARELTO  (Rivaroxaban)  This medication education was reviewed with me or my healthcare representative as part of my discharge preparation.  The pharmacist that spoke with me during my hospital stay was:  Absher, Julieta Bellini, RPH  Why was Xarelto prescribed for you? Xarelto was prescribed for you to reduce the risk of blood clots forming after orthopedic surgery. The medical term for these abnormal blood clots is venous thromboembolism (VTE).  What do you need to know about xarelto ? Take your Xarelto ONCE DAILY at the same time every day. You may take it either with or without food.  If you have difficulty swallowing the tablet whole, you may crush it and mix in applesauce just prior to taking your dose.  Take Xarelto exactly as prescribed by your doctor and DO NOT stop taking Xarelto without talking to the doctor who prescribed the medication.  Stopping without other VTE prevention medication to take the place of Xarelto may increase your risk of developing a clot.  After discharge, you should have regular check-up appointments  with your healthcare provider that is prescribing your Xarelto.    What do you do if you miss a dose? If you miss a dose, take it as soon as you remember on the same day then continue your regularly scheduled once daily regimen the next day. Do not take two doses of Xarelto on the same day.   Important Safety Information A possible side effect of Xarelto is bleeding. You should call your healthcare provider right away if you experience any of the following:   Bleeding from an injury or your nose that does not stop.   Unusual colored urine (red or dark brown) or unusual colored stools (red or black).   Unusual bruising for unknown reasons.   A serious fall or if you hit your head (even if there is no bleeding).  Some medicines may interact with Xarelto and might increase your risk of bleeding while on Xarelto. To help avoid this, consult your healthcare provider  or pharmacist prior to using any new prescription or non-prescription medications, including herbals, vitamins, non-steroidal anti-inflammatory drugs (NSAIDs) and supplements.  This website has more information on Xarelto: [email or web address]

## 2014-01-26 NOTE — Progress Notes (Signed)
   Subjective: 1 Day Post-Op Procedure(s) (LRB): LEFT TOTAL KNEE ARTHROPLASTY WITH RIGHT KNEE INJECTION (Left) Patient reports pain as mild.   Patient seen in rounds with Dr. Wynelle Link. Patient is well, and has had no acute complaints or problems We will start therapy today.  Plan is to go Home after hospital stay.  Objective: Vital signs in last 24 hours: Temp:  [96.6 F (35.9 C)-98.4 F (36.9 C)] 97.5 F (36.4 C) (08/25 0530) Pulse Rate:  [48-74] 74 (08/25 0530) Resp:  [10-20] 20 (08/25 0530) BP: (102-143)/(56-78) 136/78 mmHg (08/25 0530) SpO2:  [97 %-100 %] 97 % (08/25 0530)  Intake/Output from previous day:  Intake/Output Summary (Last 24 hours) at 01/26/14 0906 Last data filed at 01/26/14 0547  Gross per 24 hour  Intake 3406.25 ml  Output   4360 ml  Net -953.75 ml    Intake/Output this shift:    Labs:  Recent Labs  01/26/14 0418  HGB 11.2*    Recent Labs  01/26/14 0418  WBC 9.7  RBC 3.48*  HCT 32.9*  PLT 187    Recent Labs  01/26/14 0418  NA 133*  K 4.3  CL 100  CO2 22  BUN 12  CREATININE 0.92  GLUCOSE 184*  CALCIUM 9.3   No results found for this basename: LABPT, INR,  in the last 72 hours  EXAM General - Patient is Alert, Appropriate and Oriented Extremity - Neurovascular intact Sensation intact distally Dressing - dressing C/D/I Motor Function - intact, moving foot and toes well on exam.  Hemovac pulled without difficulty.  Past Medical History  Diagnosis Date  . Arthritis   . GERD (gastroesophageal reflux disease)   . Chest tightness     ECHO AND STRESS   . Shortness of breath     occasional, usually with activity  . H/O hypercholesterolemia     told one time  . Pneumonia     hx of  . History of shingles     left side of face jan 2015  . DJD (degenerative joint disease)     Assessment/Plan: 1 Day Post-Op Procedure(s) (LRB): LEFT TOTAL KNEE ARTHROPLASTY WITH RIGHT KNEE INJECTION (Left) Principal Problem:   OA  (osteoarthritis) of knee Active Problems:   Postop Hyponatremia  Estimated body mass index is 33.98 kg/(m^2) as calculated from the following:   Height as of this encounter: 5\' 7"  (1.702 m).   Weight as of this encounter: 98.431 kg (217 lb). Advance diet Up with therapy Plan for discharge tomorrow Discharge home with home health  DVT Prophylaxis - Xarelto Weight-Bearing as tolerated to left leg D/C O2 and Pulse OX and try on Room Air  Arlee Muslim, PA-C Orthopaedic Surgery 01/26/2014, 9:06 AM

## 2014-01-26 NOTE — Evaluation (Signed)
Occupational Therapy Evaluation Patient Details Name: Charlotte Burns MRN: [id number] DOB: [birth year] Today's Date: 01/26/2014    History of Present Illness Pt is a 68 year old female s/p L TKA   Clinical Impression   Pt demonstrates decline in function and safety with ADLs and ADL mobility safety with decreased balance and endurance. Pt would benefit from acute OT services to address impairments to increase level of function and safety    Follow Up Recommendations  Home health OT;Supervision/Assistance - 24 hour    Equipment Recommendations  Other (comment) (ADL A/E kit)    Recommendations for Other Services       Precautions / Restrictions Precautions Precautions: Knee, contact Required Braces or Orthoses: Knee Immobilizer - Left Knee Immobilizer - Left: Discontinue once straight leg raise with < 10 degree lag Restrictions Weight Bearing Restrictions: No Other Position/Activity Restrictions: WBAT      Mobility Bed Mobility Overal bed mobility: Needs Assistance Bed Mobility: Sit to Supine     Supine to sit: Min assist     General bed mobility comments: verbal cues for technique, assist for L LE  Transfers Overall transfer level: Needs assistance Equipment used: Rolling walker (2 wheeled) Transfers: Sit to/from Stand Sit to Stand: Min guard Stand pivot transfers: Min guard       General transfer comment: verbal cues for correct hand placement and LE positioning    Balance Overall balance assessment: Needs assistance Sitting-balance support: No upper extremity supported;Feet supported Sitting balance-Leahy Scale: Good     Standing balance support: Single extremity supported;Bilateral upper extremity supported;During functional activity Standing balance-Leahy Scale: Fair                              ADL Overall ADL's : Needs assistance/impaired     Grooming: Wash/dry hands;Wash/dry face;Min guard;Standing   Upper Body Bathing: Set  up;Supervision/ safety;Sitting   Lower Body Bathing: Moderate assistance   Upper Body Dressing : Set up;Supervision/safety;Sitting   Lower Body Dressing: Moderate assistance   Toilet Transfer: Min guard;Minimal assistance;Cueing for safety;RW;Comfort height toilet   Toileting- Clothing Manipulation and Hygiene: Minimal assistance;Sit to/from stand;Cueing for safety   Tub/ Shower Transfer: Minimal assistance;3 in 1;Shower seat;Grab bars   Functional mobility during ADLs: Min guard;Minimal assistance;Cueing for safety       Vision  wears glasses, has cataracts                   Perception Perception Perception Tested?: No   Praxis Praxis Praxis tested?: Not tested    Pertinent Vitals/Pain Pain Assessment: 0-10 Pain Score: 3  Pain Location: L knee Pain Descriptors / Indicators: Aching;Sore Pain Intervention(s): Limited activity within patient's tolerance;Monitored during session;Repositioned;Ice applied     Hand Dominance Right   Extremity/Trunk Assessment Upper Extremity Assessment Upper Extremity Assessment: Overall WFL for tasks assessed   Lower Extremity Assessment Lower Extremity Assessment: Defer to PT evaluation LLE Deficits / Details: fair quad contraction, unable to perform SLR, L knee flexion AAROM 40* limited by pain   Cervical / Trunk Assessment Cervical / Trunk Assessment: Normal   Communication Communication Communication: No difficulties   Cognition Arousal/Alertness: Awake/alert Behavior During Therapy: WFL for tasks assessed/performed Overall Cognitive Status: Within Functional Limits for tasks assessed                     General Comments   pt very pleasant and cooperative  Home Living Family/patient expects to be discharged to:: Private residence Living Arrangements: Spouse/significant other Available Help at Discharge: Family Type of Home: House Home Access: Stairs to enter Technical brewer of  Steps: 4 Entrance Stairs-Rails: Right Home Layout: One level     Bathroom Shower/Tub: Tub/shower unit;Walk-in shower   Bathroom Toilet: Handicapped height     Home Equipment: [place] - single point;Bedside commode;Shower seat - built in          Prior Functioning/Environment Level of Independence: Independent             OT Diagnosis: Acute pain   OT Problem List: Decreased knowledge of use of DME or AE;Impaired sensation;Impaired balance (sitting and/or standing);Decreased activity tolerance   OT Treatment/Interventions: Self-care/ADL training;Therapeutic exercise;Patient/family education;Neuromuscular education;Therapeutic activities;DME and/or AE instruction    OT Goals(Current goals can be found in the care plan section) Acute Rehab OT Goals Patient Stated Goal: get better and go home OT Goal Formulation: With patient Time For Goal Achievement: 02/02/14 Potential to Achieve Goals: Good ADL Goals Pt Will Perform Grooming: with set-up;with supervision;standing Pt Will Perform Lower Body Bathing: with min assist;with caregiver independent in assisting;sit to/from stand;with adaptive equipment Pt Will Perform Lower Body Dressing: with min assist;with caregiver independent in assisting;with adaptive equipment;sit to/from stand Pt Will Transfer to Toilet: with supervision;with modified independence;ambulating;grab bars (3 in 1 over toilet) Pt Will Perform Toileting - Clothing Manipulation and hygiene: with min guard assist;sit to/from stand Pt Will Perform Tub/Shower Transfer: with min guard assist;shower seat;grab bars  OT Frequency: Min 2X/week   Barriers to D/C:    none                     End of Session Equipment Utilized During Treatment: Gait belt;Rolling walker;Other (comment) (3 in 1 over toilet) CPM Left Knee CPM Left Knee: Off  Activity Tolerance: Patient tolerated treatment well Patient left: in bed;with call bell/phone within reach   Time:  1303-1328 OT Time Calculation (min): 25 min Charges:  OT General Charges $OT Visit: 1 Procedure OT Evaluation $Initial OT Evaluation Tier I: 1 Procedure OT Treatments $Therapeutic Activity: 8-22 mins G-Codes:    Britt Bottom 01/26/2014, 1:43 PM

## 2014-01-26 NOTE — Evaluation (Signed)
Physical Therapy Evaluation Patient Details Name: Charlotte Burns MRN: [id number] DOB: February 28, 1946 Today's Date: 01/26/2014   History of Present Illness  Pt is a 68 year old female s/p L TKA  Clinical Impression  Pt is s/p L TKA resulting in the deficits listed below (see PT Problem List).  Pt will benefit from skilled PT to increase their independence and safety with mobility to allow discharge to the venue listed below.  Pt ambulated short distance in hallway and performed exercises.  Pt plans to d/c home with family.     Follow Up Recommendations Home health PT    Equipment Recommendations  None recommended by PT    Recommendations for Other Services       Precautions / Restrictions Precautions Precautions: Knee Required Braces or Orthoses: Knee Immobilizer - Left Knee Immobilizer - Left: Discontinue once straight leg raise with < 10 degree lag Restrictions Other Position/Activity Restrictions: WBAT      Mobility  Bed Mobility Overal bed mobility: Needs Assistance Bed Mobility: Supine to Sit     Supine to sit: Min assist     General bed mobility comments: verbal cues for technique, assist for L LE  Transfers Overall transfer level: Needs assistance Equipment used: Rolling walker (2 wheeled) Transfers: Sit to/from [facility] Sit to Stand: Min guard;From elevated surface Stand pivot transfers: Min guard       General transfer comment: verbal cues for UE and LE positioning  Ambulation/Gait Ambulation/Gait assistance: Min assist Ambulation Distance (Feet): 40 Feet Assistive device: Rolling walker (2 wheeled) Gait Pattern/deviations: Antalgic;Step-to pattern;Trunk flexed     General Gait Details: verbal cues for sequence, RW distance, posture, pt reports some dizziness which did not resolve so limited distance  Science writer    Modified Rankin (Stroke Patients Only)       Balance                                              Pertinent Vitals/Pain Pain Assessment: 0-10 Pain Score: 3  Pain Location: L knee Pain Descriptors / Indicators: Sore;Aching Pain Intervention(s): Limited activity within patient's tolerance;Monitored during session;Repositioned;Premedicated before session;Ice applied    Home Living Family/patient expects to be discharged to:: Private residence Living Arrangements: Spouse/significant other Available Help at Discharge: Family Type of Home: House Home Access: Stairs to enter Entrance Stairs-Rails: Right Entrance Stairs-Number of Steps: 4 Home Layout: One level Home Equipment: Environmental consultant - 2 wheels;Cane - single point;Bedside commode      Prior Function Level of Independence: Independent               Hand Dominance        Extremity/Trunk Assessment               Lower Extremity Assessment: LLE deficits/detail   LLE Deficits / Details: fair quad contraction, unable to perform SLR, L knee flexion AAROM 40* limited by pain     Communication   Communication: No difficulties  Cognition Arousal/Alertness: Awake/alert Behavior During Therapy: WFL for tasks assessed/performed Overall Cognitive Status: Within Functional Limits for tasks assessed                      General Comments      Exercises Total Joint Exercises Ankle Circles/Pumps: AROM;Both;15 reps Quad Sets: AROM;Both;10  reps Gluteal Sets: AROM;Both;10 reps Towel Squeeze: AROM;Both;10 reps Short Arc QuadSinclair Ship;Left;10 reps Heel Slides: AAROM;Left;10 reps Hip ABduction/ADduction: Left;10 reps;AAROM Straight Leg Raises: AAROM;Left;10 reps      Assessment/Plan    PT Assessment Patient needs continued PT services  PT Diagnosis Difficulty walking;Acute pain   PT Problem List Decreased strength;Decreased range of motion;Decreased mobility;Decreased knowledge of precautions;Decreased knowledge of use of DME;Pain  PT Treatment Interventions Functional  mobility training;Stair training;Gait training;DME instruction;Therapeutic activities;Therapeutic exercise;Patient/family education   PT Goals (Current goals can be found in the Care Plan section) Acute Rehab PT Goals PT Goal Formulation: With patient Time For Goal Achievement: 02/02/14 Potential to Achieve Goals: Good    Frequency 7X/week   Barriers to discharge        Co-evaluation               End of Session Equipment Utilized During Treatment: Gait belt;Left knee immobilizer Activity Tolerance: Patient limited by fatigue;Patient limited by pain Patient left: in chair;with call bell/phone within reach           Time: 1027-1057 PT Time Calculation (min): 30 min   Charges:   PT Evaluation $Initial PT Evaluation Tier I: 1 Procedure PT Treatments $Gait Training: 8-22 mins $Therapeutic Exercise: 8-22 mins   PT G Codes:          Willer Osorno,KATHrine E 01/26/2014, 12:54 PM Carmelia Bake, PT, DPT 01/26/2014 Pager: [phone]

## 2014-01-27 LAB — BASIC METABOLIC PANEL
Anion gap: 12 (ref 5–15)
BUN: 15 mg/dL (ref 6–23)
CO2: 23 mEq/L (ref 19–32)
Calcium: 9.8 mg/dL (ref 8.4–10.5)
Chloride: 103 mEq/L (ref 96–112)
Creatinine, Ser: 0.77 mg/dL (ref 0.50–1.10)
GFR calc Af Amer: >90 mL/min (ref >90)
GFR calc non Af Amer: 83 mL/min — ABNORMAL LOW (ref >90)
Glucose, Bld: 199 mg/dL — ABNORMAL HIGH (ref 70–99)
Potassium: 4.6 mEq/L (ref 3.7–5.3)
Sodium: 138 mEq/L (ref 137–147)

## 2014-01-27 LAB — CBC
HCT: 30.8 % — ABNORMAL LOW (ref 36.0–46.0)
Hemoglobin: 10.6 g/dL — ABNORMAL LOW (ref 12.0–15.0)
MCH: 32.3 pg (ref 26.0–34.0)
MCHC: 34.4 g/dL (ref 30.0–36.0)
MCV: 93.9 fL (ref 78.0–100.0)
Platelets: 197 10*3/uL (ref 150–400)
RBC: 3.28 MIL/uL — ABNORMAL LOW (ref 3.87–5.11)
RDW: 11.9 % (ref 11.5–15.5)
WBC: 10.2 10*3/uL (ref 4.0–10.5)

## 2014-01-27 MED ORDER — OXYCODONE HCL 5 MG PO TABS: 5.0000 mg | ORAL_TABLET | ORAL | Status: DC | PRN

## 2014-01-27 MED ORDER — RIVAROXABAN 10 MG PO TABS: 10.0000 mg | ORAL_TABLET | Freq: Every day | ORAL | Status: DC

## 2014-01-27 MED ORDER — TRAMADOL HCL 50 MG PO TABS: 50.0000 mg | ORAL_TABLET | Freq: Four times a day (QID) | ORAL | Status: DC | PRN

## 2014-01-27 MED ORDER — METHOCARBAMOL 500 MG PO TABS: 500.0000 mg | ORAL_TABLET | Freq: Four times a day (QID) | ORAL | Status: DC | PRN

## 2014-01-27 NOTE — Progress Notes (Signed)
Physical Therapy Treatment Patient Details Name: Charlotte Burns MRN: [id number] DOB: 09-08-1945 Today's Date: 01/27/2014    History of Present Illness Pt is a 68 year old female s/p L TKA    PT Comments    Pt is mobilizing well and practiced steps.  Pt provided with stair technique handout.  Pt also performed exercises.  Pt feels ready for d/c home and had no further questions/concerns.   Follow Up Recommendations  Home health PT     Equipment Recommendations  Rolling walker with 5" wheels    Recommendations for Other Services       Precautions / Restrictions Precautions Precautions: Knee Required Braces or Orthoses: Knee Immobilizer - Left Knee Immobilizer - Left: Discontinue once straight leg raise with < 10 degree lag Restrictions Weight Bearing Restrictions: No Other Position/Activity Restrictions: WBAT    Mobility  Bed Mobility Overal bed mobility: Needs Assistance Bed Mobility: Supine to Sit;Sit to Supine     Supine to sit: Supervision Sit to supine: Supervision   General bed mobility comments: self assist for L LE  Transfers Overall transfer level: Needs assistance Equipment used: Rolling walker (2 wheeled) Transfers: Sit to/from Stand Sit to Stand: Supervision Stand pivot transfers: Supervision       General transfer comment: verbal cues for UE and LE positioning  Ambulation/Gait Ambulation/Gait assistance: Min guard Ambulation Distance (Feet): 60 Feet Assistive device: Rolling walker (2 wheeled) Gait Pattern/deviations: Antalgic;Step-to pattern     General Gait Details: verbal cues for RW distance and heel strike, pt reports not using KI around room so performed mobility today without KI   Stairs Stairs: Yes Stairs assistance: Min guard Stair Management: Step to pattern;Backwards;With walker Number of Stairs: 2 General stair comments: verbal cues for sequence, RW positioning and safety, provided handout   Wheelchair Mobility     Modified Rankin (Stroke Patients Only)       Balance                                    Cognition Arousal/Alertness: Awake/alert Behavior During Therapy: WFL for tasks assessed/performed Overall Cognitive Status: Within Functional Limits for tasks assessed                      Exercises Total Joint Exercises Ankle Circles/Pumps: AROM;Both;15 reps Quad Sets: AROM;Both;15 reps Towel Squeeze: AROM;Both;15 reps Short Arc QuadSinclair Ship;Left;15 reps Heel Slides: AAROM;Left;15 reps;Seated Hip ABduction/ADduction: AAROM;Left;15 reps Straight Leg Raises: Left;AAROM;15 reps    General Comments        Pertinent Vitals/Pain Pain Assessment: 0-10 Pain Score: 2  Pain Location: L knee Pain Descriptors / Indicators: Aching;Sore Pain Intervention(s): Limited activity within patient's tolerance;Monitored during session;Premedicated before session;Repositioned;Ice applied    Home Living                      Prior Function            PT Goals (current goals can now be found in the care plan section) Progress towards PT goals: Progressing toward goals    Frequency  7X/week    PT Plan Current plan remains appropriate    Co-evaluation             End of Session   Activity Tolerance: Patient tolerated treatment well Patient left: in bed;with call bell/phone within reach     Time: 0929-0953 PT Time Calculation (min): 24 min  Charges:  $Gait Training: 8-22 mins $Therapeutic Exercise: 8-22 mins                    G Codes:      Nakima Fluegge,KATHrine E 05-Feb-2014, 11:51 AM Carmelia Bake, PT, DPT 02/05/2014 Pager: [phone]

## 2014-01-27 NOTE — Discharge Summary (Signed)
Physician Discharge Summary   Patient ID: Charlotte Burns MRN: [id number] DOB/AGE: April 25, 1946 68 y.o.  Admit date: 01/25/2014 Discharge date: 01/27/2014  Primary Diagnosis:  Osteoarthritis Left knee(s)  Admission Diagnoses:  Past Medical History  Diagnosis Date  . Arthritis   . GERD (gastroesophageal reflux disease)   . Chest tightness     ECHO AND STRESS   . Shortness of breath     occasional, usually with activity  . H/O hypercholesterolemia     told one time  . Pneumonia     hx of  . History of shingles     left side of face jan 2015  . DJD (degenerative joint disease)    Discharge Diagnoses:   Principal Problem:   OA (osteoarthritis) of knee Active Problems:   Postop Hyponatremia  Estimated body mass index is 33.98 kg/(m^2) as calculated from the following:   Height as of this encounter: _0  (1.702 m).   Weight as of this encounter: 98.431 kg (217 lb).  Procedure:  Procedure(s) (LRB): LEFT TOTAL KNEE ARTHROPLASTY WITH RIGHT KNEE INJECTION (Left)   Consults: None  HPI: Charlotte Burns is a 68 y.o. year old female with end stage OA of her left knee with progressively worsening pain and dysfunction. She has constant pain, with activity and at rest and significant functional deficits with difficulties even with ADLs. She has had extensive non-op management including analgesics, injections of cortisone and viscosupplements, and home exercise program, but remains in significant pain with significant dysfunction. Radiographs show bone on bone arthritis medial and patellofemoral. She presents now for left Total Knee Arthroplasty.   Laboratory Data: Admission on 01/25/2014, Discharged on 01/27/2014  Component Date Value Ref Range Status  . ABO/RH(D) 01/25/2014 O POS   Final  . Antibody Screen 01/25/2014 NEG   Final  . Sample Expiration 01/25/2014 01/28/2014   Final  . WBC 01/26/2014 9.7  4.0 - 10.5 K/uL Final  . RBC 01/26/2014 3.48* 3.87 - 5.11 MIL/uL Final  .  Hemoglobin 01/26/2014 11.2* 12.0 - 15.0 g/dL Final  . HCT 01/26/2014 32.9* 36.0 - 46.0 % Final  . MCV 01/26/2014 94.5  78.0 - 100.0 fL Final  . MCH 01/26/2014 32.2  26.0 - 34.0 pg Final  . MCHC 01/26/2014 34.0  30.0 - 36.0 g/dL Final  . RDW 01/26/2014 11.8  11.5 - 15.5 % Final  . Platelets 01/26/2014 187  150 - 400 K/uL Final  . Sodium 01/26/2014 133* 137 - 147 mEq/L Final  . Potassium 01/26/2014 4.3  3.7 - 5.3 mEq/L Final  . Chloride 01/26/2014 100  96 - 112 mEq/L Final  . CO2 01/26/2014 22  19 - 32 mEq/L Final  . Glucose, Bld 01/26/2014 184* 70 - 99 mg/dL Final  . BUN 01/26/2014 12  6 - 23 mg/dL Final  . Creatinine, Ser 01/26/2014 0.92  0.50 - 1.10 mg/dL Final  . Calcium 01/26/2014 9.3  8.4 - 10.5 mg/dL Final  . GFR calc non Af Amer 01/26/2014 62* >90 mL/min Final  . GFR calc Af Amer 01/26/2014 71* >90 mL/min Final   Comment: (NOTE)                          The eGFR has been calculated using the CKD EPI equation.                          This calculation has not been validated in  all clinical situations.                          eGFR's persistently <90 mL/min signify possible Chronic Kidney                          Disease.  . Anion gap 01/26/2014 11  5 - 15 Final  . WBC 01/27/2014 10.2  4.0 - 10.5 K/uL Final  . RBC 01/27/2014 3.28* 3.87 - 5.11 MIL/uL Final  . Hemoglobin 01/27/2014 10.6* 12.0 - 15.0 g/dL Final  . HCT 01/27/2014 30.8* 36.0 - 46.0 % Final  . MCV 01/27/2014 93.9  78.0 - 100.0 fL Final  . MCH 01/27/2014 32.3  26.0 - 34.0 pg Final  . MCHC 01/27/2014 34.4  30.0 - 36.0 g/dL Final  . RDW 01/27/2014 11.9  11.5 - 15.5 % Final  . Platelets 01/27/2014 197  150 - 400 K/uL Final  . Sodium 01/27/2014 138  137 - 147 mEq/L Final  . Potassium 01/27/2014 4.6  3.7 - 5.3 mEq/L Final  . Chloride 01/27/2014 103  96 - 112 mEq/L Final  . CO2 01/27/2014 23  19 - 32 mEq/L Final  . Glucose, Bld 01/27/2014 199* 70 - 99 mg/dL Final  . BUN 01/27/2014 15  6 - 23 mg/dL Final  . Creatinine,  Ser 01/27/2014 0.77  0.50 - 1.10 mg/dL Final  . Calcium 01/27/2014 9.8  8.4 - 10.5 mg/dL Final  . GFR calc non Af Amer 01/27/2014 83* >90 mL/min Final  . GFR calc Af Amer 01/27/2014 >90  >90 mL/min Final   Comment: (NOTE)                          The eGFR has been calculated using the CKD EPI equation.                          This calculation has not been validated in all clinical situations.                          eGFR's persistently <90 mL/min signify possible Chronic Kidney                          Disease.  Georgiann Hahn gap 01/27/2014 12  5 - 15 Final  Hospital Outpatient Visit on 01/19/2014  Component Date Value Ref Range Status  . MRSA, PCR 01/19/2014 POSITIVE* NEGATIVE Final   Comment: RESULT CALLED TO, READ BACK BY AND VERIFIED WITH:                          STANLEY,K. _0  ON 8.18.15 BY NM   . Staphylococcus aureus 01/19/2014 POSITIVE* NEGATIVE Final   Comment:                                 The Xpert SA Assay (FDA                          approved for NASAL specimens                          in patients over 21 years of  age),                          is one component of                          a comprehensive surveillance                          program.  Test performance has                          been validated by [facility] for patients greater                          than or equal to 69 year old.                          It is not intended                          to diagnose infection nor to                          guide or monitor treatment.                          RESULT CALLED TO, READ BACK BY AND VERIFIED WITH:                          STANLEY,K. _0  ON 8.18.15 BY NM   . aPTT 01/19/2014 27  24 - 37 seconds Final  . WBC 01/19/2014 5.3  4.0 - 10.5 K/uL Final  . RBC 01/19/2014 4.26  3.87 - 5.11 MIL/uL Final  . Hemoglobin 01/19/2014 13.5  12.0 - 15.0 g/dL Final  . HCT 01/19/2014 40.8  36.0 - 46.0 % Final  . MCV 01/19/2014 95.8   78.0 - 100.0 fL Final  . MCH 01/19/2014 31.7  26.0 - 34.0 pg Final  . MCHC 01/19/2014 33.1  30.0 - 36.0 g/dL Final  . RDW 01/19/2014 12.1  11.5 - 15.5 % Final  . Platelets 01/19/2014 204  150 - 400 K/uL Final  . Sodium 01/19/2014 140  137 - 147 mEq/L Final  . Potassium 01/19/2014 4.3  3.7 - 5.3 mEq/L Final  . Chloride 01/19/2014 105  96 - 112 mEq/L Final  . CO2 01/19/2014 23  19 - 32 mEq/L Final  . Glucose, Bld 01/19/2014 101* 70 - 99 mg/dL Final  . BUN 01/19/2014 17  6 - 23 mg/dL Final  . Creatinine, Ser 01/19/2014 0.99  0.50 - 1.10 mg/dL Final  . Calcium 01/19/2014 10.0  8.4 - 10.5 mg/dL Final  . Total Protein 01/19/2014 7.1  6.0 - 8.3 g/dL Final  . Albumin 01/19/2014 3.9  3.5 - 5.2 g/dL Final  . AST 01/19/2014 27  0 - 37 U/L Final  . ALT 01/19/2014 38* 0 - 35 U/L Final  . Alkaline Phosphatase 01/19/2014 115  39 - 117 U/L Final  . Total Bilirubin 01/19/2014 0.4  0.3 - 1.2 mg/dL Final  .  GFR calc non Af Amer 01/19/2014 56* >90 mL/min Final  . GFR calc Af Amer 01/19/2014 65* >90 mL/min Final   Comment: (NOTE)                          The eGFR has been calculated using the CKD EPI equation.                          This calculation has not been validated in all clinical situations.                          eGFR's persistently <90 mL/min signify possible Chronic Kidney                          Disease.  . Anion gap 01/19/2014 12  5 - 15 Final  . Prothrombin Time 01/19/2014 13.0  11.6 - 15.2 seconds Final  . INR 01/19/2014 0.98  0.00 - 1.49 Final  . Color, Urine 01/19/2014 YELLOW  YELLOW Final  . APPearance 01/19/2014 CLOUDY* CLEAR Final  . Specific Gravity, Urine 01/19/2014 1.015  1.005 - 1.030 Final  . pH 01/19/2014 5.5  5.0 - 8.0 Final  . Glucose, UA 01/19/2014 NEGATIVE  NEGATIVE mg/dL Final  . Hgb urine dipstick 01/19/2014 TRACE* NEGATIVE Final  . Bilirubin Urine 01/19/2014 NEGATIVE  NEGATIVE Final  . Ketones, ur 01/19/2014 NEGATIVE  NEGATIVE mg/dL Final  . Protein, ur  01/19/2014 NEGATIVE  NEGATIVE mg/dL Final  . Urobilinogen, UA 01/19/2014 0.2  0.0 - 1.0 mg/dL Final  . Nitrite 01/19/2014 NEGATIVE  NEGATIVE Final  . Leukocytes, UA 01/19/2014 LARGE* NEGATIVE Final  . Squamous Epithelial / LPF 01/19/2014 RARE  RARE Final  . WBC, UA 01/19/2014 21-50  <3 WBC/hpf Final  . RBC / HPF 01/19/2014 0-2  <3 RBC/hpf Final  . Bacteria, UA 01/19/2014 RARE  RARE Final     X-Rays:Dg Chest 2 View  01/19/2014   CLINICAL DATA:  Preoperative respiratory evaluation prior to left total knee arthroplasty.  EXAM: CHEST  2 VIEW  COMPARISON:  None.  FINDINGS: Cardiac silhouette normal in size. Thoracic aorta mildly atherosclerotic. Hilar and mediastinal contours otherwise unremarkable. Linear scar or atelectasis in the left lower lobe. Lungs otherwise clear. No localized airspace consolidation. No pleural effusions. No pneumothorax. Normal pulmonary vascularity. Mild degenerative changes involving the thoracic spine.  IMPRESSION: Linear scar or atelectasis in the left lower lobe. No acute cardiopulmonary disease otherwise.   Electronically Signed   By: Evangeline Dakin M.D.   On: 01/19/2014 13:08    EKG:No orders found for this or any previous visit.   Hospital Course: Charlotte Burns is a 68 y.o. who was admitted to [facility]. They were brought to the operating room on 01/25/2014 and underwent Procedure(s): LEFT TOTAL KNEE ARTHROPLASTY WITH RIGHT KNEE INJECTION.  Patient tolerated the procedure well and was later transferred to the recovery room and then to the orthopaedic floor for postoperative care.  They were given PO and IV analgesics for pain control following their surgery.  They were given 24 hours of postoperative antibiotics of  Anti-infectives   Start     Dose/Rate Route Frequency Ordered Stop   01/25/14 1630  ceFAZolin (ANCEF) IVPB 2 g/50 mL premix     2 g 100 mL/hr over 30 Minutes Intravenous Every 6 hours 01/25/14 1419 01/25/14 2156  01/25/14 1000   vancomycin (VANCOCIN) IVPB 1000 mg/200 mL premix     1,000 mg 200 mL/hr over 60 Minutes Intravenous  Once 01/25/14 0949 01/25/14 1049   01/25/14 0727  ceFAZolin (ANCEF) IVPB 2 g/50 mL premix     2 g 100 mL/hr over 30 Minutes Intravenous On call to O.R. 01/25/14 8101 01/25/14 1059     and started on DVT prophylaxis in the form of Xarelto.   PT and OT were ordered for total joint protocol.  Discharge planning consulted to help with postop disposition and equipment needs.  Patient had a decent night on the evening of surgery.  They started to get up OOB with therapy on day one. Hemovac drain was pulled without difficulty.  Continued to work with therapy into day two.  Dressing was changed on day two and the incision was healing well.   Patient was seen in rounds by Dr. Wynelle Link and was ready to go home.   Diet: Cardiac diet Activity:WBAT Follow-up:in 2 weeks Disposition - Home Discharged Condition: good       Discharge Instructions   Call MD / Call 911    Complete by:  As directed   If you experience chest pain or shortness of breath, CALL 911 and be transported to the hospital emergency room.  If you develope a fever above 101 F, pus (white drainage) or increased drainage or redness at the wound, or calf pain, call your surgeon's office.     Change dressing    Complete by:  As directed   Change dressing daily with sterile 4 x 4 inch gauze dressing and apply TED hose. Do not submerge the incision under water.     Constipation Prevention    Complete by:  As directed   Drink plenty of fluids.  Prune juice may be helpful.  You may use a stool softener, such as Colace (over the counter) 100 mg twice a day.  Use MiraLax (over the counter) for constipation as needed.     Diet - low sodium heart healthy    Complete by:  As directed      Discharge instructions    Complete by:  As directed   Pick up stool softner and laxative for home. Do not submerge incision under water. May shower. Continue  to use ice for pain and swelling from surgery.  Take Xarelto for two and a half more weeks, then discontinue Xarelto. Once the patient has completed the blood thinner regimen, then take a Baby 81 mg Aspirin daily for three more weeks.     Do not put a pillow under the knee. Place it under the heel.    Complete by:  As directed      Do not sit on low chairs, stoools or toilet seats, as it may be difficult to get up from low surfaces    Complete by:  As directed      Driving restrictions    Complete by:  As directed   No driving until released by the physician.     Increase activity slowly as tolerated    Complete by:  As directed      Lifting restrictions    Complete by:  As directed   No lifting until released by the physician.     Patient may shower    Complete by:  As directed   You may shower without a dressing once there is no drainage.  Do not wash over the wound.  If  drainage remains, do not shower until drainage stops.     TED hose    Complete by:  As directed   Use stockings (TED hose) for 3 weeks on both leg(s).  You may remove them at night for sleeping.     Weight bearing as tolerated    Complete by:  As directed             Medication List    STOP taking these medications       diclofenac 75 MG EC tablet  Commonly known as:  VOLTAREN     OVER THE COUNTER MEDICATION     OVER THE COUNTER MEDICATION      TAKE these medications       gabapentin 300 MG capsule  Commonly known as:  NEURONTIN  Take 600 mg by mouth at bedtime.     methocarbamol 500 MG tablet  Commonly known as:  ROBAXIN  Take 1 tablet (500 mg total) by mouth every 6 (six) hours as needed for muscle spasms.     oxyCODONE 5 MG immediate release tablet  Commonly known as:  Oxy IR/ROXICODONE  Take 1-2 tablets (5-10 mg total) by mouth every 3 (three) hours as needed for moderate pain, severe pain or breakthrough pain.     polyvinyl alcohol 1.4 % ophthalmic solution  Commonly known as:  LIQUIFILM  TEARS  Place 1 drop into both eyes daily as needed for dry eyes.     ranitidine 150 MG tablet  Commonly known as:  ZANTAC  Take 150 mg by mouth every morning.     rivaroxaban 10 MG Tabs tablet  Commonly known as:  XARELTO  - Take 1 tablet (10 mg total) by mouth daily with breakfast. Take Xarelto for two and a half more weeks, then discontinue Xarelto.  - Once the patient has completed the blood thinner regimen, then take a Baby 81 mg Aspirin daily for three more weeks.     traMADol 50 MG tablet  Commonly known as:  ULTRAM  Take 1-2 tablets (50-100 mg total) by mouth every 6 (six) hours as needed (mild to moderate pain).       Follow-up Information   Follow up with Gearlean Alf, MD. Schedule an appointment as soon as possible for a visit in 2 weeks.   Specialty:  Orthopedic Surgery   Contact information:   [address] [place] [address] [phone]       Follow up with Herminie.   Contact information:   [address] [place] [address] [phone]       Signed: Arlee Muslim, PA-C Orthopaedic Surgery 02/04/2014, 9:10 AM

## 2014-01-27 NOTE — Progress Notes (Signed)
   Subjective: 2 Days Post-Op Procedure(s) (LRB): LEFT TOTAL KNEE ARTHROPLASTY WITH RIGHT KNEE INJECTION (Left) Patient reports pain as mild.   Plan is to go Home after hospital stay.  Objective: Vital signs in last 24 hours: Temp:  [97.9 F (36.6 C)-98.6 F (37 C)] 98.3 F (36.8 C) (08/26 0500) Pulse Rate:  [68-78] 72 (08/26 0500) Resp:  [16-18] 16 (08/26 0500) BP: (114-138)/(60-101) 138/63 mmHg (08/26 0500) SpO2:  [94 %-100 %] 94 % (08/26 0500)  Intake/Output from previous day:  Intake/Output Summary (Last 24 hours) at 01/27/14 0721 Last data filed at 01/27/14 0450  Gross per 24 hour  Intake 1249.17 ml  Output   5650 ml  Net -4400.83 ml    Intake/Output this shift:    Labs:  Recent Labs  01/26/14 0418 01/27/14 0423  HGB 11.2* 10.6*    Recent Labs  01/26/14 0418 01/27/14 0423  WBC 9.7 10.2  RBC 3.48* 3.28*  HCT 32.9* 30.8*  PLT 187 197    Recent Labs  01/26/14 0418 01/27/14 0423  NA 133* 138  K 4.3 4.6  CL 100 103  CO2 22 23  BUN 12 15  CREATININE 0.92 0.77  GLUCOSE 184* 199*  CALCIUM 9.3 9.8   No results found for this basename: LABPT, INR,  in the last 72 hours  EXAM General - Patient is Alert, Appropriate and Oriented Extremity - Neurologically intact Neurovascular intact Incision: dressing C/D/I No cellulitis present Compartment soft Dressing/Incision - clean, dry, no drainage Motor Function - intact, moving foot and toes well on exam.   Past Medical History  Diagnosis Date  . Arthritis   . GERD (gastroesophageal reflux disease)   . Chest tightness     ECHO AND STRESS   . Shortness of breath     occasional, usually with activity  . H/O hypercholesterolemia     told one time  . Pneumonia     hx of  . History of shingles     left side of face jan 2015  . DJD (degenerative joint disease)     Assessment/Plan: 2 Days Post-Op Procedure(s) (LRB): LEFT TOTAL KNEE ARTHROPLASTY WITH RIGHT KNEE INJECTION (Left) Principal  Problem:   OA (osteoarthritis) of knee Active Problems:   Postop Hyponatremia   Up with therapy Discharge home with home health  DVT Prophylaxis - Xarelto Weight-Bearing as tolerated to left leg  Charlotte Burns V 01/27/2014, 7:21 AM

## 2014-01-27 NOTE — Progress Notes (Signed)
Request for rolling walker faxed to [facility] with note that patient is dcd and awaiting delivery at 1010.

## 2014-01-27 NOTE — Progress Notes (Signed)
Occupational Therapy Treatment Patient Details Name: JANELIE GOLTZ MRN: [id number] DOB: September 07, 1945 Today's Date: 01/27/2014    History of present illness Pt is a 68 year old female s/p L TKA      Follow Up Recommendations  Supervision/Assistance - 24 hour          Precautions / Restrictions Precautions Precautions: Knee Required Braces or Orthoses: Knee Immobilizer - Left Knee Immobilizer - Left: Discontinue once straight leg raise with < 10 degree lag Restrictions Weight Bearing Restrictions: No Other Position/Activity Restrictions: WBAT       Mobility Bed Mobility Overal bed mobility: Needs Assistance Bed Mobility: Supine to Sit;Sit to Supine     Supine to sit: Supervision Sit to supine: Supervision   General bed mobility comments: self assist for L LE  Transfers Overall transfer level: Needs assistance Equipment used: Rolling walker (2 wheeled) Transfers: Sit to/from Stand Sit to Stand: Supervision Stand pivot transfers: Supervision       General transfer comment: verbal cues for UE and LE positioning    Balance                                   ADL Overall ADL's : Needs assistance/impaired                         Toilet Transfer: Supervision/safety;Ambulation;RW   Toileting- Clothing Manipulation and Hygiene: Supervision/safety;Sit to/from stand   Tub/ Shower Transfer: 3 in 1;Shower seat;Grab bars;Supervision/safety   Functional mobility during ADLs: Supervision/safety                  Cognition   Behavior During Therapy: WFL for tasks assessed/performed Overall Cognitive Status: Within Functional Limits for tasks assessed                         Exercises Total Joint Exercises Ankle Circles/Pumps: AROM;Both;15 reps Quad Sets: AROM;Both;15 reps Towel Squeeze: AROM;Both;15 reps Short Arc QuadSinclair Ship;Left;15 reps Heel Slides: AAROM;Left;15 reps;Seated Hip ABduction/ADduction: AAROM;Left;15  reps Straight Leg Raises: Left;AAROM;15 reps           Pertinent Vitals/ Pain       Pain Assessment: 0-10 Pain Score: 2  Pain Location: L knee Pain Descriptors / Indicators: Aching;Sore Pain Intervention(s): Repositioned;Ice applied         Frequency Min 2X/week     Progress Toward Goals  OT Goals(current goals can now be found in the care plan section)  Progress towards OT goals: Progressing toward goals            End of Session Equipment Utilized During Treatment: Gait belt;Rolling walker;Other (comment) CPM Left Knee CPM Left Knee: Off   Activity Tolerance Patient tolerated treatment well   Patient Left in bed;with call bell/phone within reach           Time: 1210-1220 OT Time Calculation (min): 10 min  Charges: OT General Charges $OT Visit: 1 Procedure OT Treatments $Self Care/Home Management : 8-22 mins  Kadance Mccuistion, Thereasa Parkin 01/27/2014, 12:28 PM

## 2014-02-15 ENCOUNTER — Encounter: Payer: Self-pay | Admitting: Orthopedic Surgery

## 2014-03-04 ENCOUNTER — Encounter: Payer: Self-pay | Admitting: Orthopedic Surgery

## 2014-04-04 ENCOUNTER — Encounter: Payer: Self-pay | Admitting: Orthopedic Surgery

## 2015-08-02 DIAGNOSIS — J40 Bronchitis, not specified as acute or chronic: Secondary | ICD-10-CM | POA: Diagnosis not present

## 2015-08-22 DIAGNOSIS — R42 Dizziness and giddiness: Secondary | ICD-10-CM | POA: Diagnosis not present

## 2015-08-22 DIAGNOSIS — R110 Nausea: Secondary | ICD-10-CM | POA: Diagnosis not present

## 2015-08-22 DIAGNOSIS — E785 Hyperlipidemia, unspecified: Secondary | ICD-10-CM | POA: Diagnosis not present

## 2015-09-15 DIAGNOSIS — Z471 Aftercare following joint replacement surgery: Secondary | ICD-10-CM | POA: Diagnosis not present

## 2015-09-15 DIAGNOSIS — M1711 Unilateral primary osteoarthritis, right knee: Secondary | ICD-10-CM | POA: Diagnosis not present

## 2015-09-15 DIAGNOSIS — Z96641 Presence of right artificial hip joint: Secondary | ICD-10-CM | POA: Diagnosis not present

## 2015-10-06 DIAGNOSIS — B0233 Zoster keratitis: Secondary | ICD-10-CM | POA: Diagnosis not present

## 2015-11-16 DIAGNOSIS — R42 Dizziness and giddiness: Secondary | ICD-10-CM | POA: Diagnosis not present

## 2015-11-16 DIAGNOSIS — E785 Hyperlipidemia, unspecified: Secondary | ICD-10-CM | POA: Diagnosis not present

## 2015-11-17 DIAGNOSIS — R7301 Impaired fasting glucose: Secondary | ICD-10-CM | POA: Diagnosis not present

## 2015-11-17 DIAGNOSIS — N390 Urinary tract infection, site not specified: Secondary | ICD-10-CM | POA: Diagnosis not present

## 2015-11-23 DIAGNOSIS — E119 Type 2 diabetes mellitus without complications: Secondary | ICD-10-CM | POA: Diagnosis not present

## 2015-11-23 DIAGNOSIS — Z0000 Encounter for general adult medical examination without abnormal findings: Secondary | ICD-10-CM | POA: Diagnosis not present

## 2015-11-23 DIAGNOSIS — E785 Hyperlipidemia, unspecified: Secondary | ICD-10-CM | POA: Diagnosis not present

## 2015-11-24 ENCOUNTER — Other Ambulatory Visit: Payer: Self-pay | Admitting: Family Medicine

## 2015-11-24 DIAGNOSIS — Z1231 Encounter for screening mammogram for malignant neoplasm of breast: Secondary | ICD-10-CM

## 2015-12-09 ENCOUNTER — Other Ambulatory Visit: Payer: Self-pay | Admitting: Family Medicine

## 2015-12-09 ENCOUNTER — Ambulatory Visit
Admission: RE | Admit: 2015-12-09 | Discharge: 2015-12-09 | Disposition: A | Discharge status: Home / Self Care | Payer: PPO | Source: Ambulatory Visit | Attending: Family Medicine | Admitting: Family Medicine

## 2015-12-09 DIAGNOSIS — Z1231 Encounter for screening mammogram for malignant neoplasm of breast: Secondary | ICD-10-CM

## 2015-12-20 ENCOUNTER — Encounter: Payer: PPO | Attending: Family Medicine | Admitting: Dietician

## 2015-12-20 VITALS — BP 120/76 | Ht 67.0 in | Wt 220.9 lb

## 2015-12-20 DIAGNOSIS — E119 Type 2 diabetes mellitus without complications: Secondary | ICD-10-CM | POA: Diagnosis not present

## 2015-12-20 NOTE — Progress Notes (Signed)
Diabetes Self-Management Education  Visit Type: First/Initial  Appt. Start Time: 1330 Appt. End Time: 1430  12/20/2015  Charlotte Burns, identified by name and date of birth, is a 70 y.o. female with a diagnosis of Diabetes: Type 2.   ASSESSMENT  Blood pressure 120/76, height 5\' 7"  (1.702 m), weight 220 lb 14.4 oz (100.2 kg). Body mass index is 34.59 kg/(m^2). C/o some back pain and occasional tingling in left hand     Diabetes Self-Management Education - 12/20/15 1446    Visit Information   Visit Type First/Initial   Initial Visit   Diabetes Type Type 2   Health Coping   How would you rate your overall health? Fair   Psychosocial Assessment   Patient Belief/Attitude about Diabetes Motivated to manage diabetes   Self-care barriers None   Patient Concerns Healthy Lifestyle;Weight Control;Glycemic Control  prevent complications   Special Needs None   Preferred Learning Style Hands on   Learning Readiness Ready   What is the last grade level you completed in school? 12   Pre-Education Assessment   Patient understands the diabetes disease and treatment process. Needs Instruction   Patient understands incorporating nutritional management into lifestyle. Needs Instruction   Patient undertands incorporating physical activity into lifestyle. Needs Instruction   Patient understands using medications safely. Needs Instruction   Patient understands monitoring blood glucose, interpreting and using results Needs Instruction   Patient understands prevention, detection, and treatment of acute complications. Needs Instruction   Patient understands prevention, detection, and treatment of chronic complications. Needs Instruction   Patient understands how to develop strategies to address psychosocial issues. Needs Instruction   Patient understands how to develop strategies to promote health/change behavior. Needs Instruction   Complications   Last HgB A1C per patient/outside source 7.3  %  11-17-15   How often do you check your blood sugar? 0 times/day (not testing)   Have you had a dilated eye exam in the past 12 months? Yes  10-06-15   Have you had a dental exam in the past 12 months? No  about 10 yrs ago   Are you checking your feet? No   Dietary Intake   Breakfast --  eats breakfast 7:30-8:30a   Snack (morning) --  none   Lunch --  eats lunch 12-1p=eats fried foods 8+x/day; eats snack foods and sweets daily   Snack (afternoon) --  eats cookie or fruit   Dinner --  eats supper 6-8p   Snack (evening) --  none   Beverage(s) --  drinks water 2-3x/day and diet/sugar free  drinks 2-3x/day; drinks occasional milk   Exercise   Exercise Type Light (walking / raking leaves);ADL's   Patient Education   Previous Diabetes Education Yes (please comment)  with husband at [facility] few years ago   Disease state  Definition of diabetes, type 1 and 2, and the diagnosis of diabetes   Nutrition management  Food label reading, portion sizes and measuring food.;Carbohydrate counting;Role of diet in the treatment of diabetes and the relationship between the three main macronutrients and blood glucose level   Physical activity and exercise  Role of exercise on diabetes management, blood pressure control and cardiac health.;Helped patient identify appropriate exercises in relation to his/her diabetes, diabetes complications and other health issue.   Monitoring Taught/evaluated SMBG meter.;Purpose and frequency of SMBG.;Taught/discussed recording of test results and interpretation of SMBG.;Identified appropriate SMBG and/or A1C goals.  gave pt One Touch Verio Flex meter and instructed on its use-BG 155 (2  hr pp)   Chronic complications Relationship between chronic complications and blood glucose control;Dental care;Retinopathy and reason for yearly dilated eye exams   Personal strategies to promote health Lifestyle issues that need to be addressed for better diabetes care;Helped patient develop  diabetes management plan for (enter comment)   Outcomes   Expected Outcomes Demonstrated interest in learning. Expect positive outcomes      Individualized Plan for Diabetes Self-Management Training:   Learning Objective:  Patient will have a greater understanding of diabetes self-management. Patient education plan is to attend individual and/or group sessions per assessed needs and concerns.   Plan:   Patient Instructions   Check blood sugars 2 x day before breakfast and 2 hrs after supper every day Exercise:  walk for   10-15  minutes   3  days a week Avoid sugar sweetened drinks (soda, tea, coffee, sports drinks, juices) Limit intake of fried foods and sweets/desserts Make healthy food choices Eat 3 meals day,  1 snacks a day-at bedtime or mid afternoon if eating late supper Space meals 4-6 hours apart Make dentist appointment Bring blood sugar records to the next appointment/class Call your doctor for a prescription for:  1. Meter strips (type)  One Touch Verio test strips  checking  2 times per day  2. Lancets (type)  One Touch Delica Lancets  checking  2   times per day Get a Sharps container Return for appointment/classes on:  01-05-16   Expected Outcomes:  Demonstrated interest in learning. Expect positive outcomes  Education material provided:  general meal planning guidelines, A1C handout, One Touch Verio Flex meter If problems or questions, patient to contact team via: [phone]  Future DSME appointment:

## 2015-12-20 NOTE — Patient Instructions (Signed)
  Check blood sugars 2 x day before breakfast and 2 hrs after supper every day Exercise:  walk for   10-15  minutes   3  days a week Avoid sugar sweetened drinks (soda, tea, coffee, sports drinks, juices) Limit intake of fried foods and sweets/desserts Make healthy food choices Eat 3 meals day,  1 snacks a day-at bedtime or mid afternoon if eating late supper Space meals 4-6 hours apart Make dentist appointment Bring blood sugar records to the next appointment/class Call your doctor for a prescription for:  1. Meter strips (type)  One Touch Verio test strips  checking  2 times per day  2. Lancets (type)  One Touch Delica Lancets  checking  2   times per day Get a Sharps container Return for appointment/classes on:  01-05-16

## 2016-01-05 ENCOUNTER — Encounter: Payer: PPO | Attending: Family Medicine | Admitting: Dietician

## 2016-01-05 ENCOUNTER — Encounter: Payer: Self-pay | Admitting: Dietician

## 2016-01-05 VITALS — Wt 217.8 lb

## 2016-01-05 DIAGNOSIS — E119 Type 2 diabetes mellitus without complications: Secondary | ICD-10-CM | POA: Diagnosis not present

## 2016-01-05 NOTE — Progress Notes (Signed)

## 2016-01-12 ENCOUNTER — Encounter: Payer: Self-pay | Admitting: *Deleted

## 2016-01-12 ENCOUNTER — Encounter: Payer: PPO | Admitting: *Deleted

## 2016-01-12 VITALS — Wt 217.3 lb

## 2016-01-12 DIAGNOSIS — E119 Type 2 diabetes mellitus without complications: Secondary | ICD-10-CM | POA: Diagnosis not present

## 2016-01-12 NOTE — Progress Notes (Signed)

## 2016-01-19 ENCOUNTER — Encounter: Payer: PPO | Admitting: Dietician

## 2016-01-19 VITALS — BP 126/80 | Wt 216.8 lb

## 2016-01-19 DIAGNOSIS — E119 Type 2 diabetes mellitus without complications: Secondary | ICD-10-CM | POA: Diagnosis not present

## 2016-01-19 NOTE — Progress Notes (Signed)

## 2016-01-23 ENCOUNTER — Encounter: Payer: Self-pay | Admitting: Dietician

## 2016-02-22 DIAGNOSIS — E785 Hyperlipidemia, unspecified: Secondary | ICD-10-CM | POA: Diagnosis not present

## 2016-02-22 DIAGNOSIS — E119 Type 2 diabetes mellitus without complications: Secondary | ICD-10-CM | POA: Diagnosis not present

## 2016-02-24 DIAGNOSIS — E119 Type 2 diabetes mellitus without complications: Secondary | ICD-10-CM | POA: Diagnosis not present

## 2016-02-24 DIAGNOSIS — E785 Hyperlipidemia, unspecified: Secondary | ICD-10-CM | POA: Diagnosis not present

## 2016-04-03 DIAGNOSIS — B0233 Zoster keratitis: Secondary | ICD-10-CM | POA: Diagnosis not present

## 2016-05-16 DIAGNOSIS — E785 Hyperlipidemia, unspecified: Secondary | ICD-10-CM | POA: Diagnosis not present

## 2016-05-16 DIAGNOSIS — E119 Type 2 diabetes mellitus without complications: Secondary | ICD-10-CM | POA: Diagnosis not present

## 2016-05-23 DIAGNOSIS — M545 Low back pain: Secondary | ICD-10-CM | POA: Diagnosis not present

## 2016-05-23 DIAGNOSIS — E119 Type 2 diabetes mellitus without complications: Secondary | ICD-10-CM | POA: Diagnosis not present

## 2016-05-23 DIAGNOSIS — E785 Hyperlipidemia, unspecified: Secondary | ICD-10-CM | POA: Diagnosis not present

## 2016-05-23 DIAGNOSIS — J069 Acute upper respiratory infection, unspecified: Secondary | ICD-10-CM | POA: Diagnosis not present

## 2016-06-14 DIAGNOSIS — R300 Dysuria: Secondary | ICD-10-CM | POA: Diagnosis not present

## 2016-07-30 DIAGNOSIS — R300 Dysuria: Secondary | ICD-10-CM | POA: Diagnosis not present

## 2016-10-01 DIAGNOSIS — E119 Type 2 diabetes mellitus without complications: Secondary | ICD-10-CM | POA: Diagnosis not present

## 2016-11-26 DIAGNOSIS — Z0000 Encounter for general adult medical examination without abnormal findings: Secondary | ICD-10-CM | POA: Diagnosis not present

## 2016-12-25 DIAGNOSIS — R300 Dysuria: Secondary | ICD-10-CM | POA: Diagnosis not present

## 2017-03-27 DIAGNOSIS — Z0000 Encounter for general adult medical examination without abnormal findings: Secondary | ICD-10-CM | POA: Diagnosis not present

## 2017-04-01 DIAGNOSIS — Z0000 Encounter for general adult medical examination without abnormal findings: Secondary | ICD-10-CM | POA: Diagnosis not present

## 2017-04-01 DIAGNOSIS — M79605 Pain in left leg: Secondary | ICD-10-CM | POA: Diagnosis not present

## 2017-04-01 DIAGNOSIS — E119 Type 2 diabetes mellitus without complications: Secondary | ICD-10-CM | POA: Diagnosis not present

## 2017-04-01 DIAGNOSIS — M545 Low back pain: Secondary | ICD-10-CM | POA: Diagnosis not present

## 2017-04-01 DIAGNOSIS — E785 Hyperlipidemia, unspecified: Secondary | ICD-10-CM | POA: Diagnosis not present

## 2017-04-02 ENCOUNTER — Other Ambulatory Visit: Payer: Self-pay | Admitting: Family Medicine

## 2017-04-02 DIAGNOSIS — Z1231 Encounter for screening mammogram for malignant neoplasm of breast: Secondary | ICD-10-CM

## 2017-04-02 DIAGNOSIS — B0233 Zoster keratitis: Secondary | ICD-10-CM | POA: Diagnosis not present

## 2017-04-03 DIAGNOSIS — M9903 Segmental and somatic dysfunction of lumbar region: Secondary | ICD-10-CM | POA: Diagnosis not present

## 2017-04-03 DIAGNOSIS — M791 Myalgia, unspecified site: Secondary | ICD-10-CM | POA: Diagnosis not present

## 2017-04-03 DIAGNOSIS — Z72820 Sleep deprivation: Secondary | ICD-10-CM | POA: Diagnosis not present

## 2017-04-03 DIAGNOSIS — M9902 Segmental and somatic dysfunction of thoracic region: Secondary | ICD-10-CM | POA: Diagnosis not present

## 2017-04-03 DIAGNOSIS — M6240 Contracture of muscle, unspecified site: Secondary | ICD-10-CM | POA: Diagnosis not present

## 2017-04-03 DIAGNOSIS — M5430 Sciatica, unspecified side: Secondary | ICD-10-CM | POA: Diagnosis not present

## 2017-04-03 DIAGNOSIS — M9905 Segmental and somatic dysfunction of pelvic region: Secondary | ICD-10-CM | POA: Diagnosis not present

## 2017-04-23 ENCOUNTER — Ambulatory Visit
Admission: RE | Admit: 2017-04-23 | Discharge: 2017-04-23 | Disposition: A | Discharge status: Home / Self Care | Payer: PPO | Source: Ambulatory Visit | Attending: Family Medicine | Admitting: Family Medicine

## 2017-04-23 DIAGNOSIS — Z1231 Encounter for screening mammogram for malignant neoplasm of breast: Secondary | ICD-10-CM | POA: Insufficient documentation

## 2017-04-30 ENCOUNTER — Other Ambulatory Visit: Payer: Self-pay | Admitting: Family Medicine

## 2017-04-30 DIAGNOSIS — R928 Other abnormal and inconclusive findings on diagnostic imaging of breast: Secondary | ICD-10-CM

## 2017-04-30 DIAGNOSIS — N6320 Unspecified lump in the left breast, unspecified quadrant: Secondary | ICD-10-CM

## 2017-05-15 ENCOUNTER — Ambulatory Visit
Admission: RE | Admit: 2017-05-15 | Discharge: 2017-05-15 | Disposition: A | Discharge status: Home / Self Care | Payer: PPO | Source: Ambulatory Visit | Attending: Family Medicine | Admitting: Family Medicine

## 2017-05-15 DIAGNOSIS — N6324 Unspecified lump in the left breast, lower inner quadrant: Secondary | ICD-10-CM | POA: Diagnosis not present

## 2017-05-15 DIAGNOSIS — N6323 Unspecified lump in the left breast, lower outer quadrant: Secondary | ICD-10-CM | POA: Diagnosis not present

## 2017-05-15 DIAGNOSIS — R928 Other abnormal and inconclusive findings on diagnostic imaging of breast: Secondary | ICD-10-CM

## 2017-05-15 DIAGNOSIS — N6321 Unspecified lump in the left breast, upper outer quadrant: Secondary | ICD-10-CM | POA: Diagnosis not present

## 2017-05-15 DIAGNOSIS — N6320 Unspecified lump in the left breast, unspecified quadrant: Secondary | ICD-10-CM

## 2017-05-16 DIAGNOSIS — E213 Hyperparathyroidism, unspecified: Secondary | ICD-10-CM | POA: Diagnosis not present

## 2017-05-17 ENCOUNTER — Other Ambulatory Visit: Payer: Self-pay | Admitting: Family Medicine

## 2017-05-17 DIAGNOSIS — N6320 Unspecified lump in the left breast, unspecified quadrant: Secondary | ICD-10-CM

## 2017-05-17 DIAGNOSIS — R928 Other abnormal and inconclusive findings on diagnostic imaging of breast: Secondary | ICD-10-CM

## 2017-05-23 ENCOUNTER — Ambulatory Visit
Admission: RE | Admit: 2017-05-23 | Discharge: 2017-05-23 | Disposition: A | Discharge status: Home / Self Care | Payer: PPO | Source: Ambulatory Visit | Attending: Family Medicine | Admitting: Family Medicine

## 2017-05-23 DIAGNOSIS — N6321 Unspecified lump in the left breast, upper outer quadrant: Secondary | ICD-10-CM | POA: Diagnosis not present

## 2017-05-23 DIAGNOSIS — R928 Other abnormal and inconclusive findings on diagnostic imaging of breast: Secondary | ICD-10-CM

## 2017-05-23 DIAGNOSIS — N6320 Unspecified lump in the left breast, unspecified quadrant: Secondary | ICD-10-CM

## 2017-05-23 DIAGNOSIS — N6323 Unspecified lump in the left breast, lower outer quadrant: Secondary | ICD-10-CM | POA: Insufficient documentation

## 2017-05-23 DIAGNOSIS — D242 Benign neoplasm of left breast: Secondary | ICD-10-CM | POA: Diagnosis not present

## 2017-05-23 HISTORY — PX: BREAST BIOPSY: SHX20

## 2017-05-24 LAB — SURGICAL PATHOLOGY

## 2017-06-07 DIAGNOSIS — E213 Hyperparathyroidism, unspecified: Secondary | ICD-10-CM | POA: Diagnosis not present

## 2017-06-11 ENCOUNTER — Encounter: Payer: Self-pay | Admitting: *Deleted

## 2017-06-18 ENCOUNTER — Inpatient Hospital Stay: Payer: Self-pay

## 2017-06-18 ENCOUNTER — Ambulatory Visit (INDEPENDENT_AMBULATORY_CARE_PROVIDER_SITE_OTHER): Payer: PPO | Admitting: General Surgery

## 2017-06-18 ENCOUNTER — Encounter: Payer: Self-pay | Admitting: General Surgery

## 2017-06-18 VITALS — BP 124/70 | HR 76 | Resp 16 | Ht 65.5 in | Wt 216.0 lb

## 2017-06-18 DIAGNOSIS — N6321 Unspecified lump in the left breast, upper outer quadrant: Secondary | ICD-10-CM

## 2017-06-18 DIAGNOSIS — J029 Acute pharyngitis, unspecified: Secondary | ICD-10-CM | POA: Diagnosis not present

## 2017-06-18 DIAGNOSIS — D242 Benign neoplasm of left breast: Secondary | ICD-10-CM

## 2017-06-18 NOTE — Progress Notes (Signed)
Patient ID: Charlotte Burns, female   DOB: [birth year], 72 y.o.   MRN: [id number]  Chief Complaint  Patient presents with  . Breast Problem    HPI Charlotte Burns is a 72 y.o. female.  who presents for a breast evaluation. The most recent mammogram was done on 05-15-17 . Left breast biopsy was 05-23-17 showing benign lesion that needs to be removed.. Patient does perform regular self breast checks and gets regular mammograms done.   She could not feel anything different in the breast prior to the mammogram. She state she needs to have her parathyroid surgery.  HPI  Past Medical History:  Diagnosis Date  . Arthritis   . Chest tightness    ECHO AND STRESS   . DJD (degenerative joint disease)   . GERD (gastroesophageal reflux disease)   . H/O hypercholesterolemia    told one time  . History of shingles    left side of face jan 2015  . Pneumonia    hx of  . Shortness of breath    occasional, usually with activity    Past Surgical History:  Procedure Laterality Date  . BREAST BIOPSY Left 04/29/2012   core, INTRA DUCTAL PAPILLOMA WITH SCLEROSIS AND ASSOCIATED   . BREAST BIOPSY Left 05/23/2017   INTRADUCTAL PAPILLOMA WITH USUAL DUCTAL HYPERPLASIA  . CATARACT EXTRACTION Bilateral 2013  . COLONOSCOPY    . DILATION AND CURETTAGE OF UTERUS    . TOTAL HIP ARTHROPLASTY  02/25/2012   Procedure: TOTAL HIP ARTHROPLASTY;  Surgeon: Gearlean Alf, MD;  Location: [facility];  Service: Orthopedics;  Laterality: Right;  . TOTAL KNEE ARTHROPLASTY Left 01/25/2014   Procedure: LEFT TOTAL KNEE ARTHROPLASTY WITH RIGHT KNEE INJECTION;  Surgeon: Gearlean Alf, MD;  Location: [facility];  Service: Orthopedics;  Laterality: Left;    Family History  Problem Relation Age of Onset  . Colon cancer Father 66  . Cancer Sister        ? colon  . Cancer - Lung Brother   . Breast cancer Neg Hx     Social History Social History   Tobacco Use  . Smoking status: Former Smoker    Packs/day: 2.00     Years: 20.00    Pack years: 40.00    Types: Cigarettes    Last attempt to quit: 05/04/1973    Years since quitting: 44.1  . Smokeless tobacco: Never Used  Substance Use Topics  . Alcohol use: No  . Drug use: No    No Known Allergies  Current Outpatient Medications  Medication Sig Dispense Refill  . acetaminophen (TYLENOL) 500 MG tablet Take 1,000 mg by mouth every 6 (six) hours as needed.    . diclofenac (VOLTAREN) 75 MG EC tablet Take 1 tablet by mouth 2 (two) times daily.    Marland Kitchen gabapentin (NEURONTIN) 300 MG capsule Take 600 mg by mouth at bedtime.    . polyvinyl alcohol (LIQUIFILM TEARS) 1.4 % ophthalmic solution Place 1 drop into both eyes daily as needed for dry eyes.    . prednisoLONE acetate (PRED MILD) 0.12 % ophthalmic suspension 1 drop 4 (four) times daily.    . ranitidine (ZANTAC) 150 MG tablet Take 150 mg by mouth every morning.     No current facility-administered medications for this visit.     Review of Systems Review of Systems  Constitutional: Negative.   Respiratory: Positive for cough.   Cardiovascular: Negative.     Blood pressure 124/70, pulse 76, resp. rate 16,  height 5' 5.5" (1.664 m), weight 216 lb (98 kg), SpO2 97 %.  Physical Exam Physical Exam  Constitutional: She is oriented to person, place, and time. She appears well-developed and well-nourished.  HENT:  Mouth/Throat: Oropharynx is clear and moist.  Eyes: Conjunctivae are normal. No scleral icterus.  Neck: Neck supple.  Cardiovascular: Normal rate, regular rhythm and normal heart sounds.  Pulmonary/Chest: Effort normal and breath sounds normal. Right breast exhibits no inverted nipple, no mass, no nipple discharge, no skin change and no tenderness. Left breast exhibits no inverted nipple, no mass, no nipple discharge, no skin change and no tenderness.  Left breast > right breast.  Lymphadenopathy:    She has no cervical adenopathy.    She has no axillary adenopathy.  Neurological: She is alert  and oriented to person, place, and time.  Skin: Skin is warm and dry.  Psychiatric: Her behavior is normal.    Data Reviewed March 22, 2010 core biopsy for a irregular mass at the 3 o'clock position of the left breast: Diagnosis:  LEFT BREAST BIOPSY:  - PROLIFERATIVE FIBROCYSTIC CHANGE.  - NEGATIVE FOR ATYPIA AND MALIGNANCY.  .  Comment  Deeper sections were examined. Sections reveal breast tissue with  apocrine metaplasia, columnar cell changes and florid ductal  hyperplasia. The specimen is fragmented and partial sampling  of an intraductal papilloma cannot be entirely excluded.   April 28, 2012 left breast stereotactic biopsy for microcalcifications: LEFT BREAST, BIOPSY:  - INTRA DUCTAL PAPILLOMA WITH SCLEROSIS AND ASSOCIATED  CALCIFICATIONS.  - ADJACENT BREAST TISSUE WITH APOCRINE METAPLASIA AND USUAL  DUCTAL HYPERPLASIA.  - NEGATIVE FOR ATYPIA AND MALIGNANCY.   May 23, 2017 left breast biopsy at the 3 o'clock position: A. BREAST, LEFT, 3 O'CLOCK 6 CM FROM NIPPLE; ULTRASOUND-GUIDED CORE  BIOPSY:  - INTRADUCTAL PAPILLOMA WITH USUAL DUCTAL HYPERPLASIA.  - NEGATIVE FOR ATYPIA AND MALIGNANCY.   Mammograms from April 23, 2017 through May 23, 2017 when the patient underwent ultrasound-guided core biopsy of the left breast were reviewed.  There are at least 3 biopsy clips in the left breast.  One in the retroareolar area (top hat) and 2 in the lateral aspect of the breast separated by approximately 3 cm including a ribbon and a coil from the most recent biopsy are identified.  Ultrasound examination of the left breast in the 3 o'clock position 6 cm from the nipple was completed for localization should formal excision or complete vacuum biopsy be undertaken.  A 0.63 x 0.74 x 0.8 cm slightly irregular hypoechoic mass with evidence of previous biopsy site is evident.  BI-RADS-3.  Assessment    Multiple previous biopsies for intraductal papilloma.    Plan     Sampling volume of the 2011 and 2018 biopsy sites is small, less than 0.1 cm of tissue.  I recommended that the patient consider either complete vacuum biopsy with removal or formal excision to confirm the biopsy results.  With her past history of biopsies in 2011 and 2013, she is less interested in proceeding to formal excision or even completion vacuum biopsy.  She was amenable to a period of observation.     Discussed options of observation vs extensive biopsy vs completed excision.   Follow up in June with office ultraosund unless she decides to proceed to excision (vacuum/surgical) in the interval.  The risk for observation based on her past history is small.  HPI, Physical Exam, Assessment and Plan have been scribed under the direction and in the presence  of Robert Bellow, MD. Karie Fetch, RN   I have completed the exam and reviewed the above documentation for accuracy and completeness.  I agree with the above.  Haematologist has been used and any errors in dictation or transcription are unintentional.  Hervey Ard, M.D., F.A.C.S. [facility] Celese Banner 06/19/2017, 2:32 PM

## 2017-06-18 NOTE — Patient Instructions (Addendum)
The patient is aware to call back for any questions or new concerns.  Discussed options of observation vs extensive biopsy vs completed excision. She will call back with her decision Follow up in June with office ultraosund

## 2017-06-19 DIAGNOSIS — D242 Benign neoplasm of left breast: Secondary | ICD-10-CM | POA: Insufficient documentation

## 2017-07-11 DIAGNOSIS — M25511 Pain in right shoulder: Secondary | ICD-10-CM | POA: Diagnosis not present

## 2017-07-18 DIAGNOSIS — M25511 Pain in right shoulder: Secondary | ICD-10-CM | POA: Diagnosis not present

## 2017-09-13 ENCOUNTER — Encounter: Payer: Self-pay | Admitting: *Deleted

## 2017-10-08 DIAGNOSIS — M79621 Pain in right upper arm: Secondary | ICD-10-CM | POA: Diagnosis not present

## 2017-10-08 DIAGNOSIS — R42 Dizziness and giddiness: Secondary | ICD-10-CM | POA: Diagnosis not present

## 2017-10-23 ENCOUNTER — Other Ambulatory Visit: Payer: Self-pay | Admitting: Sports Medicine

## 2017-10-23 DIAGNOSIS — M19011 Primary osteoarthritis, right shoulder: Secondary | ICD-10-CM

## 2017-10-23 DIAGNOSIS — M7541 Impingement syndrome of right shoulder: Secondary | ICD-10-CM | POA: Diagnosis not present

## 2017-10-23 DIAGNOSIS — G8929 Other chronic pain: Secondary | ICD-10-CM | POA: Diagnosis not present

## 2017-10-23 DIAGNOSIS — M25511 Pain in right shoulder: Secondary | ICD-10-CM

## 2017-11-11 DIAGNOSIS — E559 Vitamin D deficiency, unspecified: Secondary | ICD-10-CM | POA: Diagnosis not present

## 2017-11-11 DIAGNOSIS — E213 Hyperparathyroidism, unspecified: Secondary | ICD-10-CM | POA: Diagnosis not present

## 2017-11-16 ENCOUNTER — Ambulatory Visit
Admission: RE | Admit: 2017-11-16 | Discharge: 2017-11-16 | Disposition: A | Discharge status: Home / Self Care | Payer: PPO | Source: Ambulatory Visit | Attending: Sports Medicine | Admitting: Sports Medicine

## 2017-11-16 DIAGNOSIS — M19011 Primary osteoarthritis, right shoulder: Secondary | ICD-10-CM | POA: Diagnosis not present

## 2017-11-16 DIAGNOSIS — G8929 Other chronic pain: Secondary | ICD-10-CM | POA: Diagnosis not present

## 2017-11-16 DIAGNOSIS — M25511 Pain in right shoulder: Secondary | ICD-10-CM | POA: Insufficient documentation

## 2017-11-16 DIAGNOSIS — M7541 Impingement syndrome of right shoulder: Secondary | ICD-10-CM

## 2017-11-16 DIAGNOSIS — M75121 Complete rotator cuff tear or rupture of right shoulder, not specified as traumatic: Secondary | ICD-10-CM | POA: Insufficient documentation

## 2017-11-20 DIAGNOSIS — M25511 Pain in right shoulder: Secondary | ICD-10-CM | POA: Diagnosis not present

## 2017-11-20 DIAGNOSIS — G8929 Other chronic pain: Secondary | ICD-10-CM | POA: Diagnosis not present

## 2017-11-21 ENCOUNTER — Ambulatory Visit: Payer: PPO | Admitting: General Surgery

## 2017-11-28 DIAGNOSIS — E213 Hyperparathyroidism, unspecified: Secondary | ICD-10-CM | POA: Diagnosis not present

## 2017-11-28 DIAGNOSIS — E559 Vitamin D deficiency, unspecified: Secondary | ICD-10-CM | POA: Diagnosis not present

## 2017-11-28 DIAGNOSIS — E041 Nontoxic single thyroid nodule: Secondary | ICD-10-CM | POA: Diagnosis not present

## 2017-12-24 ENCOUNTER — Ambulatory Visit: Payer: PPO | Admitting: General Surgery

## 2018-01-01 DIAGNOSIS — G8929 Other chronic pain: Secondary | ICD-10-CM | POA: Diagnosis not present

## 2018-01-01 DIAGNOSIS — M25511 Pain in right shoulder: Secondary | ICD-10-CM | POA: Diagnosis not present

## 2018-01-17 DIAGNOSIS — M25611 Stiffness of right shoulder, not elsewhere classified: Secondary | ICD-10-CM | POA: Diagnosis not present

## 2018-01-17 DIAGNOSIS — M6281 Muscle weakness (generalized): Secondary | ICD-10-CM | POA: Diagnosis not present

## 2018-01-17 DIAGNOSIS — M25511 Pain in right shoulder: Secondary | ICD-10-CM | POA: Diagnosis not present

## 2018-01-21 DIAGNOSIS — M25511 Pain in right shoulder: Secondary | ICD-10-CM | POA: Diagnosis not present

## 2018-01-23 ENCOUNTER — Ambulatory Visit: Payer: Self-pay

## 2018-01-23 ENCOUNTER — Ambulatory Visit (INDEPENDENT_AMBULATORY_CARE_PROVIDER_SITE_OTHER): Payer: PPO | Admitting: General Surgery

## 2018-01-23 ENCOUNTER — Encounter: Payer: Self-pay | Admitting: General Surgery

## 2018-01-23 VITALS — BP 132/74 | HR 86 | Resp 15 | Ht 67.0 in | Wt 217.0 lb

## 2018-01-23 DIAGNOSIS — D242 Benign neoplasm of left breast: Secondary | ICD-10-CM | POA: Diagnosis not present

## 2018-01-23 NOTE — Progress Notes (Signed)
Patient ID: Charlotte Burns, female   DOB: 08-27-1945, 72 y.o.   MRN: [id number]  Chief Complaint  Patient presents with  . Other    HPI [name] is a 72 y.o. female here today for her follow up left breast ultrasound .  HPI  Past Medical History:  Diagnosis Date  . Arthritis   . Chest tightness    ECHO AND STRESS   . DJD (degenerative joint disease)   . GERD (gastroesophageal reflux disease)   . H/O hypercholesterolemia    told one time  . History of shingles    left side of face jan 2015  . Pneumonia    hx of  . Shortness of breath    occasional, usually with activity    Past Surgical History:  Procedure Laterality Date  . BREAST BIOPSY Left 04/29/2012   core, INTRA DUCTAL PAPILLOMA WITH SCLEROSIS AND ASSOCIATED   . BREAST BIOPSY Left 05/23/2017   INTRADUCTAL PAPILLOMA WITH USUAL DUCTAL HYPERPLASIA  . CATARACT EXTRACTION Bilateral 2013  . COLONOSCOPY    . DILATION AND CURETTAGE OF UTERUS    . TOTAL HIP ARTHROPLASTY  02/25/2012   Procedure: TOTAL HIP ARTHROPLASTY;  Surgeon: Gearlean Alf, MD;  Location: [facility];  Service: Orthopedics;  Laterality: Right;  . TOTAL KNEE ARTHROPLASTY Left 01/25/2014   Procedure: LEFT TOTAL KNEE ARTHROPLASTY WITH RIGHT KNEE INJECTION;  Surgeon: Gearlean Alf, MD;  Location: [facility];  Service: Orthopedics;  Laterality: Left;    Family History  Problem Relation Age of Onset  . Colon cancer Father 22  . Cancer Sister        ? colon  . Cancer - Lung Brother   . Breast cancer Neg Hx     Social History Social History   Tobacco Use  . Smoking status: Former Smoker    Packs/day: 2.00    Years: 20.00    Pack years: 40.00    Types: Cigarettes    Last attempt to quit: 05/04/1973    Years since quitting: 44.7  . Smokeless tobacco: Never Used  Substance Use Topics  . Alcohol use: No  . Drug use: No    No Known Allergies  Current Outpatient Medications  Medication Sig Dispense Refill  . acetaminophen (TYLENOL) 500 MG  tablet Take 1,000 mg by mouth every 6 (six) hours as needed.    . diclofenac (VOLTAREN) 75 MG EC tablet Take 1 tablet by mouth 2 (two) times daily.    Marland Kitchen gabapentin (NEURONTIN) 300 MG capsule Take 600 mg by mouth at bedtime.    . polyvinyl alcohol (LIQUIFILM TEARS) 1.4 % ophthalmic solution Place 1 drop into both eyes daily as needed for dry eyes.    . prednisoLONE acetate (PRED MILD) 0.12 % ophthalmic suspension 1 drop 4 (four) times daily.    . ranitidine (ZANTAC) 150 MG tablet Take 150 mg by mouth every morning.     No current facility-administered medications for this visit.     Review of Systems Review of Systems  Constitutional: Negative.   Respiratory: Negative.   Cardiovascular: Negative.     Blood pressure 132/74, pulse 86, resp. rate 15, height 5\' 7"  (1.702 m), weight 217 lb (98.4 kg).  Physical Exam Physical Exam  Constitutional: She is oriented to person, place, and time. She appears well-developed and well-nourished.  Eyes: Conjunctivae are normal. No scleral icterus.  Neck: Neck supple.  Cardiovascular: Normal rate, regular rhythm and normal heart sounds.  Pulmonary/Chest: Effort normal and breath  sounds normal. Right breast exhibits no inverted nipple, no mass, no nipple discharge, no skin change and no tenderness. Left breast exhibits no inverted nipple, no mass, no nipple discharge, no skin change and no tenderness.    Lymphadenopathy:    She has no cervical adenopathy.    She has no axillary adenopathy.       Left: No supraclavicular adenopathy present.  Neurological: She is alert and oriented to person, place, and time.  Skin: Skin is warm and dry.  Psychiatric: Her behavior is normal.    Data Reviewed May 23, 2017 biopsy A. BREAST, LEFT, 3 O'CLOCK 6 CM FROM NIPPLE; ULTRASOUND-GUIDED CORE  BIOPSY:  - INTRADUCTAL PAPILLOMA WITH USUAL DUCTAL HYPERPLASIA.  - NEGATIVE FOR ATYPIA AND MALIGNANCY.   Ultrasound examination was completed to determine if the  area of concern has increased in size.  In the left breast at the 3 o'clock position, 6 cm from the nipple a softly lobulated 0.64 x 0.71 x 0.77 cm hypoechoic nodule with faint posterior acoustic enhancement and sharp edge effect is identified.  Unchanged from December 2018.  BI-RADS-3.  Assessment    Left breast papilloma without atypia.    Plan  When originally evaluated in January of this year excision or completion vacuum biopsy was recommended.  The patient declined both procedures.  The area is stable in size compared to prebiopsy imaging.  We will arrange for bilateral diagnostic mammograms in December of this year and follow-up afterwards.    The patient is aware to call back for any questions or new concerns. The patient has been asked to return to the office in 4 months with a bilateral diagnostic mammogram.  HPI, Physical Exam, Assessment and Plan have been scribed under the direction and in the presence of Hervey Ard, MD.  Gaspar Cola, CMA  I have completed the exam and reviewed the above documentation for accuracy and completeness.  I agree with the above.  Haematologist has been used and any errors in dictation or transcription are unintentional.  Hervey Ard, M.D., F.A.C.S.  [facility] Micael Barb 01/23/2018, 8:42 PM

## 2018-01-23 NOTE — Patient Instructions (Addendum)
The patient is aware to call back for any questions or new concerns. The patient has been asked to return to the office in 4 months with a bilateral diagnostic mammogram.

## 2018-03-24 ENCOUNTER — Other Ambulatory Visit: Payer: Self-pay

## 2018-03-24 DIAGNOSIS — D242 Benign neoplasm of left breast: Secondary | ICD-10-CM

## 2018-05-19 ENCOUNTER — Other Ambulatory Visit: Payer: PPO

## 2018-05-29 ENCOUNTER — Ambulatory Visit: Payer: PPO | Admitting: General Surgery

## 2018-07-11 ENCOUNTER — Other Ambulatory Visit: Payer: Self-pay

## 2018-08-04 ENCOUNTER — Other Ambulatory Visit: Payer: PPO

## 2018-08-11 ENCOUNTER — Telehealth: Payer: Self-pay | Admitting: *Deleted

## 2018-08-11 NOTE — Telephone Encounter (Signed)
Left message for patient to the office back to reschedule her appointment with Dr.Byrnett. She needs to reschedule her mammogram then call us back to get another appointment.

## 2018-08-12 ENCOUNTER — Ambulatory Visit: Payer: PPO | Admitting: General Surgery

## 2018-10-15 DIAGNOSIS — Z0000 Encounter for general adult medical examination without abnormal findings: Secondary | ICD-10-CM | POA: Diagnosis not present

## 2018-10-15 DIAGNOSIS — Z23 Encounter for immunization: Secondary | ICD-10-CM | POA: Diagnosis not present

## 2018-10-15 DIAGNOSIS — I10 Essential (primary) hypertension: Secondary | ICD-10-CM | POA: Diagnosis not present

## 2018-10-15 DIAGNOSIS — E785 Hyperlipidemia, unspecified: Secondary | ICD-10-CM | POA: Diagnosis not present

## 2018-10-15 DIAGNOSIS — E1142 Type 2 diabetes mellitus with diabetic polyneuropathy: Secondary | ICD-10-CM | POA: Diagnosis not present

## 2018-10-20 DIAGNOSIS — E1142 Type 2 diabetes mellitus with diabetic polyneuropathy: Secondary | ICD-10-CM | POA: Diagnosis not present

## 2018-10-20 DIAGNOSIS — E785 Hyperlipidemia, unspecified: Secondary | ICD-10-CM | POA: Diagnosis not present

## 2018-10-20 DIAGNOSIS — I10 Essential (primary) hypertension: Secondary | ICD-10-CM | POA: Diagnosis not present

## 2018-11-07 DIAGNOSIS — R748 Abnormal levels of other serum enzymes: Secondary | ICD-10-CM | POA: Diagnosis not present

## 2018-11-17 ENCOUNTER — Encounter: Payer: Self-pay | Admitting: *Deleted

## 2018-11-20 ENCOUNTER — Other Ambulatory Visit: Payer: Self-pay | Admitting: Internal Medicine

## 2018-11-20 DIAGNOSIS — R748 Abnormal levels of other serum enzymes: Secondary | ICD-10-CM | POA: Diagnosis not present

## 2018-11-20 DIAGNOSIS — E669 Obesity, unspecified: Secondary | ICD-10-CM | POA: Diagnosis not present

## 2018-11-25 ENCOUNTER — Ambulatory Visit: Payer: PPO

## 2018-12-01 ENCOUNTER — Ambulatory Visit: Payer: PPO | Attending: Internal Medicine

## 2018-12-02 ENCOUNTER — Other Ambulatory Visit: Payer: Self-pay

## 2018-12-02 ENCOUNTER — Ambulatory Visit
Admission: RE | Admit: 2018-12-02 | Discharge: 2018-12-02 | Disposition: A | Discharge status: Home / Self Care | Payer: PPO | Source: Ambulatory Visit | Attending: Internal Medicine | Admitting: Internal Medicine

## 2018-12-02 ENCOUNTER — Other Ambulatory Visit: Payer: Self-pay | Admitting: Internal Medicine

## 2018-12-02 DIAGNOSIS — R748 Abnormal levels of other serum enzymes: Secondary | ICD-10-CM | POA: Diagnosis not present

## 2018-12-02 DIAGNOSIS — K8020 Calculus of gallbladder without cholecystitis without obstruction: Secondary | ICD-10-CM | POA: Diagnosis not present

## 2019-01-12 DIAGNOSIS — R748 Abnormal levels of other serum enzymes: Secondary | ICD-10-CM | POA: Diagnosis not present

## 2019-01-30 IMAGING — MG MM DIGITAL DIAGNOSTIC UNILAT*L* W/ TOMO W/ CAD
6 series · 6 of 14 positions shown · non-contrast
Comparison: Previous exam(s).

CLINICAL DATA: 71-year-old female recalled from screening mammogram
dated 04/23/2017 for a left breast mass. The patient had a previous
biopsy of the left breast mass at the 3 o'clock position in 9155
which demonstrated proliferative fibrocystic changes on pathology.

EXAM:
2D DIGITAL DIAGNOSTIC LEFT MAMMOGRAM WITH CAD AND ADJUNCT TOMO
ULTRASOUND LEFT BREAST

[L MLO synth-2D]
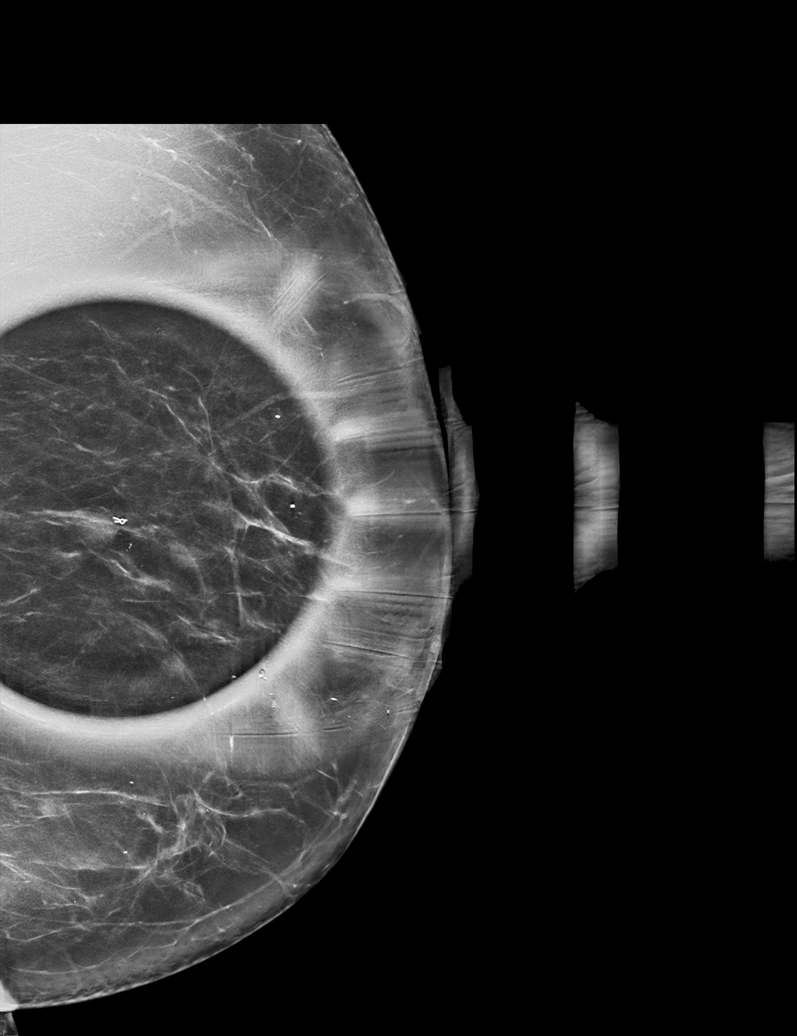

[L MLO]
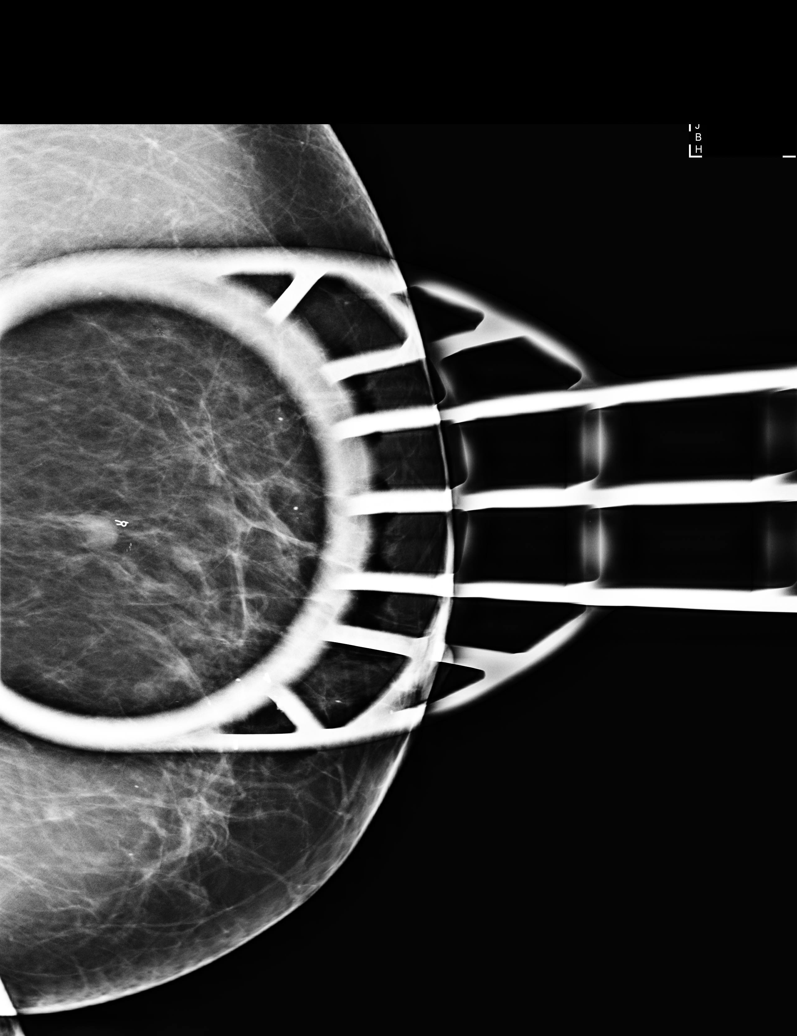

[L CC synth-2D]
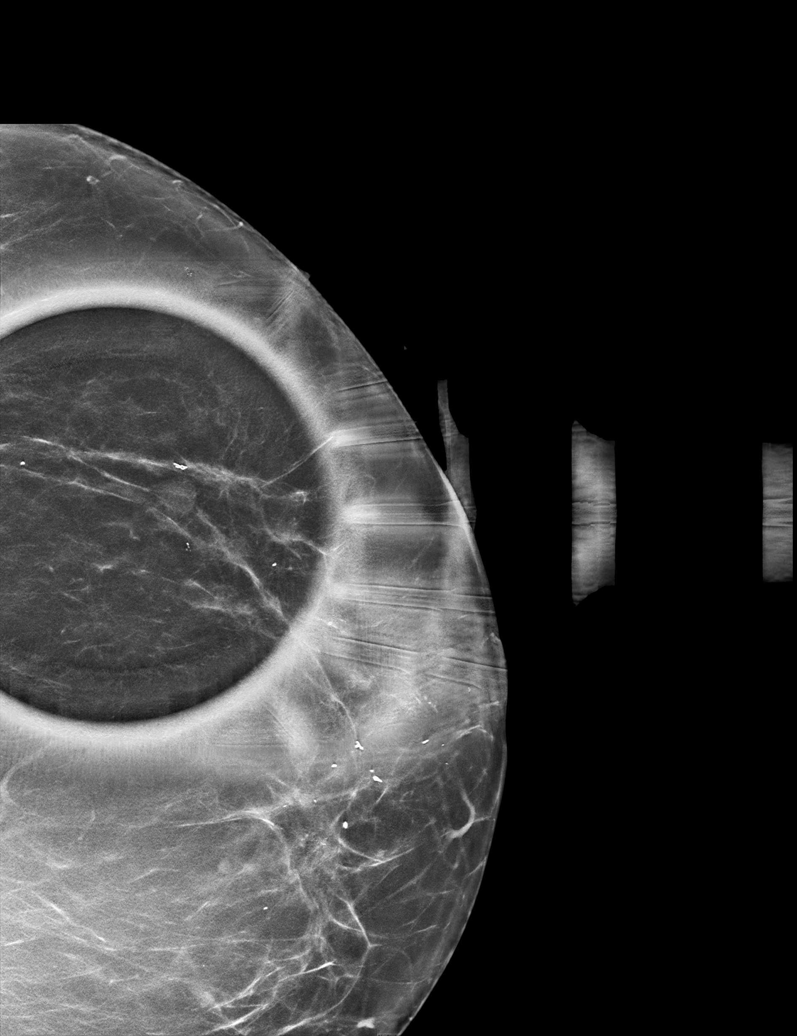

[L CC]
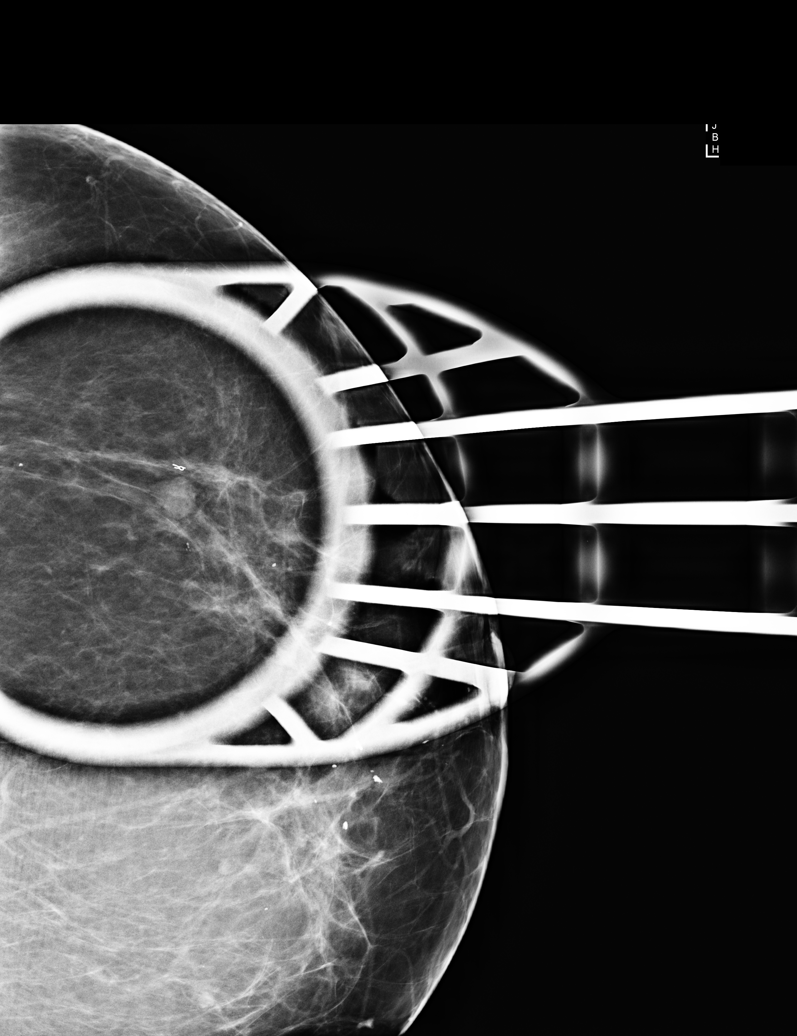

[L MLO tomo · tomo slice 37/73.0]
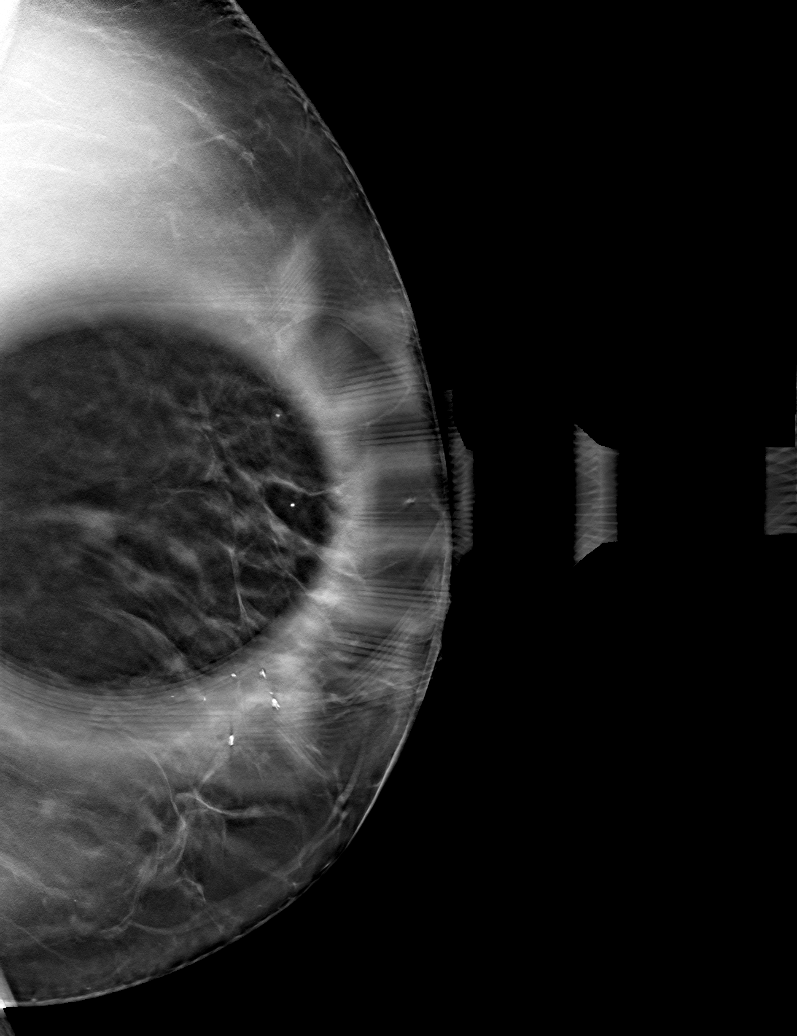

[L CC tomo · tomo slice 37/73.0]
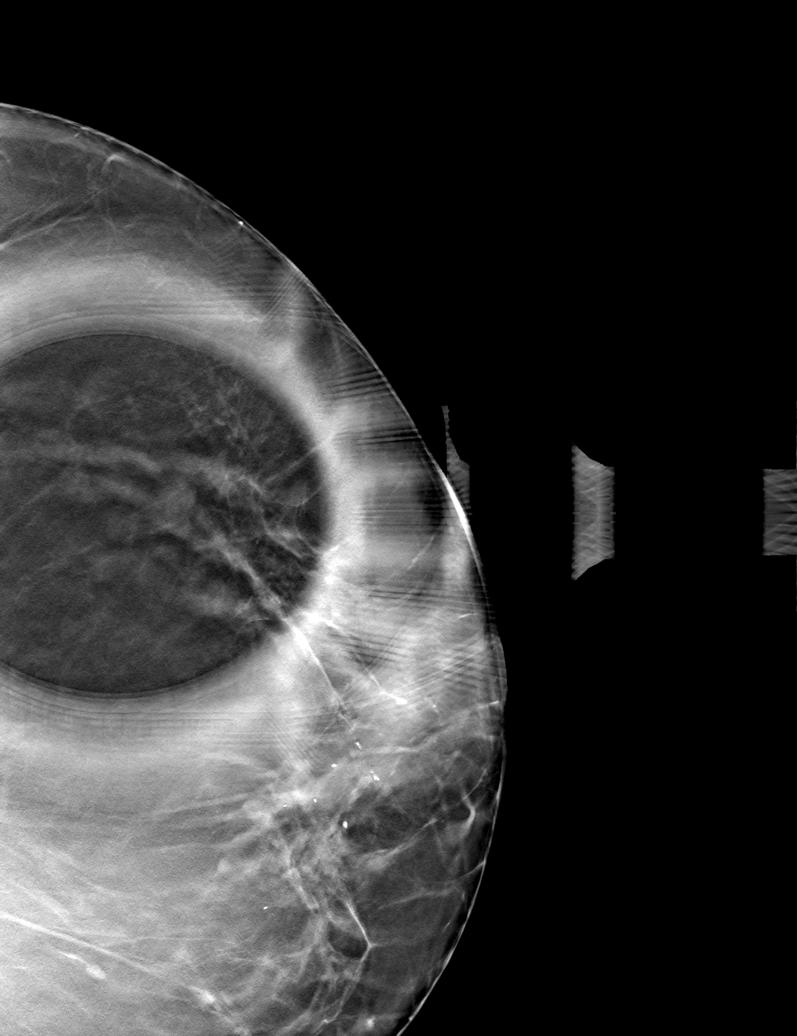

[6 of 14 positions shown; findings below may reference images not displayed]

ACR Breast Density Category b: There are scattered areas of
fibroglandular density.
FINDINGS: A partially circumscribed and partially obscured oval mass is
demonstrated in the central lateral left breast at middle depth. A
post biopsy clip is seen approximately 1.5 cm lateral to the mass.
Further evaluation with ultrasound was performed.

Mammographic images were processed with CAD.

Targeted ultrasound is performed, showing a circumscribed hypoechoic
mass with angular borders at the 3 o'clock position 6 cm from the
nipple. It measures 0.9 x 0.8 x 0.7 cm. Note is made of internal
vascularity. This corresponds with the mammographic finding.

Additional evaluation of the left axilla demonstrates no suspicious
adenopathy.
IMPRESSION: 1. Indeterminate left breast mass at the 3 o'clock position 6 cm
from the nipple. It is unclear if this is the area that was
previously biopsied in 9155 demonstrating benign pathology. However,
given the interval change in size and angular margins
sonographically, ultrasound-guided biopsy is recommended.
2. No suspicious left axillary lymphadenopathy.

RECOMMENDATION:
Ultrasound-guided biopsy of an indeterminate left breast mass.

I have discussed the findings and recommendations with the patient.
Results were also provided in writing at the conclusion of the
visit. If applicable, a reminder letter will be sent to the patient
regarding the next appointment.

BI-RADS CATEGORY  4: Suspicious.

## 2019-06-16 DIAGNOSIS — R8290 Unspecified abnormal findings in urine: Secondary | ICD-10-CM | POA: Diagnosis not present

## 2019-06-16 DIAGNOSIS — R300 Dysuria: Secondary | ICD-10-CM | POA: Diagnosis not present

## 2019-08-25 DIAGNOSIS — E119 Type 2 diabetes mellitus without complications: Secondary | ICD-10-CM | POA: Diagnosis not present

## 2019-10-19 DIAGNOSIS — Z0000 Encounter for general adult medical examination without abnormal findings: Secondary | ICD-10-CM | POA: Diagnosis not present

## 2019-10-19 DIAGNOSIS — G609 Hereditary and idiopathic neuropathy, unspecified: Secondary | ICD-10-CM | POA: Diagnosis not present

## 2019-10-19 DIAGNOSIS — E785 Hyperlipidemia, unspecified: Secondary | ICD-10-CM | POA: Diagnosis not present

## 2019-10-19 DIAGNOSIS — Z1159 Encounter for screening for other viral diseases: Secondary | ICD-10-CM | POA: Diagnosis not present

## 2019-10-19 DIAGNOSIS — Z1211 Encounter for screening for malignant neoplasm of colon: Secondary | ICD-10-CM | POA: Diagnosis not present

## 2019-10-19 DIAGNOSIS — I10 Essential (primary) hypertension: Secondary | ICD-10-CM | POA: Diagnosis not present

## 2019-10-19 DIAGNOSIS — Z8639 Personal history of other endocrine, nutritional and metabolic disease: Secondary | ICD-10-CM | POA: Diagnosis not present

## 2019-10-19 DIAGNOSIS — M79672 Pain in left foot: Secondary | ICD-10-CM | POA: Diagnosis not present

## 2019-10-19 DIAGNOSIS — E1142 Type 2 diabetes mellitus with diabetic polyneuropathy: Secondary | ICD-10-CM | POA: Diagnosis not present

## 2019-10-20 DIAGNOSIS — E1142 Type 2 diabetes mellitus with diabetic polyneuropathy: Secondary | ICD-10-CM | POA: Diagnosis not present

## 2019-10-20 DIAGNOSIS — Z8639 Personal history of other endocrine, nutritional and metabolic disease: Secondary | ICD-10-CM | POA: Diagnosis not present

## 2019-10-20 DIAGNOSIS — E785 Hyperlipidemia, unspecified: Secondary | ICD-10-CM | POA: Diagnosis not present

## 2019-10-20 DIAGNOSIS — I10 Essential (primary) hypertension: Secondary | ICD-10-CM | POA: Diagnosis not present

## 2019-10-20 DIAGNOSIS — Z1159 Encounter for screening for other viral diseases: Secondary | ICD-10-CM | POA: Diagnosis not present

## 2019-10-21 ENCOUNTER — Other Ambulatory Visit: Payer: Self-pay | Admitting: Family Medicine

## 2019-10-21 DIAGNOSIS — Z1231 Encounter for screening mammogram for malignant neoplasm of breast: Secondary | ICD-10-CM

## 2019-10-26 DIAGNOSIS — R8290 Unspecified abnormal findings in urine: Secondary | ICD-10-CM | POA: Diagnosis not present

## 2019-10-28 ENCOUNTER — Ambulatory Visit
Admission: RE | Admit: 2019-10-28 | Discharge: 2019-10-28 | Disposition: A | Discharge status: Home / Self Care | Payer: PPO | Source: Ambulatory Visit | Attending: Family Medicine | Admitting: Family Medicine

## 2019-10-28 DIAGNOSIS — M7672 Peroneal tendinitis, left leg: Secondary | ICD-10-CM | POA: Diagnosis not present

## 2019-10-28 DIAGNOSIS — Z1231 Encounter for screening mammogram for malignant neoplasm of breast: Secondary | ICD-10-CM | POA: Diagnosis not present

## 2019-10-28 DIAGNOSIS — M19072 Primary osteoarthritis, left ankle and foot: Secondary | ICD-10-CM | POA: Diagnosis not present

## 2019-10-28 DIAGNOSIS — S93325S Dislocation of tarsometatarsal joint of left foot, sequela: Secondary | ICD-10-CM | POA: Diagnosis not present

## 2019-10-28 DIAGNOSIS — M79672 Pain in left foot: Secondary | ICD-10-CM | POA: Diagnosis not present

## 2019-10-28 DIAGNOSIS — E1140 Type 2 diabetes mellitus with diabetic neuropathy, unspecified: Secondary | ICD-10-CM | POA: Diagnosis not present

## 2019-11-04 ENCOUNTER — Other Ambulatory Visit: Payer: Self-pay | Admitting: Family Medicine

## 2019-11-04 DIAGNOSIS — R928 Other abnormal and inconclusive findings on diagnostic imaging of breast: Secondary | ICD-10-CM

## 2019-11-04 DIAGNOSIS — N6320 Unspecified lump in the left breast, unspecified quadrant: Secondary | ICD-10-CM

## 2019-11-04 DIAGNOSIS — I10 Essential (primary) hypertension: Secondary | ICD-10-CM | POA: Diagnosis not present

## 2019-11-04 DIAGNOSIS — E785 Hyperlipidemia, unspecified: Secondary | ICD-10-CM | POA: Diagnosis not present

## 2019-11-04 DIAGNOSIS — Z0000 Encounter for general adult medical examination without abnormal findings: Secondary | ICD-10-CM | POA: Diagnosis not present

## 2019-11-04 DIAGNOSIS — G609 Hereditary and idiopathic neuropathy, unspecified: Secondary | ICD-10-CM | POA: Diagnosis not present

## 2019-11-05 ENCOUNTER — Ambulatory Visit
Admission: RE | Admit: 2019-11-05 | Discharge: 2019-11-05 | Disposition: A | Discharge status: Home / Self Care | Payer: PPO | Source: Ambulatory Visit | Attending: Family Medicine | Admitting: Family Medicine

## 2019-11-05 DIAGNOSIS — N6320 Unspecified lump in the left breast, unspecified quadrant: Secondary | ICD-10-CM

## 2019-11-05 DIAGNOSIS — N6322 Unspecified lump in the left breast, upper inner quadrant: Secondary | ICD-10-CM | POA: Diagnosis not present

## 2019-11-05 DIAGNOSIS — R928 Other abnormal and inconclusive findings on diagnostic imaging of breast: Secondary | ICD-10-CM

## 2019-11-05 DIAGNOSIS — N6324 Unspecified lump in the left breast, lower inner quadrant: Secondary | ICD-10-CM | POA: Diagnosis not present

## 2019-11-09 ENCOUNTER — Other Ambulatory Visit: Payer: Self-pay | Admitting: Family Medicine

## 2019-11-09 DIAGNOSIS — N6320 Unspecified lump in the left breast, unspecified quadrant: Secondary | ICD-10-CM

## 2019-11-09 DIAGNOSIS — R928 Other abnormal and inconclusive findings on diagnostic imaging of breast: Secondary | ICD-10-CM

## 2019-11-13 ENCOUNTER — Ambulatory Visit
Admission: RE | Admit: 2019-11-13 | Discharge: 2019-11-13 | Disposition: A | Discharge status: Home / Self Care | Payer: PPO | Source: Ambulatory Visit | Attending: Family Medicine | Admitting: Family Medicine

## 2019-11-13 DIAGNOSIS — R928 Other abnormal and inconclusive findings on diagnostic imaging of breast: Secondary | ICD-10-CM

## 2019-11-13 DIAGNOSIS — Z7689 Persons encountering health services in other specified circumstances: Secondary | ICD-10-CM | POA: Diagnosis not present

## 2019-11-13 DIAGNOSIS — N6320 Unspecified lump in the left breast, unspecified quadrant: Secondary | ICD-10-CM

## 2019-11-13 DIAGNOSIS — N6324 Unspecified lump in the left breast, lower inner quadrant: Secondary | ICD-10-CM | POA: Diagnosis not present

## 2019-11-13 DIAGNOSIS — N6325 Unspecified lump in the left breast, overlapping quadrants: Secondary | ICD-10-CM | POA: Diagnosis not present

## 2019-11-13 HISTORY — PX: BREAST BIOPSY: SHX20

## 2019-11-16 LAB — SURGICAL PATHOLOGY

## 2019-11-18 ENCOUNTER — Telehealth: Payer: Self-pay

## 2019-11-18 NOTE — Telephone Encounter (Signed)
I spoke with Charlotte Burns she stated she did not want to see a surgeon at this time.  She stated she would wait til after mammogram in November 2021.

## 2019-12-07 DIAGNOSIS — L03317 Cellulitis of buttock: Secondary | ICD-10-CM | POA: Diagnosis not present

## 2019-12-09 ENCOUNTER — Other Ambulatory Visit: Payer: Self-pay | Admitting: Orthopedic Surgery

## 2019-12-09 DIAGNOSIS — M25511 Pain in right shoulder: Secondary | ICD-10-CM

## 2019-12-11 DIAGNOSIS — L0291 Cutaneous abscess, unspecified: Secondary | ICD-10-CM | POA: Diagnosis not present

## 2019-12-24 ENCOUNTER — Other Ambulatory Visit: Payer: Self-pay

## 2019-12-24 ENCOUNTER — Ambulatory Visit
Admission: RE | Admit: 2019-12-24 | Discharge: 2019-12-24 | Disposition: A | Discharge status: Home / Self Care | Payer: PPO | Source: Ambulatory Visit | Attending: Orthopedic Surgery | Admitting: Orthopedic Surgery

## 2019-12-24 DIAGNOSIS — M25511 Pain in right shoulder: Secondary | ICD-10-CM

## 2019-12-29 ENCOUNTER — Other Ambulatory Visit: Payer: Self-pay | Admitting: Orthopedic Surgery

## 2019-12-29 DIAGNOSIS — M75121 Complete rotator cuff tear or rupture of right shoulder, not specified as traumatic: Secondary | ICD-10-CM

## 2020-01-06 ENCOUNTER — Other Ambulatory Visit: Payer: Self-pay

## 2020-01-06 ENCOUNTER — Ambulatory Visit
Admission: RE | Admit: 2020-01-06 | Discharge: 2020-01-06 | Disposition: A | Discharge status: Home / Self Care | Payer: PPO | Source: Ambulatory Visit | Attending: Orthopedic Surgery | Admitting: Orthopedic Surgery

## 2020-01-06 DIAGNOSIS — M75111 Incomplete rotator cuff tear or rupture of right shoulder, not specified as traumatic: Secondary | ICD-10-CM | POA: Diagnosis not present

## 2020-01-06 DIAGNOSIS — M75121 Complete rotator cuff tear or rupture of right shoulder, not specified as traumatic: Secondary | ICD-10-CM

## 2020-01-14 ENCOUNTER — Other Ambulatory Visit: Payer: Self-pay | Admitting: Orthopedic Surgery

## 2020-02-05 ENCOUNTER — Other Ambulatory Visit: Payer: Self-pay

## 2020-02-05 ENCOUNTER — Encounter: Payer: Self-pay | Admitting: Orthopedic Surgery

## 2020-02-05 ENCOUNTER — Encounter
Admission: RE | Admit: 2020-02-05 | Discharge: 2020-02-05 | Disposition: A | Discharge status: Home / Self Care | Payer: PPO | Source: Ambulatory Visit | Attending: Orthopedic Surgery | Admitting: Orthopedic Surgery

## 2020-02-05 DIAGNOSIS — I252 Old myocardial infarction: Secondary | ICD-10-CM | POA: Insufficient documentation

## 2020-02-05 DIAGNOSIS — Z01810 Encounter for preprocedural cardiovascular examination: Secondary | ICD-10-CM | POA: Diagnosis not present

## 2020-02-05 DIAGNOSIS — Z01812 Encounter for preprocedural laboratory examination: Secondary | ICD-10-CM | POA: Diagnosis not present

## 2020-02-05 LAB — URINALYSIS, ROUTINE W REFLEX MICROSCOPIC
Bacteria, UA: NONE SEEN
Bilirubin Urine: NEGATIVE
Glucose, UA: >=500 mg/dL — AB
Hgb urine dipstick: NEGATIVE
Ketones, ur: NEGATIVE mg/dL
Nitrite: NEGATIVE
Protein, ur: 30 mg/dL — AB
Specific Gravity, Urine: 1.028 (ref 1.005–1.030)
pH: 5 (ref 5.0–8.0)

## 2020-02-05 LAB — CBC WITH DIFFERENTIAL/PLATELET
Abs Immature Granulocytes: 0.02 10*3/uL (ref 0.00–0.07)
Basophils Absolute: 0.1 10*3/uL (ref 0.0–0.1)
Basophils Relative: 1 %
Eosinophils Absolute: 0.3 10*3/uL (ref 0.0–0.5)
Eosinophils Relative: 4 %
HCT: 39.9 % (ref 36.0–46.0)
Hemoglobin: 14.2 g/dL (ref 12.0–15.0)
Immature Granulocytes: 0 %
Lymphocytes Relative: 34 %
Lymphs Abs: 2.3 10*3/uL (ref 0.7–4.0)
MCH: 33.9 pg (ref 26.0–34.0)
MCHC: 35.6 g/dL (ref 30.0–36.0)
MCV: 95.2 fL (ref 80.0–100.0)
Monocytes Absolute: 0.5 10*3/uL (ref 0.1–1.0)
Monocytes Relative: 7 %
Neutro Abs: 3.8 10*3/uL (ref 1.7–7.7)
Neutrophils Relative %: 54 %
Platelets: 217 10*3/uL (ref 150–400)
RBC: 4.19 MIL/uL (ref 3.87–5.11)
RDW: 12 % (ref 11.5–15.5)
WBC: 6.9 10*3/uL (ref 4.0–10.5)
nRBC: 0 % (ref 0.0–0.2)

## 2020-02-05 LAB — COMPREHENSIVE METABOLIC PANEL
ALT: 47 U/L — ABNORMAL HIGH (ref 0–44)
AST: 39 U/L (ref 15–41)
Albumin: 4.2 g/dL (ref 3.5–5.0)
Alkaline Phosphatase: 125 U/L (ref 38–126)
Anion gap: 12 (ref 5–15)
BUN: 23 mg/dL (ref 8–23)
CO2: 20 mmol/L — ABNORMAL LOW (ref 22–32)
Calcium: 11.3 mg/dL — ABNORMAL HIGH (ref 8.9–10.3)
Chloride: 103 mmol/L (ref 98–111)
Creatinine, Ser: 0.88 mg/dL (ref 0.44–1.00)
GFR calc Af Amer: >60 mL/min (ref >60)
GFR calc non Af Amer: >60 mL/min (ref >60)
Glucose, Bld: 161 mg/dL — ABNORMAL HIGH (ref 70–99)
Potassium: 3.9 mmol/L (ref 3.5–5.1)
Sodium: 135 mmol/L (ref 135–145)
Total Bilirubin: 0.8 mg/dL (ref 0.3–1.2)
Total Protein: 7.3 g/dL (ref 6.5–8.1)

## 2020-02-05 LAB — TYPE AND SCREEN
ABO/RH(D): O POS
Antibody Screen: NEGATIVE

## 2020-02-05 LAB — SURGICAL PCR SCREEN
MRSA, PCR: NEGATIVE
Staphylococcus aureus: NEGATIVE

## 2020-02-05 NOTE — Patient Instructions (Signed)
Your procedure is scheduled on: Mon. 9/13 Report to Day Surgery. To find out your arrival time please call [phone] between 1PM - 3PM on Friday 9/10.  Remember: Instructions that are not followed completely may result in serious medical risk,  up to and including death, or upon the discretion of your surgeon and anesthesiologist your  surgery may need to be rescheduled.     _X__ 1. Do not eat food after midnight the night before your procedure.                 No chewing gum or hard candies. You may drink clear liquids up to 2 hours                 before you are scheduled to arrive for your surgery- DO not drink clear                 liquids within 2 hours of the start of your surgery.                 Clear Liquids include:  water, apple juice without pulp, clear Gatorade, G2 or                  Gatorade Zero (avoid Red/Purple/Blue), Black Coffee or Tea (Do not add                 anything to coffee or tea). __x___2.   Complete the "Ensure Clear Pre-surgery Clear Carbohydrate Drink" provided to you, 2 hours before arrival. **If you       are diabetic you will be provided with an alternative drink, Gatorade Zero or G2.  __X__2.  On the morning of surgery brush your teeth with toothpaste and water, you                may rinse your mouth with mouthwash if you wish.  Do not swallow any toothpaste of mouthwash.     _X__ 3.  No Alcohol for 24 hours before or after surgery.   _X__ 4.  Do Not Smoke or use e-cigarettes For 24 Hours Prior to Your Surgery.                 Do not use any chewable tobacco products for at least 6 hours prior to                 Surgery.  _X__  5.  Do not use any recreational drugs (marijuana, cocaine, heroin, ecstasy, MDMA or other)                For at least one week prior to your surgery.  Combination of these drugs with anesthesia                May have life threatening results.  ____  6.  Bring all medications with you on the day of surgery if  instructed.  x ____  7.  Notify your doctor if there is any change in your medical condition      (cold, fever, infections).     Do not wear jewelry, make-up, hairpins, clips or nail polish. Do not wear lotions, powders, or perfumes.. Do not shave 48 hours prior to surgery.  Do not bring valuables to the hospital.    [facility] is not responsible for any belongings or valuables.  Contacts, dentures or bridgework may not be worn into surgery. Leave your suitcase in the car. After surgery it may be  brought to your room. For patients admitted to the hospital, discharge time is determined by your treatment team.   Patients discharged the day of surgery will not be allowed to drive home.   Make arrangements for someone to be with you for the first 24 hours of your Same Day Discharge.    Please read over the following fact sheets that you were given:    _x___ Take these medicines the morning of surgery with A SIP OF WATER:    1. acetaminophen (TYLENOL) 500 MG tablet or traMADol (ULTRAM) 50 MG tablet if needed  2. omeprazole (PRILOSEC) 20 MG capsule night before and morning of the surgery  3.   4.  5.  6.  ____ Fleet Enema (as directed)   __x__ Use CHG Soap (or wipes) as directed  __x__ Use Benzoyl Peroxide Gel as instructed  ____ Use inhalers on the day of surgery  __x__ Stop metformin 2 days prior to surgery  Last dose on 9/10    ____ Take 1/2 of usual insulin dose the night before surgery. No insulin the morning          of surgery.   __x__ Stop aspirin on 9/6  __x__ Stop Anti-inflammatories diclofenac (VOLTAREN) 75 MG EC tablet, ibuprofen or aleve on 9/6    May use Voltaren Gel   ____ Stop supplements until after surgery.    ____ Bring C-Pap to the hospital.    If you have any questions regarding your pre-procedure instructions,  Please call Pre-admit Testing at [facility] your living will to the hospital

## 2020-02-09 NOTE — Progress Notes (Signed)
  [facility] Perioperative Services: Pre-Admission/Anesthesia Testing  Abnormal Lab Notification    Date: 02/09/20  Name: Charlotte Burns MRN:   [id number]  Re: Abnormal labs noted during PAT appointment   Provider(s) Notified: No att. providers found Notification mode: Routed and/or faxed via [facility]   ABNORMAL LAB VALUE(S): Lab Results  Component Value Date   COLORURINE YELLOW (A) 02/05/2020   APPEARANCEUR CLOUDY (A) 02/05/2020   LABSPEC 1.028 02/05/2020   PHURINE 5.0 02/05/2020   GLUCOSEU >=500 (A) 02/05/2020   HGBUR NEGATIVE 02/05/2020   BILIRUBINUR NEGATIVE 02/05/2020   KETONESUR NEGATIVE 02/05/2020   PROTEINUR 30 (A) 02/05/2020   UROBILINOGEN 0.2 01/19/2014   NITRITE NEGATIVE 02/05/2020   LEUKOCYTESUR LARGE (A) 02/05/2020   EPIU 6-10 02/05/2020   WBCU 21-50 02/05/2020   RBCU 21-50 02/05/2020   BACTERIA NONE SEEN 02/05/2020   Lab Results  Component Value Date   CALCIUM 11.3 (H) 02/05/2020    Notes:  Scheduled for a reverse shoulder arthroplasty on 02/15/2020. This is a Community education officer; no formal response is required.  Honor Loh, MSN, APRN, FNP-C, CEN [facility]  Peri-operative Services Nurse Practitioner Phone: [phone] 02/09/20 8:29 AM

## 2020-02-11 ENCOUNTER — Other Ambulatory Visit: Payer: Self-pay

## 2020-02-11 ENCOUNTER — Other Ambulatory Visit
Admission: RE | Admit: 2020-02-11 | Discharge: 2020-02-11 | Disposition: A | Discharge status: Home / Self Care | Payer: PPO | Source: Ambulatory Visit | Attending: Orthopedic Surgery | Admitting: Orthopedic Surgery

## 2020-02-11 DIAGNOSIS — Z20822 Contact with and (suspected) exposure to covid-19: Secondary | ICD-10-CM | POA: Diagnosis not present

## 2020-02-11 DIAGNOSIS — Z01812 Encounter for preprocedural laboratory examination: Secondary | ICD-10-CM | POA: Insufficient documentation

## 2020-02-11 DIAGNOSIS — M75121 Complete rotator cuff tear or rupture of right shoulder, not specified as traumatic: Secondary | ICD-10-CM | POA: Diagnosis not present

## 2020-02-12 LAB — SARS CORONAVIRUS 2 (TAT 6-24 HRS): SARS Coronavirus 2: NEGATIVE

## 2020-02-15 ENCOUNTER — Other Ambulatory Visit: Payer: Self-pay

## 2020-02-15 ENCOUNTER — Ambulatory Visit: Payer: PPO | Admitting: Anesthesiology

## 2020-02-15 ENCOUNTER — Observation Stay
Admission: RE | Admit: 2020-02-15 | Discharge: 2020-02-16 | Disposition: A | Discharge status: Home / Self Care | Payer: PPO | Attending: Orthopedic Surgery | Admitting: Orthopedic Surgery

## 2020-02-15 ENCOUNTER — Ambulatory Visit: Payer: PPO

## 2020-02-15 ENCOUNTER — Encounter
Admission: RE | Disposition: A | Discharge status: Home / Self Care | Payer: Self-pay | Source: Home / Self Care | Attending: Orthopedic Surgery

## 2020-02-15 ENCOUNTER — Encounter: Payer: Self-pay | Admitting: Orthopedic Surgery

## 2020-02-15 DIAGNOSIS — Z79899 Other long term (current) drug therapy: Secondary | ICD-10-CM | POA: Insufficient documentation

## 2020-02-15 DIAGNOSIS — Z96611 Presence of right artificial shoulder joint: Secondary | ICD-10-CM

## 2020-02-15 DIAGNOSIS — M25511 Pain in right shoulder: Secondary | ICD-10-CM | POA: Diagnosis not present

## 2020-02-15 DIAGNOSIS — M129 Arthropathy, unspecified: Principal | ICD-10-CM | POA: Insufficient documentation

## 2020-02-15 DIAGNOSIS — M75121 Complete rotator cuff tear or rupture of right shoulder, not specified as traumatic: Secondary | ICD-10-CM | POA: Diagnosis not present

## 2020-02-15 DIAGNOSIS — E119 Type 2 diabetes mellitus without complications: Secondary | ICD-10-CM | POA: Diagnosis not present

## 2020-02-15 DIAGNOSIS — Z471 Aftercare following joint replacement surgery: Secondary | ICD-10-CM | POA: Diagnosis not present

## 2020-02-15 DIAGNOSIS — M7521 Bicipital tendinitis, right shoulder: Secondary | ICD-10-CM | POA: Diagnosis not present

## 2020-02-15 DIAGNOSIS — Z96619 Presence of unspecified artificial shoulder joint: Secondary | ICD-10-CM

## 2020-02-15 DIAGNOSIS — Z419 Encounter for procedure for purposes other than remedying health state, unspecified: Secondary | ICD-10-CM

## 2020-02-15 DIAGNOSIS — G8918 Other acute postprocedural pain: Secondary | ICD-10-CM | POA: Diagnosis not present

## 2020-02-15 DIAGNOSIS — K219 Gastro-esophageal reflux disease without esophagitis: Secondary | ICD-10-CM | POA: Diagnosis not present

## 2020-02-15 DIAGNOSIS — E7800 Pure hypercholesterolemia, unspecified: Secondary | ICD-10-CM | POA: Diagnosis not present

## 2020-02-15 HISTORY — PX: REVERSE SHOULDER ARTHROPLASTY: SHX5054

## 2020-02-15 HISTORY — DX: Type 2 diabetes mellitus without complications: E11.9

## 2020-02-15 LAB — GLUCOSE, CAPILLARY
Glucose-Capillary: 191 mg/dL — ABNORMAL HIGH (ref 70–99)
Glucose-Capillary: 207 mg/dL — ABNORMAL HIGH (ref 70–99)

## 2020-02-15 SURGERY — ARTHROPLASTY, SHOULDER, TOTAL, REVERSE
Anesthesia: Regional | Site: Shoulder | Laterality: Right

## 2020-02-15 MED ORDER — OXYCODONE HCL 5 MG PO TABS: 5.0000 mg | ORAL_TABLET | ORAL | Status: DC | PRN

## 2020-02-15 MED ORDER — PHENYLEPHRINE HCL (PRESSORS) 10 MG/ML IV SOLN
INTRAVENOUS | Status: AC
  Filled 2020-02-15: qty 1

## 2020-02-15 MED ORDER — SODIUM CHLORIDE 0.9 % IV SOLN: INTRAVENOUS | Status: DC

## 2020-02-15 MED ORDER — EPHEDRINE 5 MG/ML INJ
INTRAVENOUS | Status: AC
  Filled 2020-02-15: qty 10

## 2020-02-15 MED ORDER — ONDANSETRON HCL 4 MG/2ML IJ SOLN: 4.0000 mg | Freq: Once | INTRAMUSCULAR | Status: AC | PRN

## 2020-02-15 MED ORDER — CEFAZOLIN SODIUM-DEXTROSE 2-4 GM/100ML-% IV SOLN: 2.0000 g | Freq: Once | INTRAVENOUS | Status: AC

## 2020-02-15 MED ORDER — METOCLOPRAMIDE HCL 5 MG/ML IJ SOLN: 5.0000 mg | Freq: Three times a day (TID) | INTRAMUSCULAR | Status: DC | PRN

## 2020-02-15 MED ORDER — ACETAMINOPHEN 500 MG PO TABS: 1000.0000 mg | ORAL_TABLET | Freq: Three times a day (TID) | ORAL | 2 refills | Status: AC

## 2020-02-15 MED ORDER — VANCOMYCIN HCL 1000 MG IV SOLR
INTRAVENOUS | Status: DC | PRN
  Administered 2020-02-15: 1000 mg via INTRAVENOUS

## 2020-02-15 MED ORDER — EPHEDRINE SULFATE 50 MG/ML IJ SOLN
INTRAMUSCULAR | Status: DC | PRN
  Administered 2020-02-15 (×2): 10 mg via INTRAVENOUS

## 2020-02-15 MED ORDER — ORAL CARE MOUTH RINSE: 15.0000 mL | Freq: Once | OROMUCOSAL | Status: AC

## 2020-02-15 MED ORDER — OXYCODONE HCL 5 MG PO TABS
5.0000 mg | ORAL_TABLET | Freq: Once | ORAL | Status: AC
  Administered 2020-02-15: 5 mg via ORAL
  Filled 2020-02-15: qty 1

## 2020-02-15 MED ORDER — VANCOMYCIN HCL 1000 MG IV SOLR
INTRAVENOUS | Status: DC | PRN
  Administered 2020-02-15: 1000 mg via TOPICAL

## 2020-02-15 MED ORDER — PROMETHAZINE HCL 25 MG/ML IJ SOLN: 6.2500 mg | INTRAMUSCULAR | Status: DC | PRN

## 2020-02-15 MED ORDER — ROCURONIUM BROMIDE 10 MG/ML (PF) SYRINGE
PREFILLED_SYRINGE | INTRAVENOUS | Status: AC
  Filled 2020-02-15: qty 10

## 2020-02-15 MED ORDER — MENTHOL 3 MG MT LOZG
1.0000 | LOZENGE | OROMUCOSAL | Status: DC | PRN
  Filled 2020-02-15: qty 9

## 2020-02-15 MED ORDER — NEOMYCIN-POLYMYXIN B GU 40-200000 IR SOLN
Status: AC
  Filled 2020-02-15: qty 20

## 2020-02-15 MED ORDER — SODIUM CHLORIDE 0.9 % IV SOLN
INTRAVENOUS | Status: DC | PRN
  Administered 2020-02-15: 30 ug/min via INTRAVENOUS

## 2020-02-15 MED ORDER — FENTANYL CITRATE (PF) 100 MCG/2ML IJ SOLN: 25.0000 ug | INTRAMUSCULAR | Status: DC | PRN

## 2020-02-15 MED ORDER — MIDAZOLAM HCL 2 MG/2ML IJ SOLN
INTRAMUSCULAR | Status: AC
  Administered 2020-02-15: 1 mg
  Filled 2020-02-15: qty 2

## 2020-02-15 MED ORDER — PROPOFOL 10 MG/ML IV BOLUS
INTRAVENOUS | Status: DC | PRN
  Administered 2020-02-15: 120 mg via INTRAVENOUS

## 2020-02-15 MED ORDER — PANTOPRAZOLE SODIUM 40 MG PO TBEC: 40.0000 mg | DELAYED_RELEASE_TABLET | Freq: Every day | ORAL | Status: DC

## 2020-02-15 MED ORDER — ROCURONIUM BROMIDE 100 MG/10ML IV SOLN
INTRAVENOUS | Status: DC | PRN
  Administered 2020-02-15: 40 mg via INTRAVENOUS
  Administered 2020-02-15: 10 mg via INTRAVENOUS
  Administered 2020-02-15: 40 mg via INTRAVENOUS
  Administered 2020-02-15: 10 mg via INTRAVENOUS

## 2020-02-15 MED ORDER — BUPIVACAINE HCL (PF) 0.5 % IJ SOLN
INTRAMUSCULAR | Status: AC
  Filled 2020-02-15: qty 10

## 2020-02-15 MED ORDER — PROMETHAZINE HCL 25 MG/ML IJ SOLN
INTRAMUSCULAR | Status: AC
  Administered 2020-02-15: 6.25 mg via INTRAVENOUS
  Filled 2020-02-15: qty 1

## 2020-02-15 MED ORDER — CEFAZOLIN SODIUM-DEXTROSE 2-4 GM/100ML-% IV SOLN
INTRAVENOUS | Status: AC
  Filled 2020-02-15: qty 100

## 2020-02-15 MED ORDER — PROPOFOL 10 MG/ML IV BOLUS
INTRAVENOUS | Status: AC
  Filled 2020-02-15: qty 20

## 2020-02-15 MED ORDER — ONDANSETRON HCL 4 MG/2ML IJ SOLN
INTRAMUSCULAR | Status: AC
  Filled 2020-02-15: qty 2

## 2020-02-15 MED ORDER — ONDANSETRON 4 MG PO TBDP: 4.0000 mg | ORAL_TABLET | Freq: Three times a day (TID) | ORAL | 0 refills | Status: DC | PRN

## 2020-02-15 MED ORDER — PHENYLEPHRINE HCL (PRESSORS) 10 MG/ML IV SOLN
INTRAVENOUS | Status: DC | PRN
  Administered 2020-02-15 (×7): 100 ug via INTRAVENOUS

## 2020-02-15 MED ORDER — BUPIVACAINE HCL (PF) 0.5 % IJ SOLN
INTRAMUSCULAR | Status: DC | PRN
  Administered 2020-02-15: 10 mL via PERINEURAL

## 2020-02-15 MED ORDER — FENTANYL CITRATE (PF) 100 MCG/2ML IJ SOLN
INTRAMUSCULAR | Status: AC
  Filled 2020-02-15: qty 2

## 2020-02-15 MED ORDER — BUPIVACAINE LIPOSOME 1.3 % IJ SUSP
INTRAMUSCULAR | Status: DC | PRN
  Administered 2020-02-15: 20 mL via PERINEURAL

## 2020-02-15 MED ORDER — PHENOL 1.4 % MT LIQD
1.0000 | OROMUCOSAL | Status: DC | PRN
  Filled 2020-02-15: qty 177

## 2020-02-15 MED ORDER — ACETAMINOPHEN 500 MG PO TABS
1000.0000 mg | ORAL_TABLET | Freq: Three times a day (TID) | ORAL | Status: DC
  Administered 2020-02-15 – 2020-02-16 (×3): 1000 mg via ORAL
  Filled 2020-02-15 (×3): qty 2

## 2020-02-15 MED ORDER — SODIUM CHLORIDE FLUSH 0.9 % IV SOLN
INTRAVENOUS | Status: AC
  Filled 2020-02-15: qty 10

## 2020-02-15 MED ORDER — OXYCODONE HCL 5 MG PO TABS
ORAL_TABLET | ORAL | Status: AC
  Filled 2020-02-15: qty 1

## 2020-02-15 MED ORDER — LIDOCAINE HCL (PF) 1 % IJ SOLN
INTRAMUSCULAR | Status: AC
  Filled 2020-02-15: qty 5

## 2020-02-15 MED ORDER — VANCOMYCIN HCL 1000 MG IV SOLR
INTRAVENOUS | Status: AC
  Filled 2020-02-15: qty 1000

## 2020-02-15 MED ORDER — ASPIRIN EC 325 MG PO TBEC
325.0000 mg | DELAYED_RELEASE_TABLET | Freq: Every day | ORAL | Status: DC
  Administered 2020-02-16: 325 mg via ORAL
  Filled 2020-02-15 (×2): qty 1

## 2020-02-15 MED ORDER — LIDOCAINE HCL (PF) 1 % IJ SOLN
INTRAMUSCULAR | Status: DC | PRN
  Administered 2020-02-15: 3 mL

## 2020-02-15 MED ORDER — LIDOCAINE HCL (CARDIAC) PF 100 MG/5ML IV SOSY
PREFILLED_SYRINGE | INTRAVENOUS | Status: DC | PRN
  Administered 2020-02-15: 80 mg via INTRAVENOUS

## 2020-02-15 MED ORDER — CHLORHEXIDINE GLUCONATE 0.12 % MT SOLN
OROMUCOSAL | Status: AC
  Administered 2020-02-15: 15 mL via OROMUCOSAL
  Filled 2020-02-15: qty 15

## 2020-02-15 MED ORDER — FENTANYL CITRATE (PF) 100 MCG/2ML IJ SOLN
INTRAMUSCULAR | Status: DC | PRN
Start: 2020-02-15 — End: 2020-02-15
  Administered 2020-02-15 (×2): 50 ug via INTRAVENOUS

## 2020-02-15 MED ORDER — ONDANSETRON HCL 4 MG/2ML IJ SOLN
INTRAMUSCULAR | Status: AC
  Administered 2020-02-15: 4 mg
  Filled 2020-02-15: qty 2

## 2020-02-15 MED ORDER — ASPIRIN EC 325 MG PO TBEC: 325.0000 mg | DELAYED_RELEASE_TABLET | Freq: Every day | ORAL | 0 refills | Status: AC

## 2020-02-15 MED ORDER — NEOMYCIN-POLYMYXIN B GU 40-200000 IR SOLN
Status: DC | PRN
  Administered 2020-02-15: 16 mL

## 2020-02-15 MED ORDER — TRANEXAMIC ACID-NACL 1000-0.7 MG/100ML-% IV SOLN
INTRAVENOUS | Status: AC
  Filled 2020-02-15: qty 100

## 2020-02-15 MED ORDER — CEFAZOLIN SODIUM-DEXTROSE 2-4 GM/100ML-% IV SOLN
2.0000 g | Freq: Four times a day (QID) | INTRAVENOUS | Status: AC
  Administered 2020-02-15 – 2020-02-16 (×3): 2 g via INTRAVENOUS
  Filled 2020-02-15 (×4): qty 100

## 2020-02-15 MED ORDER — ONDANSETRON HCL 4 MG PO TABS: 4.0000 mg | ORAL_TABLET | Freq: Four times a day (QID) | ORAL | Status: DC | PRN

## 2020-02-15 MED ORDER — SENNOSIDES-DOCUSATE SODIUM 8.6-50 MG PO TABS: 1.0000 | ORAL_TABLET | Freq: Every evening | ORAL | Status: DC | PRN

## 2020-02-15 MED ORDER — TRANEXAMIC ACID-NACL 1000-0.7 MG/100ML-% IV SOLN
1000.0000 mg | INTRAVENOUS | Status: AC
  Administered 2020-02-15: 1000 mg via INTRAVENOUS

## 2020-02-15 MED ORDER — LACTATED RINGERS IV SOLN: INTRAVENOUS | Status: DC | PRN

## 2020-02-15 MED ORDER — ONDANSETRON HCL 4 MG/2ML IJ SOLN
INTRAMUSCULAR | Status: DC | PRN
  Administered 2020-02-15: 4 mg via INTRAVENOUS

## 2020-02-15 MED ORDER — LIDOCAINE HCL (PF) 2 % IJ SOLN
INTRAMUSCULAR | Status: AC
  Filled 2020-02-15: qty 5

## 2020-02-15 MED ORDER — BISACODYL 10 MG RE SUPP: 10.0000 mg | Freq: Every day | RECTAL | Status: DC | PRN

## 2020-02-15 MED ORDER — METOCLOPRAMIDE HCL 10 MG PO TABS: 5.0000 mg | ORAL_TABLET | Freq: Three times a day (TID) | ORAL | Status: DC | PRN

## 2020-02-15 MED ORDER — ONDANSETRON HCL 4 MG/2ML IJ SOLN: 4.0000 mg | Freq: Four times a day (QID) | INTRAMUSCULAR | Status: DC | PRN

## 2020-02-15 MED ORDER — DOCUSATE SODIUM 100 MG PO CAPS
100.0000 mg | ORAL_CAPSULE | Freq: Two times a day (BID) | ORAL | Status: DC
  Administered 2020-02-15: 100 mg via ORAL
  Filled 2020-02-15: qty 1

## 2020-02-15 MED ORDER — PREDNISOLONE ACETATE 1 % OP SUSP
1.0000 [drp] | OPHTHALMIC | Status: DC
  Filled 2020-02-15: qty 1

## 2020-02-15 MED ORDER — OXYCODONE HCL 5 MG PO TABS: 5.0000 mg | ORAL_TABLET | ORAL | 0 refills | Status: AC | PRN

## 2020-02-15 MED ORDER — CEFAZOLIN SODIUM-DEXTROSE 2-4 GM/100ML-% IV SOLN
2.0000 g | INTRAVENOUS | Status: AC
  Administered 2020-02-15: 2 g via INTRAVENOUS

## 2020-02-15 MED ORDER — CHLORHEXIDINE GLUCONATE 0.12 % MT SOLN: 15.0000 mL | Freq: Once | OROMUCOSAL | Status: AC

## 2020-02-15 MED ORDER — GABAPENTIN 300 MG PO CAPS
600.0000 mg | ORAL_CAPSULE | Freq: Every day | ORAL | Status: DC
  Administered 2020-02-15: 600 mg via ORAL
  Filled 2020-02-15: qty 2

## 2020-02-15 MED ORDER — FENTANYL CITRATE (PF) 100 MCG/2ML IJ SOLN
INTRAMUSCULAR | Status: AC
  Administered 2020-02-15: 50 ug
  Filled 2020-02-15: qty 2

## 2020-02-15 MED ORDER — ALUM & MAG HYDROXIDE-SIMETH 200-200-20 MG/5ML PO SUSP: 30.0000 mL | ORAL | Status: DC | PRN

## 2020-02-15 MED ORDER — BUPIVACAINE LIPOSOME 1.3 % IJ SUSP
INTRAMUSCULAR | Status: AC
  Filled 2020-02-15: qty 20

## 2020-02-15 MED ORDER — SUGAMMADEX SODIUM 200 MG/2ML IV SOLN
INTRAVENOUS | Status: DC | PRN
  Administered 2020-02-15: 200 mg via INTRAVENOUS

## 2020-02-15 MED ORDER — VANCOMYCIN HCL IN DEXTROSE 1-5 GM/200ML-% IV SOLN
INTRAVENOUS | Status: AC
  Filled 2020-02-15: qty 200

## 2020-02-15 MED ORDER — CEFAZOLIN SODIUM-DEXTROSE 2-4 GM/100ML-% IV SOLN
INTRAVENOUS | Status: AC
  Administered 2020-02-15: 2 g via INTRAVENOUS
  Filled 2020-02-15: qty 100

## 2020-02-15 MED ORDER — SODIUM CHLORIDE (PF) 0.9 % IJ SOLN
INTRAMUSCULAR | Status: AC
  Filled 2020-02-15: qty 10

## 2020-02-15 SURGICAL SUPPLY — 83 items
BASEPLATE P2 COATD GLND 6.5X30 (Shoulder) ×1 IMPLANT
BIT DRILL 4 DIA CALIBRATED (BIT) ×3 IMPLANT
BIT DRILL GUIDE PATIENT MATCH (MISCELLANEOUS) ×1 IMPLANT
BLADE SAGITTAL WIDE XTHICK NO (BLADE) ×3 IMPLANT
CANISTER SUCT 1200ML W/VALVE (MISCELLANEOUS) ×3 IMPLANT
CANISTER SUCT 3000ML PPV (MISCELLANEOUS) ×6 IMPLANT
CHLORAPREP W/TINT 26 (MISCELLANEOUS) ×3 IMPLANT
CLOSURE WOUND 1/2 X4 (GAUZE/BANDAGES/DRESSINGS) ×1
CNTNR SPEC 2.5X3XGRAD LEK (MISCELLANEOUS) ×1
CONT SPEC 4OZ STER OR WHT (MISCELLANEOUS) ×3
CONTAINER SPEC 2.5X3XGRAD LEK (MISCELLANEOUS) ×1 IMPLANT
COOLER POLAR GLACIER W/PUMP (MISCELLANEOUS) ×3 IMPLANT
COVER BACK TABLE REUSABLE LG (DRAPES) ×3 IMPLANT
COVER WAND RF STERILE (DRAPES) ×3 IMPLANT
DERMABOND ADVANCED (GAUZE/BANDAGES/DRESSINGS) ×2
DERMABOND ADVANCED .7 DNX12 (GAUZE/BANDAGES/DRESSINGS) ×1 IMPLANT
DRAPE 3/4 80X56 (DRAPES) ×6 IMPLANT
DRAPE IMP U-DRAPE 54X76 (DRAPES) ×6 IMPLANT
DRAPE INCISE IOBAN 66X45 STRL (DRAPES) ×6 IMPLANT
DRAPE U-SHAPE 47X51 STRL (DRAPES) ×3 IMPLANT
DRILL GUIDE PATIENT MATCH (MISCELLANEOUS) ×3
DRSG MEPILEX SACRM 8.7X9.8 (GAUZE/BANDAGES/DRESSINGS) ×3 IMPLANT
DRSG OPSITE POSTOP 3X4 (GAUZE/BANDAGES/DRESSINGS) IMPLANT
DRSG OPSITE POSTOP 4X6 (GAUZE/BANDAGES/DRESSINGS) IMPLANT
DRSG OPSITE POSTOP 4X8 (GAUZE/BANDAGES/DRESSINGS) ×3 IMPLANT
DRSG TEGADERM 2-3/8X2-3/4 SM (GAUZE/BANDAGES/DRESSINGS) ×3 IMPLANT
DRSG TEGADERM 4X10 (GAUZE/BANDAGES/DRESSINGS) IMPLANT
ELECT REM PT RETURN 9FT ADLT (ELECTROSURGICAL) ×3
ELECTRODE REM PT RTRN 9FT ADLT (ELECTROSURGICAL) ×1 IMPLANT
GAUZE 4X4 16PLY RFD (DISPOSABLE) ×3 IMPLANT
GAUZE XEROFORM 1X8 LF (GAUZE/BANDAGES/DRESSINGS) ×3 IMPLANT
GLOVE BIOGEL PI IND STRL 8 (GLOVE) ×2 IMPLANT
GLOVE BIOGEL PI INDICATOR 8 (GLOVE) ×4
GLOVE SURG ORTHO 8.0 STRL STRW (GLOVE) ×6 IMPLANT
GLOVE SURG SYN 8.0 (GLOVE) ×3 IMPLANT
GOWN STRL REUS W/ TWL LRG LVL3 (GOWN DISPOSABLE) ×2 IMPLANT
GOWN STRL REUS W/ TWL XL LVL3 (GOWN DISPOSABLE) ×1 IMPLANT
GOWN STRL REUS W/TWL LRG LVL3 (GOWN DISPOSABLE) ×6
GOWN STRL REUS W/TWL XL LVL3 (GOWN DISPOSABLE) ×3
HEMOVAC 400CC 10FR (MISCELLANEOUS) ×3 IMPLANT
HOOD PEEL AWAY FLYTE STAYCOOL (MISCELLANEOUS) ×9 IMPLANT
INSERT SMALL SOCKET 32MM NEU (Insert) ×3 IMPLANT
KIT STABILIZATION SHOULDER (MISCELLANEOUS) ×3 IMPLANT
MASK FACE SPIDER DISP (MASK) ×3 IMPLANT
MAT ABSORB  FLUID 56X50 GRAY (MISCELLANEOUS) ×6
MAT ABSORB FLUID 56X50 GRAY (MISCELLANEOUS) ×2 IMPLANT
NEEDLE REVERSE CUT 1/2 CRC (NEEDLE) ×3 IMPLANT
NEEDLE SPNL 20GX3.5 QUINCKE YW (NEEDLE) ×3 IMPLANT
NS IRRIG 1000ML POUR BTL (IV SOLUTION) ×3 IMPLANT
P2 COATDE GLNOID BSEPLT 6.5X30 (Shoulder) ×3 IMPLANT
PACK SHDR ARTHRO (MISCELLANEOUS) ×3 IMPLANT
PAD WRAPON POLAR SHDR XLG (MISCELLANEOUS) ×1 IMPLANT
PASSER SUT SWANSON 36MM LOOP (INSTRUMENTS) IMPLANT
PENCIL SMOKE ULTRAEVAC 22 CON (MISCELLANEOUS) ×3 IMPLANT
PULSAVAC PLUS IRRIG FAN TIP (DISPOSABLE) ×3
RETRIEVER SUT HEWSON (MISCELLANEOUS) IMPLANT
SCREW BONE RSP LOCK 5X18 (Screw) ×3 IMPLANT
SCREW BONE RSP LOCKING 18MM LG (Screw) ×9 IMPLANT
SCREW RETAIN W/HEAD 4MM OFFSET (Shoulder) ×3 IMPLANT
SLING ULTRA II LG (MISCELLANEOUS) ×3 IMPLANT
SLING ULTRA II M (MISCELLANEOUS) IMPLANT
SPONGE GAUZE 2X2 8PLY STER LF (GAUZE/BANDAGES/DRESSINGS) ×1
SPONGE GAUZE 2X2 8PLY STRL LF (GAUZE/BANDAGES/DRESSINGS) ×2 IMPLANT
SPONGE LAP 18X18 RF (DISPOSABLE) ×12 IMPLANT
STAPLER SKIN PROX 35W (STAPLE) IMPLANT
STEM HUMERAL SM SHELL 12X48 (Stem) ×1 IMPLANT
STEM HUMERAL SMALL SHELL 12X48 (Stem) ×3 IMPLANT
STRAP SAFETY 5IN WIDE (MISCELLANEOUS) ×6 IMPLANT
STRIP CLOSURE SKIN 1/2X4 (GAUZE/BANDAGES/DRESSINGS) ×2 IMPLANT
SUT BONE WAX W31G (SUTURE) ×3 IMPLANT
SUT FIBERWIRE #2 38 BLUE 1/2 (SUTURE) ×18
SUT MNCRL AB 4-0 PS2 18 (SUTURE) IMPLANT
SUT PROLENE 6 0 P 1 18 (SUTURE) ×3 IMPLANT
SUT TICRON 2-0 30IN 311381 (SUTURE) ×9 IMPLANT
SUT VIC AB 0 CT1 36 (SUTURE) ×3 IMPLANT
SUT VIC AB 2-0 CT2 27 (SUTURE) ×9 IMPLANT
SUTURE FIBERWR #2 38 BLUE 1/2 (SUTURE) ×6 IMPLANT
SYR 20ML LL LF (SYRINGE) ×3 IMPLANT
SYR 30ML LL (SYRINGE) ×6 IMPLANT
SYR BULB IRRIG 60ML STRL (SYRINGE) ×3 IMPLANT
TIP FAN IRRIG PULSAVAC PLUS (DISPOSABLE) ×1 IMPLANT
TRAY FOLEY SLVR 16FR LF STAT (SET/KITS/TRAYS/PACK) IMPLANT
WRAPON POLAR PAD SHDR XLG (MISCELLANEOUS) ×3

## 2020-02-15 NOTE — Evaluation (Signed)
Occupational Therapy Evaluation Patient Details Name: Charlotte Burns MRN: [id number] DOB: Jul 19, 1945 Today's Date: 02/15/2020    History of Present Illness 74yo female pt POD0 s/p reverse R TSA.   Clinical Impression   Patient was seen for an OT evaluation this date and partial co-tx at end of session with PT. Pt lives with her spouse and 2 adult sons who can provide intermittent assist at home. Son present for evaluation. Prior to surgery, pt was active and independent. Pt has orders for RUE to be immobilized and will be NWBing per MD. Patient presents with impaired strength/ROM, pain, and sensation to RUE. These impairments result in a decreased ability to perform self care tasks requiring min-mod assist for UB/LB dressing and bathing and max assist for application of polar care, compression stockings, and sling/immobilizer. Pt/son instructed in polar care mgt, compression stockings mgt, sling/immobilizer mgt, ROM exercises for RUE (with instructions for no shoulder exercises until full sensation has returned), RUE precautions, adaptive strategies for bathing/dressing/toileting/grooming, positioning and considerations for sleep, and home/routines modifications to maximize falls prevention, safety, and independence. Handout provided. OT adjusted sling/immobilizer and polar care to improve comfort, optimize positioning, and to maximize skin integrity/safety. Pt verbalized understanding of all education/training provided. Pt required CGA for ADL transfers and mobility. Pt required Min A for pericare after toileting and for clothing mgt. Pt endorsed dizziness and 2/10 headache. Vitals taken, RN notified (please see below for additional detail.) Pt will benefit from skilled OT services to address these limitations and improve independence in daily tasks. Recommend follow up per surgeon for therapy services to continue therapy to maximize return to PLOF, address home/routines modifications and safety,  minimize falls risk, and minimize caregiver burden.      Follow Up Recommendations  Follow surgeon's recommendation for DC plan and follow-up therapies;Supervision - Intermittent    Equipment Recommendations  None recommended by OT    Recommendations for Other Services       Precautions / Restrictions Precautions Precautions: Fall;Shoulder Shoulder Interventions: Shoulder sling/immobilizer;At all times;Off for dressing/bathing/exercises Precaution Booklet Issued: Yes (comment) Restrictions Weight Bearing Restrictions: Yes RUE Weight Bearing: Non weight bearing      Mobility Bed Mobility               General bed mobility comments: pt up in recliner at start and end of session  Transfers Overall transfer level: Needs assistance Equipment used: None Transfers: Sit to/from Stand Sit to Stand: Min guard              Balance Overall balance assessment: Needs assistance Sitting-balance support: Feet supported;No upper extremity supported Sitting balance-Leahy Scale: Good     Standing balance support: No upper extremity supported;During functional activity Standing balance-Leahy Scale: Fair                             ADL either performed or assessed with clinical judgement   ADL Overall ADL's : Needs assistance/impaired                                       General ADL Comments: Min A for LB ADL, Min-mod A for UB ADL tasks, CGA for ADL transfers; Min A for pericare thoroughness due to pt needing to use non-dom LUE for hygiene; pt/son report family able to provide needed level of assist     Vision Patient Visual  Report: No change from baseline       Perception     Praxis      Pertinent Vitals/Pain Pain Assessment: 0-10 Pain Score: 2  Pain Location: headache after ambulating to bathroom and toileting Pain Descriptors / Indicators: Headache Pain Intervention(s): Limited activity within patient's tolerance;Monitored during  session;Repositioned     Hand Dominance Right   Extremity/Trunk Assessment Upper Extremity Assessment Upper Extremity Assessment: RUE deficits/detail RUE Deficits / Details: s/p rev R TSA RUE: Unable to fully assess due to immobilization RUE Sensation: decreased light touch RUE Coordination: decreased gross motor;decreased fine motor   Lower Extremity Assessment Lower Extremity Assessment: Defer to PT evaluation       Communication Communication Communication: No difficulties   Cognition Arousal/Alertness: Suspect due to medications Behavior During Therapy: WFL for tasks assessed/performed Overall Cognitive Status: Within Functional Limits for tasks assessed                                 General Comments: initially lethargic requiring verbal and tactile cues to remain alert, still falling asleep when sitting in chair, does better once up and moving around   General Comments  after bathroom, pt endorses feeling lightheaded. Taken back from bathroom and BP taken: 181/86, 2/10 headache, HR 91, O2 97% on room air. RN notified.    Exercises Other Exercises Other Exercises: Pt/son instructed in falls prevention, shoulder discharge instructions, sling mgt, polar care mgt, compression stocking mgt, hemi techniques for dressing; handout provided   Shoulder Instructions      Home Living Family/patient expects to be discharged to:: Private residence Living Arrangements: Spouse/significant other;Children Available Help at Discharge: Family;Available PRN/intermittently (son reports fair coverage throughout the day for family who can be there to help, someone will be there overnight) Type of Home: House Home Access: Stairs to enter [employer] of Steps: 4 Entrance Stairs-Rails: Can reach both Home Layout: Two level;Able to live on main level with bedroom/bathroom     Bathroom Shower/Tub: Occupational psychologist: Handicapped height     Home  Equipment: [place] - built in;Walker - 2 wheels;Cane - single point;Bedside commode          Prior Functioning/Environment Level of Independence: Independent        Comments: Pt indep with amb and ADL, 1 fall in past 1 month.        OT Problem List: Decreased strength;Decreased range of motion;Impaired sensation;Decreased activity tolerance;Impaired balance (sitting and/or standing);Decreased knowledge of use of DME or AE;Impaired UE functional use;Decreased knowledge of precautions      OT Treatment/Interventions: Self-care/ADL training;Therapeutic exercise;DME and/or AE instruction;Patient/family education;Balance training    OT Goals(Current goals can be found in the care plan section) Acute Rehab OT Goals Patient Stated Goal: go home OT Goal Formulation: With patient/family Time For Goal Achievement: 02/29/20 Potential to Achieve Goals: Good ADL Goals Pt Will Perform Upper Body Dressing: sitting;with caregiver independent in assisting Pt Will Transfer to Toilet: with supervision;ambulating (comfort height toilet, LRAD for amb, no LOB) Additional ADL Goal #1: Pt will independently instruct family/caregiver in polar care mgt Additional ADL Goal #2: Pt will independently instruct family/caregiver in compression stocking mgt Additional ADL Goal #3: Pt will independently instruct family/caregiver in shoulder sling/immobilizer mgt  OT Frequency: Min 1X/week   Barriers to D/C:            Co-evaluation PT/OT/SLP Co-Evaluation/Treatment: Yes Reason for Co-Treatment: For patient/therapist safety;To  address functional/ADL transfers PT goals addressed during session: Mobility/safety with mobility;Balance OT goals addressed during session: ADL's and self-care;Proper use of Adaptive equipment and DME      AM-PAC OT "6 Clicks" Daily Activity     Outcome Measure Help from another person eating meals?: A Little Help from another person taking care of personal grooming?: A  Little Help from another person toileting, which includes using toliet, bedpan, or urinal?: A Little Help from another person bathing (including washing, rinsing, drying)?: A Little Help from another person to put on and taking off regular upper body clothing?: A Lot Help from another person to put on and taking off regular lower body clothing?: A Little 6 Click Score: 17   End of Session Nurse Communication: Mobility status;Other (comment) (headache, lightheadedness with ambulating)  Activity Tolerance: Patient tolerated treatment well Patient left: with call bell/phone within reach;in chair;with family/visitor present;with nursing/sitter in room;Other (comment) (polar care in place, sling in place, BP cuff in place)  OT Visit Diagnosis: Other abnormalities of gait and mobility (R26.89);Muscle weakness (generalized) (M62.81);History of falling (Z91.81)                Time: 0321-2248 OT Time Calculation (min): 49 min Charges:  OT General Charges $OT Visit: 1 Visit OT Evaluation $OT Eval Moderate Complexity: 1 Mod OT Treatments $Self Care/Home Management : 38-52 mins  Jeni Salles, MPH, MS, OTR/L ascom [phone] 02/15/20, 4:40 PM

## 2020-02-15 NOTE — Op Note (Signed)
SURGERY DATE: 02/15/2020   PRE-OP DIAGNOSIS:  1. Right shoulder rotator cuff arthropathy   POST-OP DIAGNOSIS:  1. Right shoulder rotator cuff arthropathy   PROCEDURES:  1. Right reverse total shoulder arthroplasty 2. Right biceps tenodesis   SURGEON: Cato Mulligan, MD  ASSISTANTS: Reche Dixon, PA   ANESTHESIA: Gen + interscalene block   ESTIMATED BLOOD LOSS: 300cc   TOTAL IV FLUIDS: per anesthesia record  IMPLANTS: [employer] Surgical: RSP Glenoid Head w/Retaining screw 32-4; Monoblock Reverse Shoulder Baseplate with 9.8PJ central screw; 3 locking screws into baseplate (superior, posterior, inferior); Small Shell Short Humeral Stem 12 x 55m; Neutral Small Socket Insert;    INDICATION(S):  Charlotte MONTEis a 74y.o. female with chronic shoulder pain. Imaging consistent with massive, irreparable rotator cuff tear. Conservative measures including medications and cortisone injections have not provided adequate relief. After discussion of risks, benefits, and alternatives to surgery, the patient elected to proceed with reverse shoulder arthroplasty and biceps tenodesis.   OPERATIVE FINDINGS: massive rotator cuff tear (complete supraspinatus, partial infraspinatus, partial subscapularis), significant glenohumeral arthritis   OPERATIVE REPORT:   I identified Charlotte BILLINGin the pre-operative holding area. Informed consent was obtained and the surgical site was marked. I reviewed the risks and benefits of the proposed surgical intervention and the patient (and/or patient's guardian) wished to proceed. An interscalene block with Exparel was administered by the Anesthesia team. The patient was transferred to the operative suite and general anesthesia was administered. The patient was placed in the beach chair position with the head of the bed elevated approximately 45 degrees. All down side pressure points were appropriately padded. Pre-op exam under anesthesia confirmed some stiffness and  crepitus. Appropriate IV antibiotics were administered. The extremity was then prepped and draped in standard fashion. A time out was performed confirming the correct extremity, correct patient, and correct procedure.   We used the standard deltopectoral incision from the coracoid to ~12cm distal. We found the cephalic vein and took it laterally. We opened the deltopectoral interval widely and placed retractors under the CA ligament in the subacromial space and under the deltoid tendon at its insertion. We then abducted and internally rotated the arm and released the underlying bursa between these retractors, taking care not to damage the circumflex branch of the axillary nerve.   Next, we brought the arm back in adduction at slight forward flexion with external rotation. We opened the clavipectoral fascia lateral to the conjoint tendon. We gently palpated the axillary nerve and verified its position and continuity on both sides of the humerus with a Tug test. This test was repeated multiple times during the procedure for nerve localization and confirmed to be intact at the end of the case. We then cauterized the anterior humeral circumflex ("Three sisters") vessels. The arm was then internally rotated, we cut the falciform ligament at approximately 1 cm of the upper portion of the pectoralis major insertion. Next we unroofed the bicipital groove. We proceeded with a soft tissue biceps tenodesis given the pathology (significant thickening and tendinopathy proximally) of the tendon.  After opening the biceps tendon sheath all the way to the supraglenoid tubercle, we performed a biceps tenodesis with two #2 TiCron sutures to the upper border of the pectoralis major. The proximal portion of the tendon was excised.   At this point, we could see that the supraspinatus was completely torn with a bald humeral head superiorly. The subscapularis also had a partial tear of the superior fibers. We  performed a  subscapularis peel using electrocautery to remove the anterior capsule and subscapularis off of the humeral head. We released the inferior capsule from the humerus all the way to the posterior band of the inferior glenohumeral ligament. When this was complete we gently dislocated the shoulder up into the wound. We removed any osteophytes and made our cut with the appropriate inclination in 30 degrees of retroversion  We then turned our attention back to the glenoid. The proximal humerus was retracted posteriorly. The anterior capsule was dissected free from the subscapularis. The anterior capsule was then excised, exposing the anterior glenoid. We then grasped the labrum and removed it circumferentially. During the glenoid exposure, the axillary nerve was protected the entire time.    A patient-specific guide was used to drill the central guidepin. A tap was placed, matching the trajectory of the guidepin. An appropriately sized reamer was used to ream the glenoid. The monoblock baseplate was inserted and excellent fixation was achieved such that the entire scapula rotated with further attempted seating of the baseplate. The 3 peripheral screws were drilled, measured, and placed. A 32-4 glenosphere was then placed and tightened.   We then turned our attention back to the humerus. We sized for a small-shell prosthesis. We sequentially used larger diameter canal finders until we met significant resistance and sequential broaching was performed to this size listed above. We trialed both a 0 and +4 poly. The humerus was trialed and noted to have satisfactory stability, motion, and deltoid tension with a 0 poly. We then placed the humeral stem and placed an 0 poly. Bone graft from the humeral head was packed lateral to the implant. The humerus was reduced and final confirmation of motion, tension, and stability were satisfactory. A Hemovac drain was placed. Subscapularis was repaired with the previously passed #2  FiberWire sutures after passing them through the subscapularis.   We again verified the tension on the axillary nerve, appropriate range of motion, stability of the implant, and security of the subscapularis repair. We closed the deltopectoral interval deep to the cephalic vein with a running, 0-Vicryl suture. The skin was closed with 2-0 Vicryl and 4-0 Monocryl. Xeroform and Honeycomb dressing was applied. A PolarCare unit and sling were placed. Patient was extubated, transferred to a stretcher bed and to the post antesthesia care unit in stable condition.   Of note, assistance from a PA was essential to performing the surgery.  PA was present for the entire surgery.  PA assisted with patient positioning, retraction, instrumentation, and wound closure. The surgery would have been more difficult and had longer operative time without PA assistance.    POSTOPERATIVE PLAN: The patient will be discharged home on POD#90. Operative arm to remain in sling at all times except RoM exercises and hygiene. Can perform pendulums, elbow/wrist/hand RoM exercises. Passive RoM allowed to 90 FF and 30 ER. ASA 378m x 6 weeks for DVT ppx. Plan for outpatient PT starting on POD #3-4. Patient to return to clinic in ~2 weeks for post-operative appointment.

## 2020-02-15 NOTE — H&P (Signed)
Paper H&P to be scanned into permanent record. H&P reviewed. No significant changes noted.  

## 2020-02-15 NOTE — Anesthesia Procedure Notes (Signed)
Procedure Name: Intubation Date/Time: 02/15/2020 7:43 AM Performed by: Debe Coder, CRNA Pre-anesthesia Checklist: Patient identified, Emergency Drugs available, Suction available and Patient being monitored Patient Re-evaluated:Patient Re-evaluated prior to induction Oxygen Delivery Method: Circle system utilized Preoxygenation: Pre-oxygenation with 100% oxygen Induction Type: IV induction Ventilation: Mask ventilation without difficulty Laryngoscope Size: Mac and 3 Grade View: Grade I Tube type: Oral Tube size: 7.5 mm Number of attempts: 1 Airway Equipment and Method: Stylet and Oral airway Placement Confirmation: ETT inserted through vocal cords under direct vision,  positive ETCO2 and breath sounds checked- equal and bilateral Secured at: 20 cm Tube secured with: Tape Dental Injury: Teeth and Oropharynx as per pre-operative assessment

## 2020-02-15 NOTE — Anesthesia Postprocedure Evaluation (Signed)
Anesthesia Post Note  Patient: Charlotte Burns  Procedure(s) Performed: Right reverse shoulder arthroplasty, biceps tenodesis (Right Shoulder)  Patient location during evaluation: PACU Anesthesia Type: Regional and General Level of consciousness: awake and alert and oriented Pain management: pain level controlled Vital Signs Assessment: post-procedure vital signs reviewed and stable Respiratory status: spontaneous breathing, nonlabored ventilation and respiratory function stable Cardiovascular status: blood pressure returned to baseline and stable Postop Assessment: no signs of nausea or vomiting Anesthetic complications: no   No complications documented.   Last Vitals:  Vitals:   02/15/20 1159 02/15/20 1239  BP: 135/65 (!) 121/58  Pulse: 86 90  Resp: 18 18  Temp: 36.6 C   SpO2: 92% 99%    Last Pain:  Vitals:   02/15/20 1159  TempSrc: Oral  PainSc: 0-No pain                 Devani Odonnel

## 2020-02-15 NOTE — Transfer of Care (Signed)
Immediate Anesthesia Transfer of Care Note  Patient: Charlotte Burns  Procedure(s) Performed: Right reverse shoulder arthroplasty, biceps tenodesis (Right Shoulder)  Patient Location: PACU  Anesthesia Type:GA combined with regional for post-op pain  Level of Consciousness: drowsy  Airway & Oxygen Therapy: Patient Spontanous Breathing and Patient connected to face mask oxygen  Post-op Assessment: Report given to RN and Post -op Vital signs reviewed and stable  Post vital signs: Reviewed and stable  Last Vitals:  Vitals Value Taken Time  BP 139/84 02/15/20 1051  Temp    Pulse 81 02/15/20 1101  Resp 17 02/15/20 1101  SpO2 100 % 02/15/20 1101  Vitals shown include unvalidated device data.  Last Pain:  Vitals:   02/15/20 1051  TempSrc:   PainSc: Asleep         Complications: No complications documented.

## 2020-02-15 NOTE — Progress Notes (Signed)
Physical Therapy Treatment Patient Details Name: Charlotte Burns MRN: [id number] DOB: 07-11-1945 Today's Date: 02/15/2020    History of Present Illness Pt is a 74 y.o. female s/p R reverse total shoulder arthroplasty and R biceps tenodesis 9/13.  Pt with interscalene brachial plexus block.  PMH includes DM, anemia, chest tightness, h/o shingles, PNA, SOB occasional (usually with activity).    PT Comments    Pt seen for PT/OT co-treat.  D/t pt's dizziness earlier this afternoon with ambulation, therapist returned to attempt further ambulation and stairs trial with pt for attempt to work towards home discharge today.  Pt requesting to toilet and able to ambulate 40 feet with CGA to min assist for balance.  After toileting pt able to ambulate about 10 feet before reporting feeling dizzy and lightheaded (pt also noted to be more unsteady with ambulation): BP 181/86, HR 91 bpm, and O2 97% on room air; 2/10 headache; nurse notified immediately.  Further therapy deferred d/t pt's c/o dizziness and lightheadedness with ambulation.  Will continue to focus on strengthening and progressive functional mobility during pt's hospitalization.   Follow Up Recommendations  Outpatient PT     Equipment Recommendations  None recommended by PT    Recommendations for Other Services OT consult     Precautions / Restrictions Precautions Precautions: Fall;Shoulder Shoulder Interventions: Shoulder sling/immobilizer;At all times;Off for dressing/bathing/exercises Precaution Booklet Issued: Yes (comment) Restrictions Weight Bearing Restrictions: Yes RUE Weight Bearing: Non weight bearing    Mobility  Bed Mobility               General bed mobility comments: Pt up in recliner at start and end of session  Transfers Overall transfer level: Needs assistance Equipment used: None Transfers: Sit to/from Stand Sit to Stand: Min guard         General transfer comment: x1 trial standing from recliner  and x1 trial standing from toilet  Ambulation/Gait Ambulation/Gait assistance: Min guard;Min assist Gait Distance (Feet): 40 Feet (plus 10 feet to recliner) Assistive device: None   Gait velocity: decreased   General Gait Details: increased B lateral sway requiring CGA to min assist to steady   Stairs Stairs:  (Deferred d/t pt's c/o dizziness and lightheadedness with ambulation)           Wheelchair Mobility    Modified Rankin (Stroke Patients Only)       Balance Overall balance assessment: Needs assistance Sitting-balance support: No upper extremity supported;Feet supported Sitting balance-Leahy Scale: Good Sitting balance - Comments: steady sitting reaching within BOS with L UE   Standing balance support: Single extremity supported Standing balance-Leahy Scale: Poor Standing balance comment: pt requiring at least single UE support for dynamic standing balance                            Cognition Arousal/Alertness: Awake/alert Behavior During Therapy: WFL for tasks assessed/performed Overall Cognitive Status: Within Functional Limits for tasks assessed                                 General Comments: Pt sitting up in recliner with R UE sling in place      Exercises     General Comments General comments (skin integrity, edema, etc.): R UE sling in place      Pertinent Vitals/Pain Pain Assessment: 0-10 Pain Score: 2  Pain Location: headache after ambulating to bathroom and toileting  Pain Descriptors / Indicators: Headache Pain Intervention(s): Limited activity within patient's tolerance;Monitored during session;Repositioned;Other (comment) (nurse notified)    Home Living Family/patient expects to be discharged to:: Private residence Living Arrangements: Spouse/significant other;Children (pt's husband and 2 sons) Available Help at Discharge: Family;Available PRN/intermittently Type of Home: House Home Access: Stairs to  enter Entrance Stairs-Rails: Right;Left Home Layout: Two level;Able to live on main level with bedroom/bathroom Home Equipment: Shower seat - built in;Walker - 2 wheels;Cane - single point;Bedside commode      Prior Function Level of Independence: Independent      Comments: 1 fall in past 1 month (in bathtub/shower)   PT Goals (current goals can now be found in the care plan section) Acute Rehab PT Goals Patient Stated Goal: go home PT Goal Formulation: With patient Time For Goal Achievement: 02/29/20 Potential to Achieve Goals: Good Progress towards PT goals: Progressing toward goals    Frequency    BID      PT Plan Current plan remains appropriate    Co-evaluation PT/OT/SLP Co-Evaluation/Treatment: Yes Reason for Co-Treatment: For patient/therapist safety;To address functional/ADL transfers PT goals addressed during session: Mobility/safety with mobility;Balance OT goals addressed during session: ADL's and self-care;Proper use of Adaptive equipment and DME      AM-PAC PT "6 Clicks" Mobility   Outcome Measure  Help needed turning from your back to your side while in a flat bed without using bedrails?: A Little Help needed moving from lying on your back to sitting on the side of a flat bed without using bedrails?: A Little Help needed moving to and from a bed to a chair (including a wheelchair)?: A Little Help needed standing up from a chair using your arms (e.g., wheelchair or bedside chair)?: A Little Help needed to walk in hospital room?: A Little Help needed climbing 3-5 steps with a railing? : A Little 6 Click Score: 18    End of Session Equipment Utilized During Treatment: Gait belt Activity Tolerance: Other (comment) (limited d/t c/o dizziness/lightheadedness with ambulation) Patient left: in chair;with call bell/phone within reach;with family/visitor present;Other (comment) (polar care in place and activated) Nurse Communication: Mobility  status;Precautions;Weight bearing status;Other (comment) (pt's c/o lightheadedness and dizziness) PT Visit Diagnosis: Unsteadiness on feet (R26.81);Other abnormalities of gait and mobility (R26.89);Muscle weakness (generalized) (M62.81);History of falling (Z91.81);Difficulty in walking, not elsewhere classified (R26.2);Dizziness and giddiness (R42);Pain Pain - Right/Left: Right Pain - part of body: Shoulder     Time: 4562-5638 PT Time Calculation (min) (ACUTE ONLY): 19 min  Charges:  $Therapeutic Activity: 8-22 mins                     Leitha Bleak, PT 02/15/20, 5:43 PM

## 2020-02-15 NOTE — Progress Notes (Signed)
Plan was for discharge home today with RSA surgery as an outpatient. However, patient is having significant dizziness with attempting to ambulate. PT/OT do not recommend home-going based on this. Therefore, we will plan for overnight admission with discharge tomorrow.  POSTOP PLAN: - PT/OT again in AM - Ancef x 24 hours - DVT ppx: ASA 325mg /day starting on POD#1  - Pain control: Tylenol scheduled + oxycodone PO prn. Patient had exparel interscalene nerve block so pain should be relatively well-controlled.  - CBC/BMP in AM - Continue IV fluids.

## 2020-02-15 NOTE — Discharge Instructions (Signed)
Cato Mulligan, MD  [facility]  Phone: [phone]  Fax: [phone]   Discharge Instructions after Reverse Shoulder Replacement   1. Activity/Sling: You are to be non-weight bearing on operative extremity. A sling/shoulder immobilizer has been provided for you. Only remove the sling to perform elbow, wrist, and hand RoM exercises and hygiene/dressing. Active reaching and lifting are not permitted. You will be given further instructions on sling use at your first physical therapy visit and postoperative visit with Dr. Posey Pronto.   2. Dressings: Dressing may be removed at 1st physical therapy visit (~3-4 days after surgery). Afterwards, you may either leave open to air (if no drainage) or cover with dry, sterile dressing. If you have steri-strips on your wound, please do not remove them. They will fall off on their own. You may shower 5 days after surgery. Please pat incision dry. Do not rub or place any shear forces across incision. If there is drainage or any opening of incision after 5 days, please notify our offices immediately.   3. Driving:  Plan on not driving for six weeks. Please note that you are advised NOT to drive while taking narcotic pain medications as you may be impaired and unsafe to drive.  4. Medications:  - You have been provided a prescription for narcotic pain medicine (usually oxycodone). After surgery, take 1-2 narcotic tablets every 4 hours if needed for severe pain. Please start this as soon as you begin to start having pain (if you received a nerve block, start taking as soon as this wears off).  - A prescription for anti-nausea medication will be provided in case the narcotic medicine causes nausea - take 1 tablet every 6 hours only if nauseated.  - Take enteric coated aspirin 325 mg once daily for 6 weeks to prevent blood clots. Do not take aspirin if you have an aspirin sensitivity/allergy or asthma or are on an anticoagulant (blood thinner) already. If so, then  your home anticoagulant will be resume and managed - do not take aspirin. -Take tylenol 1000mg  (2 Extra strength or 3 regular strength tablets) every 8 hours for pain. This will reduce the amount of narcotic medication needed. May stop tylenol when you are having minimal pain. - Take a stool softener (Colace, Dulcolax or Senakot) if you are using narcotic pain medications to help with constipation that is associated with narcotic use. - DO NOT take ANY nonsteroidal anti-inflammatory pain medications: Advil, Motrin, Ibuprofen, Aleve, Naproxen, or Naprosyn.  If you are taking prescription medication for anxiety, depression, insomnia, muscle spasm, chronic pain, or for attention deficit disorder you are advised that you are at a higher risk of adverse effects with use of narcotics post-op, including narcotic addiction/dependence, depressed breathing, death. If you use non-prescribed substances: alcohol, marijuana, cocaine, heroin, methamphetamines, etc., you are at a higher risk of adverse effects with use of narcotics post-op, including narcotic addiction/dependence, depressed breathing, death. You are advised that taking > 50 morphine milligram equivalents (MME) of narcotic pain medication per day results in twice the risk of overdose or death. For your prescription provided: oxycodone 5 mg - taking more than 6 tablets per day after the first few days of surgery.  5. Physical Therapy: 1-2 times per week for ~12 weeks. Therapy typically starts on post operative Day 3 or 4. You have been provided an order for physical therapy. The therapist will provide home exercises. Please contact our offices if this appointment has not been scheduled.   6. Work: May do  light duty/desk job in approximately 2 weeks when off of narcotics, pain is well-controlled, and swelling has decreased if able to function with one arm in sling. Full work may take 6 weeks if light motions and function of both arms is required.  Lifting jobs may require 12 weeks.  7. Post-Op Appointments: Your first post-op appointment will be with Dr. Posey Pronto in approximately 2 weeks time.   If you find that they have not been scheduled please call the Orthopaedic Appointment front desk at [phone].                               Cato Mulligan, MD [facility] Phone: [phone] Fax: [phone]   REVERSE SHOULDER ARTHROPLASTY REHAB GUIDELINES   These guidelines should be tailored to individual patients based on their rehab goals, age, precautions, quality of repair, etc.  Progression should be based on patient progress and approval by the referring physician.  PHASE 1 - Day 1 through Week 2  GENERAL GUIDELINES AND PRECAUTIONS  Sling wear 24/7 except during grooming and home exercises (3 to 5 times daily)  Avoid shoulder extension such that the arm is posterior the frontal plane.  When patients recline, a pillow should be placed behind the upper arm and sling should be on.  They should be advised to always be able to see the elbow  Avoid combined IR/ADD/EXT, such as hand behind back to prevent dislocation  Avoid combined IR and ADD such as reaching across the chest to prevent dislocation  No AROM  No submersion in pool/water for 4 weeks  No weight bearing through operative arm (as in transfers, walker use, etc)  GOALS  Maintain integrity of joint replacement; protect soft tissue healing  Increase PROM for elevation to 120 and ER to 30 (will remain the goal for first 6 weeks)  Optimize distal UE circulation and muscle activity (elbow, wrist and hand)  Instruct in use of sling for proper fit, polar care device for ice application after HEP, signs/symptoms of infection  EXERCISES  Active elbow, wrist and hand  Passive forward elevation in scapular plane to 90-120 max motion; ER in scapular plane to 30  Active scapular retraction with arms resting in neutral  position  CRITERIA TO PROGRESS TO PHASE 2  Low pain (less than 3/10) with shoulder PROM  Healing of incision without signs of infection  Clearance by MD to advance after 2 week MD check up  PHASE 2 - 2 weeks - 6 weeks  GENERAL GUIDELINES AND PRECAUTIONS  Sling may be removed while at home; worn in community without abduction pillow  May use arm for light activities of daily living (such as feeding, brushing teeth, dressing) with elbow near  the side of the body  and arm in front of the body- no active lifting of the arm  May submerge in water (tub, pool, [place], etc) after 4 weeks  Continue to avoid WBing through the operative arm  Continue to avoid combined IR/EXT/ADD (hand behind the back) and IR/ADD  (reaching across chest) for dislocation precautions  GOALS   Achieve passive elevation to 120 and ER to 30   Low (less than 3/10) to no pain   Ability to fire all heads of the deltoid  EXERCISES  May discontinue grip, and active elbow and wrist exercises since using the arm in ADLs  with sling removed around the home  Continue passive elevation to 120 and  ER to 30, both in scapular plane with arm supported on table top  Add submaximal isometrics, pain free effort, for all functional heads of deltoid (anterior, posterior, middle)  Ensure that with posterior deltoid isometric the shoulder does not move into extension and the arm remains anterior the frontal plane  At 4 weeks:  begin to place arm in balanced position of 90 deg elevation in supine; when patient able to hold this position with ease, may begin reverse pendulums clockwise and counterclockwise  CRITERIA TO PROGRESS TO PHASE 3  Passive forward elevation in scapular plane to 120; passive ER in scapular plane to 30  Ability to fire isometrically all heads of the deltoid muscle without pain  Ability to place and hold the arm in balanced position (90 deg elevation in supine)  PHASE 3 - 6 weeks to 3  months  GENERAL GUIDELINES AND PRECAUTIONS  Discontinue use of sling  Avoid forcing end range motion in any direction to prevent dislocation   May advance use of the arm actively in ADLs without being restricted to arm by the side of the body, however, avoid heavy lifting and sports (forever!)  May initiate functional IR behind the back gently  NO UPPER BODY ERGOMETER   GOALS  Optimize PROM for elevation and ER in scapular plane with realistic expectation that max  mobility for elevation is usually around 145-160 passively; ER 40 to 50 passively; functional IR to L1  Recover AROM to approach as close to PROM available as possible; may expect 135-150 deg active elevation; 30 deg active ER; active functional IR to L1  Establish dynamic stability of the shoulder with deltoid and periscapular muscle gradual strengthening  EXERCISES  Forward elevation in scapular plane active progression: supine to incline, to vertical; short to long lever arm  Balanced position long lever arm AROM  Active ER/IR with arm at side  Scapular retraction with light band resistance  Functional IR with hand slide up back - very gentle and gradual  NO UPPER BODY ERGOMETER     CRITERIA TO PROGRESS TO PHASE 4   AROM equals/approaches PROM with good mechanics for elevation    No pain   Higher level demand on shoulder than ADL functions   PHASE 4 12 months and beyond  GENERAL GUIDELINES AND PRECAUTIONS  No heavy lifting and no overhead sports  No heavy pushing activity  Gradually increase strength of deltoid and scapular stabilizers; also the rotator cuff if present with weights not to exceed 5 lbs  NO UPPER BODY ERGOMETER   GOALS   Optimize functional use of the operative UE to meet the desired demands   Gradual increase in deltoid, scapular muscle, and rotator cuff strength   Pain free functional activities   EXERCISES  Add light hand weights for deltoid up to and not to exceed  3 lbs for anterior and posterior with long arm lift against gravity; elbow bent to 90 deg for abduction in scapular plane  Theraband progression for extension to hip with scapular depression/retraction  Theraband progression for serratus anterior punches in supine; avoid wall, incline or prone pressups for serratus anterior  End range stretching gently without forceful overpressure in all planes (elevation in scapular plane, ER in scapular plane, functional IR) with stretching done for life as part of a daily routine  NO UPPER BODY ERGOMETER     CRITERIA FOR DISCHARGE FROM SKILLED PHYSICAL THERAPY   Pain free AROM for shoulder elevation (expect around 135-150)   Functional  strength for all ADLs, work tasks, and hobbies approved by Psychologist, sport and exercise   Independence with home maintenance program   NOTES: 1. With proper exercise, motion, strength, and function continue to improve even after one year. 2. The complication rate after surgery is 5 - 8%. Complications include infection, fracture, heterotopic bone formation, nerve injury, instability, rotator cuff tear, and tuberosity nonunion. Please look for clinical signs, unusual symptoms, or lack of progress with therapy and report those to Dr. Posey Pronto. Prefer more communication than less.  3. The therapy plan above only serves as a guide. Please be aware of specific individualized patient instructions as written on the prescription or through discussions with the surgeon. 4. Please call Dr. Posey Pronto if you have any specific questions or concerns [phone]        Interscalene Nerve Block with Exparel  1.  For your surgery you have received an Interscalene Nerve Block with Exparel. 2. Nerve Blocks affect many types of nerves, including nerves that control movement, pain and normal sensation.  You may experience feelings such as numbness, tingling, heaviness, weakness or the inability to move your arm or the feeling or sensation that your arm has  "fallen asleep". 3. A nerve block with Exparel can last up to 5 days.  Usually the weakness wears off first.  The tingling and heaviness usually wear off next.  Finally you may start to notice pain.  Keep in mind that this may occur in any order.  Once a nerve block starts to wear off it is usually completely gone within 60 minutes. 4. ISNB may cause mild shortness of breath, a hoarse voice, blurry vision, unequal pupils, or drooping of the face on the same side as the nerve block.  These symptoms will usually resolve with the numbness.  Very rarely the procedure itself can cause mild seizures. 5. If needed, your surgeon will give you a prescription for pain medication.  It will take about 60 minutes for the oral pain medication to become fully effective.  So, it is recommended that you start taking this medication before the nerve block first begins to wear off, or when you first begin to feel discomfort. 6. Take your pain medication only as prescribed.  Pain medication can cause sedation and decrease your breathing if you take more than you need for the level of pain that you have. 7. Nausea is a common side effect of many pain medications.  You may want to eat something before taking your pain medicine to prevent nausea. 8. After an Interscalene nerve block, you cannot feel pain, pressure or extremes in temperature in the effected arm.  Because your arm is numb it is at an increased risk for injury.  To decrease the possibility of injury, please practice the following:  a. While you are awake change the position of your arm frequently to prevent too much pressure on any one area for prolonged periods of time. b.  If you have a cast or tight dressing, check the color or your fingers every couple of hours.  Call your surgeon with the appearance of any discoloration (white or blue). c. If you are given a sling to wear before you go home, please wear it  at all times until the block has completely worn off.   Do not get up at night without your sling. d. Please contact [facility] or your surgeon if you do not begin to regain sensation after 7 days from the surgery.  Anesthesia may be contacted  by calling the Same Day Surgery Department, Mon. through Fri., 6 am to 4 pm at [phone].   e. If you experience any other problems or concerns, please contact your surgeon's office. f. If you experience severe or prolonged shortness of breath go to the nearest emergency department.

## 2020-02-15 NOTE — Anesthesia Procedure Notes (Signed)
Anesthesia Regional Block: Interscalene brachial plexus block   Pre-Anesthetic Checklist: ,, timeout performed, Correct Patient, Correct Site, Correct Laterality, Correct Procedure, Correct Position, site marked, Risks and benefits discussed,  Surgical consent,  Pre-op evaluation,  At surgeon's request and post-op pain management  Laterality: Right  Prep: chloraprep       Needles:  Injection technique: Single-shot  Needle Type: Stimiplex     Needle Length: 10cm  Needle Gauge: 21     Additional Needles:   Procedures:,,,, ultrasound used (permanent image in chart),,,,  Narrative:  Start time: 02/15/2020 7:12 AM End time: 02/15/2020 7:17 AM Injection made incrementally with aspirations every 5 mL.  Performed by: Personally  Anesthesiologist: Emmie Niemann, MD  Additional Notes: Functioning IV was confirmed and monitors were applied.  A Stimuplex needle was used. Sterile prep and drape,hand hygiene and sterile gloves were used.  Negative aspiration and negative test dose prior to incremental administration of local anesthetic. The patient tolerated the procedure well.

## 2020-02-15 NOTE — Progress Notes (Signed)
°   02/15/20 0725  Clinical Encounter Type  Visited With Patient  Visit Type Initial  Referral From Chaplain  Consult/Referral To Chaplain  While rounding SDS, chaplain briefly visited with patient. Staff came in to tend to her, therefore chaplain left wishing her well.

## 2020-02-15 NOTE — Anesthesia Preprocedure Evaluation (Signed)
Anesthesia Evaluation  Patient identified by MRN, date of birth, ID band Patient awake    Reviewed: Allergy & Precautions, NPO status , Patient's Chart, lab work & pertinent test results  History of Anesthesia Complications Negative for: history of anesthetic complications  Airway Mallampati: II  TM Distance: >3 FB Neck ROM: Full    Dental  (+) Poor Dentition, Missing   Pulmonary neg sleep apnea, neg COPD, former smoker,    breath sounds clear to auscultation- rhonchi (-) wheezing      Cardiovascular Exercise Tolerance: Good (-) hypertension(-) CAD, (-) Past MI, (-) Cardiac Stents and (-) CABG  Rhythm:Regular Rate:Normal - Systolic murmurs and - Diastolic murmurs    Neuro/Psych neg Seizures negative neurological ROS  negative psych ROS   GI/Hepatic Neg liver ROS, GERD  ,  Endo/Other  diabetes, Oral Hypoglycemic Agents  Renal/GU negative Renal ROS     Musculoskeletal  (+) Arthritis ,   Abdominal   Peds  Hematology  (+) anemia ,   Anesthesia Other Findings Past Medical History: No date: Arthritis No date: Chest tightness     Comment:  ECHO AND STRESS  No date: Diabetes mellitus without complication (HCC) No date: DJD (degenerative joint disease) No date: GERD (gastroesophageal reflux disease) No date: H/O hypercholesterolemia     Comment:  told one time No date: History of shingles     Comment:  left side of face jan 2015 No date: Pneumonia     Comment:  hx of No date: Shortness of breath     Comment:  occasional, usually with activity   Reproductive/Obstetrics                             Anesthesia Physical Anesthesia Plan  ASA: III  Anesthesia Plan: General   Post-op Pain Management:  Regional for Post-op pain   Induction: Intravenous  PONV Risk Score and Plan: 2 and Ondansetron and Midazolam  Airway Management Planned: Oral ETT  Additional Equipment:   Intra-op  Plan:   Post-operative Plan: Extubation in OR  Informed Consent: I have reviewed the patients History and Physical, chart, labs and discussed the procedure including the risks, benefits and alternatives for the proposed anesthesia with the patient or authorized representative who has indicated his/her understanding and acceptance.     Dental advisory given  Plan Discussed with: CRNA and Anesthesiologist  Anesthesia Plan Comments:         Anesthesia Quick Evaluation

## 2020-02-15 NOTE — Evaluation (Signed)
Physical Therapy Evaluation Patient Details Name: Charlotte Burns MRN: [id number] DOB: Apr 22, 1946 Today's Date: 02/15/2020   History of Present Illness  Pt is a 74 y.o. female s/p R reverse total shoulder arthroplasty and R biceps tenodesis 9/13.  Pt with interscalene brachial plexus block.  PMH includes DM, anemia, chest tightness, h/o shingles, PNA, SOB occasional (usually with activity).  Clinical Impression  Prior to hospital admission, pt was independent with ambulation; lives with her husband and 2 sons; stays on main level of home; 4 STE with B railings.  Currently pt is CGA with transfers and min assist with ambulation with L UE hand hold assist for balance.  Limited distance ambulating d/t pt reporting not feeling well and felt dizzy (BP 124/67 with HR 100 bpm).  Pt would benefit from skilled PT to address noted impairments and functional limitations (see below for any additional details).  Upon hospital discharge, pt would benefit from OP PT (per chart pt has OP PT scheduled for POD #3).    Follow Up Recommendations Outpatient PT    Equipment Recommendations  None recommended by PT    Recommendations for Other Services OT consult     Precautions / Restrictions Precautions Precautions: Fall;Shoulder Shoulder Interventions: Shoulder sling/immobilizer;At all times;Off for dressing/bathing/exercises Precaution Booklet Issued: Yes (comment) Restrictions Weight Bearing Restrictions: Yes RUE Weight Bearing: Non weight bearing      Mobility  Bed Mobility               General bed mobility comments: Pt up in recliner at start and end of session  Transfers Overall transfer level: Needs assistance Equipment used: None Transfers: Sit to/from Stand Sit to Stand: Min guard         General transfer comment: x2 trials standing from recliner; vc's for L UE placement  Ambulation/Gait Ambulation/Gait assistance: Min assist Gait Distance (Feet): 80 Feet Assistive  device: 1 person hand held assist   Gait velocity: decreased   General Gait Details: increased lateral sway R > L requiring CGA to min assist to steady  Stairs            Wheelchair Mobility    Modified Rankin (Stroke Patients Only)       Balance Overall balance assessment: Needs assistance Sitting-balance support: No upper extremity supported;Feet supported Sitting balance-Leahy Scale: Good Sitting balance - Comments: steady sitting reaching within BOS with L UE   Standing balance support: Single extremity supported Standing balance-Leahy Scale: Poor Standing balance comment: pt requiring at least single UE support for static standing balance                             Pertinent Vitals/Pain Pain Assessment: 0-10 Pain Score: 2  Pain Location: R shoulder blade Pain Descriptors / Indicators: Discomfort Pain Intervention(s): Limited activity within patient's tolerance;Monitored during session;Repositioned    Home Living Family/patient expects to be discharged to:: Private residence Living Arrangements: Spouse/significant other;Children (pt's husband and 2 sons) Available Help at Discharge: Family;Available PRN/intermittently Type of Home: House Home Access: Stairs to enter Entrance Stairs-Rails: Right;Left Entrance Stairs-Number of Steps: 4 Home Layout: Two level;Able to live on main level with bedroom/bathroom Home Equipment: Shower seat - built in;Walker - 2 wheels;Cane - single point;Bedside commode      Prior Function Level of Independence: Independent         Comments: 1 fall in past 1 month (in bathtub/shower)     Hand Dominance   Dominant Hand:  Right    Extremity/Trunk Assessment   Upper Extremity Assessment Upper Extremity Assessment: Defer to OT evaluation RUE Deficits / Details: s/p rev R TSA RUE: Unable to fully assess due to immobilization RUE Sensation: decreased light touch RUE Coordination: decreased gross motor;decreased  fine motor    Lower Extremity Assessment Lower Extremity Assessment: Generalized weakness    Cervical / Trunk Assessment Cervical / Trunk Assessment: Normal  Communication   Communication: No difficulties  Cognition Arousal/Alertness: Awake/alert Behavior During Therapy: WFL for tasks assessed/performed Overall Cognitive Status: Within Functional Limits for tasks assessed                                 General Comments: Pt sitting up in recliner with R UE sling in place      General Comments General comments (skin integrity, edema, etc.): R UE sling in place    Exercises    Assessment/Plan    PT Assessment Patient needs continued PT services  PT Problem List Decreased strength;Decreased activity tolerance;Decreased balance;Decreased mobility;Decreased knowledge of use of DME;Cardiopulmonary status limiting activity;Decreased knowledge of precautions;Decreased safety awareness;Decreased skin integrity;Pain       PT Treatment Interventions DME instruction;Gait training;Stair training;Functional mobility training;Therapeutic activities;Therapeutic exercise;Balance training;Patient/family education    PT Goals (Current goals can be found in the Care Plan section)  Acute Rehab PT Goals Patient Stated Goal: go home PT Goal Formulation: With patient Time For Goal Achievement: 02/29/20 Potential to Achieve Goals: Good    Frequency BID   Barriers to discharge        Co-evaluation   Reason for Co-Treatment: For patient/therapist safety;To address functional/ADL transfers PT goals addressed during session: Mobility/safety with mobility;Balance OT goals addressed during session: ADL's and self-care;Proper use of Adaptive equipment and DME       AM-PAC PT "6 Clicks" Mobility  Outcome Measure Help needed turning from your back to your side while in a flat bed without using bedrails?: A Little Help needed moving from lying on your back to sitting on the side  of a flat bed without using bedrails?: A Little Help needed moving to and from a bed to a chair (including a wheelchair)?: A Little Help needed standing up from a chair using your arms (e.g., wheelchair or bedside chair)?: A Little Help needed to walk in hospital room?: A Little Help needed climbing 3-5 steps with a railing? : A Little 6 Click Score: 18    End of Session Equipment Utilized During Treatment: Gait belt Activity Tolerance: Other (comment) (limited d/t c/o dizziness with ambulation) Patient left: in chair;with call bell/phone within reach;Other (comment) (polar care in place and activated) Nurse Communication: Mobility status;Precautions;Weight bearing status;Other (comment) (pt's dizziness) PT Visit Diagnosis: Unsteadiness on feet (R26.81);Other abnormalities of gait and mobility (R26.89);Muscle weakness (generalized) (M62.81);History of falling (Z91.81);Difficulty in walking, not elsewhere classified (R26.2);Dizziness and giddiness (R42);Pain Pain - Right/Left: Right Pain - part of body: Shoulder    Time: 4765-4650 PT Time Calculation (min) (ACUTE ONLY): 43 min   Charges:   PT Evaluation $PT Eval Low Complexity: 1 Low PT Treatments $Therapeutic Activity: 23-37 mins       Leitha Bleak, PT 02/15/20, 5:20 PM

## 2020-02-16 ENCOUNTER — Encounter: Payer: Self-pay | Admitting: Orthopedic Surgery

## 2020-02-16 DIAGNOSIS — M129 Arthropathy, unspecified: Secondary | ICD-10-CM | POA: Diagnosis not present

## 2020-02-16 LAB — BASIC METABOLIC PANEL
Anion gap: 8 (ref 5–15)
BUN: 12 mg/dL (ref 8–23)
CO2: 22 mmol/L (ref 22–32)
Calcium: 10.3 mg/dL (ref 8.9–10.3)
Chloride: 106 mmol/L (ref 98–111)
Creatinine, Ser: 0.75 mg/dL (ref 0.44–1.00)
GFR calc Af Amer: >60 mL/min (ref >60)
GFR calc non Af Amer: >60 mL/min (ref >60)
Glucose, Bld: 209 mg/dL — ABNORMAL HIGH (ref 70–99)
Potassium: 3.9 mmol/L (ref 3.5–5.1)
Sodium: 136 mmol/L (ref 135–145)

## 2020-02-16 LAB — SURGICAL PATHOLOGY

## 2020-02-16 LAB — CBC
HCT: 32.8 % — ABNORMAL LOW (ref 36.0–46.0)
Hemoglobin: 11.8 g/dL — ABNORMAL LOW (ref 12.0–15.0)
MCH: 33.4 pg (ref 26.0–34.0)
MCHC: 36 g/dL (ref 30.0–36.0)
MCV: 92.9 fL (ref 80.0–100.0)
Platelets: 178 10*3/uL (ref 150–400)
RBC: 3.53 MIL/uL — ABNORMAL LOW (ref 3.87–5.11)
RDW: 12.2 % (ref 11.5–15.5)
WBC: 7.3 10*3/uL (ref 4.0–10.5)
nRBC: 0 % (ref 0.0–0.2)

## 2020-02-16 NOTE — TOC Initial Note (Signed)
Transition of Care [facility]) - Initial/Assessment Note    Patient Details  Name: Charlotte Burns MRN: [id number] Date of Birth: September 17, 1945  [facility] CM/SW Contact:    Shelbie Ammons, RN Phone Number: 02/16/2020, 10:42 AM  Clinical Narrative:     RNCM assessed patient at bedside having just completed OT session. Patient reports to feeling well and that she is ready to go home today. She reports that she believes she is set up with OP therapy through [facility] but she will call and follow up with them. She denies any other needs and reports that her son will pick her up from the hospital.               Expected Discharge Plan: OP Rehab Barriers to Discharge: No Barriers Identified   Patient Goals and CMS Choice Patient states their goals for this hospitalization and ongoing recovery are:: to get home      Expected Discharge Plan and Services Expected Discharge Plan: OP Rehab       Living arrangements for the past 2 months: Single Family Home Expected Discharge Date: 02/16/20                                    Prior Living Arrangements/Services Living arrangements for the past 2 months: Single Family Home Lives with:: Spouse Patient language and need for interpreter reviewed:: Yes Do you feel safe going back to the place where you live?: Yes      Need for Family Participation in Patient Care: Yes (Comment) Care giver support system in place?: Yes (comment)   Criminal Activity/Legal Involvement Pertinent to Current Situation/Hospitalization: No - Comment as needed  Activities of Daily Living Home Assistive Devices/Equipment: Dentures (specify type) ADL Screening (condition at time of admission) Patient's cognitive ability adequate to safely complete daily activities?: Yes Is the patient deaf or have difficulty hearing?: No Does the patient have difficulty seeing, even when wearing glasses/contacts?: No Does the patient have difficulty concentrating,  remembering, or making decisions?: No Patient able to express need for assistance with ADLs?: Yes Does the patient have difficulty dressing or bathing?: No Independently performs ADLs?: Yes (appropriate for developmental age) Does the patient have difficulty walking or climbing stairs?: No Weakness of Legs: Both Weakness of Arms/Hands: Both  Permission Sought/Granted                  Emotional Assessment Appearance:: Appears stated age Attitude/Demeanor/Rapport: Engaged Affect (typically observed): Appropriate Orientation: : Oriented to Self, Oriented to Place, Oriented to  Time, Oriented to Situation Alcohol / Substance Use: Never Used Psych Involvement: No (comment)  Admission diagnosis:  S/p reverse total shoulder arthroplasty [Z96.619] Patient Active Problem List   Diagnosis Date Noted  . S/p reverse total shoulder arthroplasty 02/15/2020  . Intraductal papilloma of breast, left 06/19/2017  . OA (osteoarthritis) of knee 01/25/2014  . Popstop Acute blood loss anemia 02/26/2012  . Postop Hyponatremia 02/26/2012  . OA (osteoarthritis) of hip 02/25/2012   PCP:  Maryland Pink, MD Pharmacy:   [facility] [address] [place] - [place] [address] [place] [address] Phone: [phone] Fax: [phone]     Social Determinants of Health (SDOH) Interventions    Readmission Risk Interventions No flowsheet data found.

## 2020-02-16 NOTE — Discharge Summary (Signed)
Physician Discharge Summary  Subjective: 1 Day Post-Op Procedure(s) (LRB): Right reverse shoulder arthroplasty, biceps tenodesis (Right) Patient reports pain as mild.   Patient seen in rounds with Dr. Posey Pronto. Patient is well, but has had some minor complaints of Dizziness with ambulation Patient is ready to go home  Physician Discharge Summary  Patient ID: Charlotte Burns MRN: [id number] DOB/AGE: [birth year] 74 y.o.  Admit date: 02/15/2020 Discharge date: 02/16/2020  Admission Diagnoses:  Discharge Diagnoses:  Active Problems:   S/p reverse total shoulder arthroplasty   Discharged Condition: fair  Hospital Course: The patient is postop day 1 from a right reverse total shoulder replacement.  She did physical therapy on the day of surgery and became dizzy and had elevated blood pressure with ambulation.  They held her overnight.  She is improving.  She is ready to go home after physical therapy this morning.  Treatments: surgery:  1. Right reverse total shoulder arthroplasty 2. Right biceps tenodesis  SURGEON: Cato Mulligan, MD  ASSISTANTS: Reche Dixon, PA  ANESTHESIA: Gen + interscalene block  ESTIMATED BLOOD LOSS: 300cc  TOTAL IV FLUIDS: per anesthesia record  IMPLANTS: DJO Surgical: RSP Glenoid Head w/Retaining screw 32-4; Monoblock Reverse Shoulder Baseplate with 2.6VZ central screw; 3 locking screws into baseplate (superior, posterior, inferior); Small Shell Short Humeral Stem 12 x 64mm; Neutral Small Socket Insert;   Discharge Exam: Blood pressure (!) 146/77, pulse 100, temperature 98.4 F (36.9 C), temperature source Oral, resp. rate 19, height 5\' 7"  (1.702 m), weight 89.4 kg, SpO2 92 %.   Disposition: Discharge disposition: 01-Home or Self Care       Discharge Instructions    Call MD / Call 911   Complete by: As directed    If you experience chest pain or shortness of breath, CALL 911 and be transported to the hospital emergency room.  If you  develope a fever above 101 F, pus (white drainage) or increased drainage or redness at the wound, or calf pain, call your surgeon's office.   Constipation Prevention   Complete by: As directed    Drink plenty of fluids.  Prune juice may be helpful.  You may use a stool softener, such as Colace (over the counter) 100 mg twice a day.  Use MiraLax (over the counter) for constipation as needed.   Diet - low sodium heart healthy   Complete by: As directed    Increase activity slowly as tolerated   Complete by: As directed      Allergies as of 02/16/2020   No Known Allergies     Medication List    STOP taking these medications   diclofenac 75 MG EC tablet Commonly known as: VOLTAREN     TAKE these medications   aspirin EC 325 MG tablet Take 1 tablet (325 mg total) by mouth daily. What changed:   medication strength  how much to take  when to take this  additional instructions   gabapentin 300 MG capsule Commonly known as: NEURONTIN Take 600 mg by mouth at bedtime.   metFORMIN 500 MG 24 hr tablet Commonly known as: GLUCOPHAGE-XR Take 1,000 mg by mouth every evening.   omeprazole 20 MG capsule Commonly known as: PRILOSEC Take 20 mg by mouth daily. prn   ondansetron 4 MG disintegrating tablet Commonly known as: Zofran ODT Take 1 tablet (4 mg total) by mouth every 8 (eight) hours as needed for nausea or vomiting.   oxyCODONE 5 MG immediate release tablet Commonly known as: Roxicodone Take  1-2 tablets (5-10 mg total) by mouth every 4 (four) hours as needed (pain).   prednisoLONE acetate 1 % ophthalmic suspension Commonly known as: PRED FORTE Place 1 drop into the left eye every other day.   traMADol 50 MG tablet Commonly known as: ULTRAM Take 50-100 mg by mouth every 8 (eight) hours as needed (pain).   TYLENOL 500 MG tablet Generic drug: acetaminophen Take 1,000 mg by mouth every 6 (six) hours as needed. What changed: Another medication with the same name was  added. Make sure you understand how and when to take each.   acetaminophen 500 MG tablet Commonly known as: TYLENOL Take 2 tablets (1,000 mg total) by mouth every 8 (eight) hours. What changed: You were already taking a medication with the same name, and this prescription was added. Make sure you understand how and when to take each.       Follow-up Information    Leim Fabry, MD. Go in 2 week(s).   Specialty: Orthopedic Surgery Contact information: [place] [phone]               Signed: Reche Dixon 02/16/2020, 7:11 AM   Objective: Vital signs in last 24 hours: Temp:  [96.8 F (36 C)-98.5 F (36.9 C)] 98.4 F (36.9 C) (09/13 2300) Pulse Rate:  [67-105] 100 (09/13 2300) Resp:  [16-20] 19 (09/13 2300) BP: (110-180)/(58-87) 146/77 (09/13 2300) SpO2:  [92 %-100 %] 92 % (09/13 2300)  Intake/Output from previous day:  Intake/Output Summary (Last 24 hours) at 02/16/2020 0711 Last data filed at 02/16/2020 0618 Gross per 24 hour  Intake 1350 ml  Output 637 ml  Net 713 ml    Intake/Output this shift: No intake/output data recorded.  Labs: Recent Labs    02/16/20 0414  HGB 11.8*   Recent Labs    02/16/20 0414  WBC 7.3  RBC 3.53*  HCT 32.8*  PLT 178   Recent Labs    02/16/20 0414  NA 136  K 3.9  CL 106  CO2 22  BUN 12  CREATININE 0.75  GLUCOSE 209*  CALCIUM 10.3   No results for input(s): LABPT, INR in the last 72 hours.  EXAM: General - Patient is Alert and Oriented Extremity -neurovascular intact.  Sensation intact. Grip strength intact.  Able to give posterior pressure with her elbow. Incision - clean, dry, no drainage Motor Function -flexion and extension of the fingers and wrist intact.  Shoulder in the shoulder immobilizer.  Assessment/Plan: 1 Day Post-Op Procedure(s) (LRB): Right reverse shoulder arthroplasty, biceps tenodesis (Right) Procedure(s) (LRB): Right reverse shoulder arthroplasty,  biceps tenodesis (Right) Past Medical History:  Diagnosis Date  . Arthritis   . Chest tightness    ECHO AND STRESS   . Diabetes mellitus without complication ([place])   . DJD (degenerative joint disease)   . GERD (gastroesophageal reflux disease)   . H/O hypercholesterolemia    told one time  . History of shingles    left side of face jan 2015  . Pneumonia    hx of  . Shortness of breath    occasional, usually with activity   Active Problems:   S/p reverse total shoulder arthroplasty  Estimated body mass index is 30.85 kg/m as calculated from the following:   Height as of this encounter: 5\' 7"  (1.702 m).   Weight as of this encounter: 89.4 kg. Advance diet Up with therapy D/C IV fluids  DC home after physical therapy Diet - Regular  diet Follow up - in 2 weeks Activity -stay in the shoulder immobilizer at all times unless doing physical therapy. Disposition - Home Condition Upon Discharge - Stable DVT Prophylaxis - Aspirin  Reche Dixon, PA-C Orthopaedic Surgery 02/16/2020, 7:11 AM

## 2020-02-16 NOTE — Progress Notes (Signed)
Physical Therapy Treatment Patient Details Name: Charlotte Burns MRN: [id number] DOB: 03/23/1946 Today's Date: 02/16/2020    History of Present Illness Pt is a 74 y.o. female s/p R reverse total shoulder arthroplasty and R biceps tenodesis 9/13.  Pt with interscalene brachial plexus block.  PMH includes DM, anemia, chest tightness, h/o shingles, PNA, SOB occasional (usually with activity).    PT Comments    Pt resting in bed eating breakfast upon PT arrival; agreeable to PT session.  CGA to SBA with bed mobility, transfers, and navigating 4 steps with L UE support on railing; CGA ambulating 100 feet x2 (no AD).  During session, pt reporting R sided chest pain (3/10).  Pt reporting h/o this and that it was musculoskeletal (used cream on it to make it better).  With further questioning, pt reporting that this pain would come and go prior to surgery and sometimes would start with activity and other times started without any exertional activity.  Pt assisted back to bed and nurse notified who came to assess pt.  Pt's BP 141/77 with HR 95 bpm and O2 sats 94% on room air.  Session ended.  In terms of functional mobility, pt appears safe to discharge home with support of family.   Follow Up Recommendations  Outpatient PT     Equipment Recommendations  None recommended by PT    Recommendations for Other Services OT consult     Precautions / Restrictions Precautions Precautions: Fall;Shoulder Shoulder Interventions: Shoulder sling/immobilizer;At all times;Off for dressing/bathing/exercises Precaution Booklet Issued: Yes (comment) Restrictions Weight Bearing Restrictions: Yes RUE Weight Bearing: Non weight bearing    Mobility  Bed Mobility Overal bed mobility: Needs Assistance Bed Mobility: Supine to Sit;Sit to Supine     Supine to sit: Min guard Sit to supine: Supervision   General bed mobility comments: bed flat; increased effort to perform supine to sitting edge of bed on pt's  own (towards L side of bed)  Transfers Overall transfer level: Needs assistance Equipment used: None Transfers: Sit to/from Stand Sit to Stand: Min guard;Supervision         General transfer comment: x1 trial from bed and x1 trial from mat table; steady  Ambulation/Gait Ambulation/Gait assistance: Min guard Gait Distance (Feet):  (100 feet x2) Assistive device: None   Gait velocity: decreased   General Gait Details: mild increased BOS with mild increased B lateral sway but steady (no loss of balance noted)   Stairs Stairs: Yes Stairs assistance: Min guard;Supervision Stair Management: Step to pattern;Forwards (L UE on L sided rail ascending and L UE on L sided rail descending) Number of Stairs: 4 General stair comments: steady; pt appearing extra careful descending steps   Wheelchair Mobility    Modified Rankin (Stroke Patients Only)       Balance Overall balance assessment: Needs assistance Sitting-balance support: No upper extremity supported;Feet supported Sitting balance-Leahy Scale: Good Sitting balance - Comments: steady sitting reaching within BOS with L UE   Standing balance support: No upper extremity supported;During functional activity Standing balance-Leahy Scale: Good Standing balance comment: no loss of balance with functional mobility                            Cognition Arousal/Alertness: Awake/alert Behavior During Therapy: WFL for tasks assessed/performed Overall Cognitive Status: Within Functional Limits for tasks assessed  General Comments: R UE sling in place      Exercises      General Comments General comments (skin integrity, edema, etc.): R UE sling in place.  Nursing cleared pt for participation in physical therapy.  Pt agreeable to PT session.      Pertinent Vitals/Pain Pain Assessment: 0-10 Pain Score: 2  Pain Location: R chest pain Pain Descriptors / Indicators:  Constant Pain Intervention(s): Limited activity within patient's tolerance;Monitored during session;Repositioned (RN notified and immediately came to assess)    Home Living                      Prior Function            PT Goals (current goals can now be found in the care plan section) Acute Rehab PT Goals Patient Stated Goal: go home PT Goal Formulation: With patient Time For Goal Achievement: 02/29/20 Potential to Achieve Goals: Good Progress towards PT goals: Progressing toward goals    Frequency    BID      PT Plan Current plan remains appropriate    Co-evaluation              AM-PAC PT "6 Clicks" Mobility   Outcome Measure  Help needed turning from your back to your side while in a flat bed without using bedrails?: A Little Help needed moving from lying on your back to sitting on the side of a flat bed without using bedrails?: A Little Help needed moving to and from a bed to a chair (including a wheelchair)?: A Little Help needed standing up from a chair using your arms (e.g., wheelchair or bedside chair)?: A Little Help needed to walk in hospital room?: A Little Help needed climbing 3-5 steps with a railing? : A Little 6 Click Score: 18    End of Session Equipment Utilized During Treatment: Gait belt Activity Tolerance: Other (comment) (limited d/t c/o chest pain during session) Patient left: in bed;with call bell/phone within reach;with bed alarm set;with nursing/sitter in room Nurse Communication: Mobility status;Precautions;Weight bearing status;Other (comment) (pt's chest pain) PT Visit Diagnosis: Unsteadiness on feet (R26.81);Other abnormalities of gait and mobility (R26.89);Muscle weakness (generalized) (M62.81);History of falling (Z91.81);Difficulty in walking, not elsewhere classified (R26.2);Dizziness and giddiness (R42);Pain Pain - Right/Left: Right Pain - part of body: Shoulder     Time: 3235-5732 PT Time Calculation (min) (ACUTE  ONLY): 26 min  Charges:  $Gait Training: 8-22 mins $Therapeutic Activity: 8-22 mins                     Leitha Bleak, PT 02/16/20, 10:04 AM

## 2020-02-16 NOTE — Progress Notes (Signed)
Occupational Therapy Treatment Patient Details Name: Charlotte Burns MRN: [id number] DOB: 09-04-1945 Today's Date: 02/16/2020    History of present illness Pt is a 74 y.o. female s/p R reverse total shoulder arthroplasty and R biceps tenodesis 9/13.  Pt with interscalene brachial plexus block.  PMH includes DM, anemia, chest tightness, h/o shingles, PNA, SOB occasional (usually with activity).   OT comments  RN cleared pt to work with OT. Pt agreeable; much more alert than initial evaluation previous date. Pt denies pain. Further instruction provided to pt for falls prevention, shoulder discharge instructions, sling mgt, polar care mgt, compression stocking mgt, hemi techniques for UB dressing; and AE/DME for ADL tasks such as a seated shower, toileting pericare, and LB bathing/dressing. Pt verbalized understanding, asked appropriate questions regarding home routines, and was actively engaged in session. Pt continues to benefit from skilled OT Services while hospitalized. Current care plan remains appropriate.    Follow Up Recommendations  Follow surgeon's recommendation for DC plan and follow-up therapies;Supervision - Intermittent    Equipment Recommendations  Other (comment) (toileting aide, LH sponge, reacher)    Recommendations for Other Services      Precautions / Restrictions Precautions Precautions: Fall;Shoulder Shoulder Interventions: Shoulder sling/immobilizer;At all times;Off for dressing/bathing/exercises Precaution Booklet Issued: Yes (comment) Restrictions Weight Bearing Restrictions: Yes RUE Weight Bearing: Non weight bearing       Mobility Bed Mobility Overal bed mobility: Needs Assistance Bed Mobility: Supine to Sit;Sit to Supine     Supine to sit: Min guard Sit to supine: Supervision   General bed mobility comments: bed flat; increased effort to perform supine to sitting edge of bed on pt's own (towards L side of bed)  Transfers Overall transfer level:  Needs assistance Equipment used: None Transfers: Sit to/from Stand Sit to Stand: Min guard;Supervision         General transfer comment: x1 trial from bed and x1 trial from mat table; steady    Balance Overall balance assessment: Needs assistance Sitting-balance support: No upper extremity supported;Feet supported Sitting balance-Leahy Scale: Good Sitting balance - Comments: steady sitting reaching within BOS with L UE   Standing balance support: No upper extremity supported;During functional activity Standing balance-Leahy Scale: Good Standing balance comment: no loss of balance with functional mobility                           ADL either performed or assessed with clinical judgement   ADL Overall ADL's : Needs assistance/impaired                                       General ADL Comments: Pt continues to require Min A for LB ADL, Min-mod A for UB ADL tasks, CGA for ADL transfers; Min A for pericare thoroughness due to pt needing to use non-dom LUE for hygiene; pt/son report family able to provide needed level of assist     Vision Patient Visual Report: No change from baseline     Perception     Praxis      Cognition Arousal/Alertness: Awake/alert Behavior During Therapy: WFL for tasks assessed/performed Overall Cognitive Status: Within Functional Limits for tasks assessed                                 General Comments: R UE sling in place  Exercises Other Exercises Other Exercises: Further instruction provided to pt for falls prevention, shoulder discharge instructions, sling mgt, polar care mgt, compression stocking mgt, hemi techniques for UB dressing; and AE/DME for ADL tasks such as a seated shower, toileting pericare, and LB bathing/dressing.   Shoulder Instructions       General Comments R UE sling in place    Pertinent Vitals/ Pain       Pain Assessment: No/denies pain Pain Score: 2  Pain Location:  R chest pain Pain Descriptors / Indicators: Constant Pain Intervention(s): Limited activity within patient's tolerance;Monitored during session;Repositioned (RN notified and immediately came to assess)  Home Living                                          Prior Functioning/Environment              Frequency  Min 1X/week        Progress Toward Goals  OT Goals(current goals can now be found in the care plan section)  Progress towards OT goals: Progressing toward goals  Acute Rehab OT Goals Patient Stated Goal: go home OT Goal Formulation: With patient/family Time For Goal Achievement: 02/29/20 Potential to Achieve Goals: Good  Plan Discharge plan remains appropriate;Frequency remains appropriate    Co-evaluation                 AM-PAC OT "6 Clicks" Daily Activity     Outcome Measure   Help from another person eating meals?: A Little Help from another person taking care of personal grooming?: A Little Help from another person toileting, which includes using toliet, bedpan, or urinal?: A Little Help from another person bathing (including washing, rinsing, drying)?: A Little Help from another person to put on and taking off regular upper body clothing?: A Lot Help from another person to put on and taking off regular lower body clothing?: A Little 6 Click Score: 17    End of Session    OT Visit Diagnosis: Other abnormalities of gait and mobility (R26.89);Muscle weakness (generalized) (M62.81);History of falling (Z91.81)   Activity Tolerance Patient tolerated treatment well   Patient Left in bed;with call bell/phone within reach;with bed alarm set;Other (comment) (polar care and sling in place, compression stockings donned)   Nurse Communication          Time: 4166-0630 OT Time Calculation (min): 24 min  Charges: OT General Charges $OT Visit: 1 Visit OT Treatments $Self Care/Home Management : 23-37 mins  Jeni Salles, MPH, MS,  OTR/L ascom [phone] 02/16/20, 11:56 AM

## 2020-02-16 NOTE — Progress Notes (Signed)
°  Subjective: 1 Day Post-Op Procedure(s) (LRB): Right reverse shoulder arthroplasty, biceps tenodesis (Right) Patient reports pain as mild.   Patient is well, and has had no acute complaints or problems Plan is to go Home after hospital stay. Negative for chest pain and shortness of breath Fever: no Gastrointestinal: Negative for nausea and vomiting  Objective: Vital signs in last 24 hours: Temp:  [96.8 F (36 C)-98.5 F (36.9 C)] 98.4 F (36.9 C) (09/13 2300) Pulse Rate:  [67-105] 100 (09/13 2300) Resp:  [16-20] 19 (09/13 2300) BP: (110-180)/(58-87) 146/77 (09/13 2300) SpO2:  [92 %-100 %] 92 % (09/13 2300)  Intake/Output from previous day:  Intake/Output Summary (Last 24 hours) at 02/16/2020 0707 Last data filed at 02/16/2020 0618 Gross per 24 hour  Intake 1350 ml  Output 637 ml  Net 713 ml    Intake/Output this shift: No intake/output data recorded.  Labs: Recent Labs    02/16/20 0414  HGB 11.8*   Recent Labs    02/16/20 0414  WBC 7.3  RBC 3.53*  HCT 32.8*  PLT 178   Recent Labs    02/16/20 0414  NA 136  K 3.9  CL 106  CO2 22  BUN 12  CREATININE 0.75  GLUCOSE 209*  CALCIUM 10.3   No results for input(s): LABPT, INR in the last 72 hours.   EXAM General - Patient is Alert and Oriented Extremity - Neurovascular intact Sensation intact distally Dorsiflexion/Plantar flexion intact Grip strength intact.  Able to give posterior pressure with her elbow. Dressing/Incision - clean, dry, no drainage Motor Function - intact, moving fingers and wrist well on exam.  Remaining in shoulder immobilizer.  Ambulated with physical therapy yesterday but had elevated blood pressure.  Will attempt today.  Past Medical History:  Diagnosis Date   Arthritis    Chest tightness    ECHO AND STRESS    Diabetes mellitus without complication (HCC)    DJD (degenerative joint disease)    GERD (gastroesophageal reflux disease)    H/O hypercholesterolemia    told one  time   History of shingles    left side of face jan 2015   Pneumonia    hx of   Shortness of breath    occasional, usually with activity    Assessment/Plan: 1 Day Post-Op Procedure(s) (LRB): Right reverse shoulder arthroplasty, biceps tenodesis (Right) Active Problems:   S/p reverse total shoulder arthroplasty  Estimated body mass index is 30.85 kg/m as calculated from the following:   Height as of this encounter: 5\' 7"  (1.702 m).   Weight as of this encounter: 89.4 kg. Advance diet Up with therapy D/C IV fluids  Plan to discharge home today after physical therapy.  Depending on stabilized blood pressure.  DVT Prophylaxis - Aspirin Shoulder immobilizer to right arm.  Reche Dixon, PA-C Orthopaedic Surgery 02/16/2020, 7:07 AM

## 2020-02-19 DIAGNOSIS — M25511 Pain in right shoulder: Secondary | ICD-10-CM | POA: Diagnosis not present

## 2020-02-19 DIAGNOSIS — M25611 Stiffness of right shoulder, not elsewhere classified: Secondary | ICD-10-CM | POA: Diagnosis not present

## 2020-02-19 DIAGNOSIS — Z96611 Presence of right artificial shoulder joint: Secondary | ICD-10-CM | POA: Diagnosis not present

## 2020-02-19 DIAGNOSIS — R29898 Other symptoms and signs involving the musculoskeletal system: Secondary | ICD-10-CM | POA: Diagnosis not present

## 2020-02-24 DIAGNOSIS — M25511 Pain in right shoulder: Secondary | ICD-10-CM | POA: Diagnosis not present

## 2020-02-24 DIAGNOSIS — Z96611 Presence of right artificial shoulder joint: Secondary | ICD-10-CM | POA: Diagnosis not present

## 2020-03-02 DIAGNOSIS — M75121 Complete rotator cuff tear or rupture of right shoulder, not specified as traumatic: Secondary | ICD-10-CM | POA: Diagnosis not present

## 2020-03-02 DIAGNOSIS — Z96611 Presence of right artificial shoulder joint: Secondary | ICD-10-CM | POA: Diagnosis not present

## 2020-03-08 DIAGNOSIS — Z96611 Presence of right artificial shoulder joint: Secondary | ICD-10-CM | POA: Diagnosis not present

## 2020-03-08 DIAGNOSIS — M25511 Pain in right shoulder: Secondary | ICD-10-CM | POA: Diagnosis not present

## 2020-03-15 DIAGNOSIS — M25511 Pain in right shoulder: Secondary | ICD-10-CM | POA: Diagnosis not present

## 2020-03-15 DIAGNOSIS — Z96611 Presence of right artificial shoulder joint: Secondary | ICD-10-CM | POA: Diagnosis not present

## 2020-03-22 DIAGNOSIS — Z96611 Presence of right artificial shoulder joint: Secondary | ICD-10-CM | POA: Diagnosis not present

## 2020-03-30 DIAGNOSIS — M75121 Complete rotator cuff tear or rupture of right shoulder, not specified as traumatic: Secondary | ICD-10-CM | POA: Diagnosis not present

## 2020-03-31 DIAGNOSIS — M25511 Pain in right shoulder: Secondary | ICD-10-CM | POA: Diagnosis not present

## 2020-03-31 DIAGNOSIS — Z96611 Presence of right artificial shoulder joint: Secondary | ICD-10-CM | POA: Diagnosis not present

## 2020-04-05 DIAGNOSIS — M25511 Pain in right shoulder: Secondary | ICD-10-CM | POA: Diagnosis not present

## 2020-04-05 DIAGNOSIS — Z96611 Presence of right artificial shoulder joint: Secondary | ICD-10-CM | POA: Diagnosis not present

## 2020-04-13 DIAGNOSIS — Z96611 Presence of right artificial shoulder joint: Secondary | ICD-10-CM | POA: Diagnosis not present

## 2020-04-19 DIAGNOSIS — Z96611 Presence of right artificial shoulder joint: Secondary | ICD-10-CM | POA: Diagnosis not present

## 2020-04-19 DIAGNOSIS — M25511 Pain in right shoulder: Secondary | ICD-10-CM | POA: Diagnosis not present

## 2020-05-03 DIAGNOSIS — M25511 Pain in right shoulder: Secondary | ICD-10-CM | POA: Diagnosis not present

## 2020-05-03 DIAGNOSIS — Z96611 Presence of right artificial shoulder joint: Secondary | ICD-10-CM | POA: Diagnosis not present

## 2020-05-06 DIAGNOSIS — Z1152 Encounter for screening for COVID-19: Secondary | ICD-10-CM | POA: Diagnosis not present

## 2020-05-06 DIAGNOSIS — Z03818 Encounter for observation for suspected exposure to other biological agents ruled out: Secondary | ICD-10-CM | POA: Diagnosis not present

## 2020-05-12 DIAGNOSIS — Z20822 Contact with and (suspected) exposure to covid-19: Secondary | ICD-10-CM | POA: Diagnosis not present

## 2020-06-08 DIAGNOSIS — M75121 Complete rotator cuff tear or rupture of right shoulder, not specified as traumatic: Secondary | ICD-10-CM | POA: Diagnosis not present

## 2020-06-15 DIAGNOSIS — R29898 Other symptoms and signs involving the musculoskeletal system: Secondary | ICD-10-CM | POA: Diagnosis not present

## 2020-06-15 DIAGNOSIS — Z96611 Presence of right artificial shoulder joint: Secondary | ICD-10-CM | POA: Diagnosis not present

## 2020-06-15 DIAGNOSIS — M25511 Pain in right shoulder: Secondary | ICD-10-CM | POA: Diagnosis not present

## 2020-06-15 DIAGNOSIS — M25611 Stiffness of right shoulder, not elsewhere classified: Secondary | ICD-10-CM | POA: Diagnosis not present

## 2020-06-22 DIAGNOSIS — M25511 Pain in right shoulder: Secondary | ICD-10-CM | POA: Diagnosis not present

## 2020-06-22 DIAGNOSIS — Z96611 Presence of right artificial shoulder joint: Secondary | ICD-10-CM | POA: Diagnosis not present

## 2020-06-29 ENCOUNTER — Other Ambulatory Visit: Payer: Self-pay | Admitting: Physical Medicine & Rehabilitation

## 2020-06-29 DIAGNOSIS — M546 Pain in thoracic spine: Secondary | ICD-10-CM | POA: Diagnosis not present

## 2020-06-29 DIAGNOSIS — M5414 Radiculopathy, thoracic region: Secondary | ICD-10-CM | POA: Diagnosis not present

## 2020-06-29 DIAGNOSIS — M5412 Radiculopathy, cervical region: Secondary | ICD-10-CM

## 2020-06-29 DIAGNOSIS — M542 Cervicalgia: Secondary | ICD-10-CM | POA: Diagnosis not present

## 2020-07-05 DIAGNOSIS — M25511 Pain in right shoulder: Secondary | ICD-10-CM | POA: Diagnosis not present

## 2020-07-05 DIAGNOSIS — Z96611 Presence of right artificial shoulder joint: Secondary | ICD-10-CM | POA: Diagnosis not present

## 2020-07-06 ENCOUNTER — Ambulatory Visit: Admission: RE | Admit: 2020-07-06 | Payer: PPO | Source: Ambulatory Visit

## 2020-07-19 ENCOUNTER — Ambulatory Visit
Admission: RE | Admit: 2020-07-19 | Discharge: 2020-07-19 | Disposition: A | Discharge status: Home / Self Care | Payer: PPO | Source: Ambulatory Visit | Attending: Physical Medicine & Rehabilitation | Admitting: Physical Medicine & Rehabilitation

## 2020-07-19 ENCOUNTER — Other Ambulatory Visit: Payer: Self-pay

## 2020-07-19 DIAGNOSIS — M50221 Other cervical disc displacement at C4-C5 level: Secondary | ICD-10-CM | POA: Diagnosis not present

## 2020-07-19 DIAGNOSIS — M5124 Other intervertebral disc displacement, thoracic region: Secondary | ICD-10-CM | POA: Diagnosis not present

## 2020-07-19 DIAGNOSIS — M5021 Other cervical disc displacement,  high cervical region: Secondary | ICD-10-CM | POA: Diagnosis not present

## 2020-07-19 DIAGNOSIS — M5414 Radiculopathy, thoracic region: Secondary | ICD-10-CM | POA: Diagnosis not present

## 2020-07-19 DIAGNOSIS — M5412 Radiculopathy, cervical region: Secondary | ICD-10-CM | POA: Insufficient documentation

## 2020-07-19 DIAGNOSIS — M25511 Pain in right shoulder: Secondary | ICD-10-CM | POA: Diagnosis not present

## 2020-07-20 DIAGNOSIS — M4802 Spinal stenosis, cervical region: Secondary | ICD-10-CM | POA: Diagnosis not present

## 2020-07-20 DIAGNOSIS — M542 Cervicalgia: Secondary | ICD-10-CM | POA: Diagnosis not present

## 2020-07-20 DIAGNOSIS — M5414 Radiculopathy, thoracic region: Secondary | ICD-10-CM | POA: Diagnosis not present

## 2020-07-20 DIAGNOSIS — M5412 Radiculopathy, cervical region: Secondary | ICD-10-CM | POA: Diagnosis not present

## 2020-07-20 DIAGNOSIS — M546 Pain in thoracic spine: Secondary | ICD-10-CM | POA: Diagnosis not present

## 2020-07-21 DIAGNOSIS — E785 Hyperlipidemia, unspecified: Secondary | ICD-10-CM | POA: Diagnosis not present

## 2020-07-21 DIAGNOSIS — M5412 Radiculopathy, cervical region: Secondary | ICD-10-CM | POA: Diagnosis not present

## 2020-07-21 DIAGNOSIS — E1142 Type 2 diabetes mellitus with diabetic polyneuropathy: Secondary | ICD-10-CM | POA: Diagnosis not present

## 2020-07-21 DIAGNOSIS — M542 Cervicalgia: Secondary | ICD-10-CM | POA: Diagnosis not present

## 2020-07-21 DIAGNOSIS — M4802 Spinal stenosis, cervical region: Secondary | ICD-10-CM | POA: Diagnosis not present

## 2020-07-22 DIAGNOSIS — R7309 Other abnormal glucose: Secondary | ICD-10-CM | POA: Diagnosis not present

## 2020-08-03 DIAGNOSIS — M5412 Radiculopathy, cervical region: Secondary | ICD-10-CM | POA: Diagnosis not present

## 2020-08-03 DIAGNOSIS — M5414 Radiculopathy, thoracic region: Secondary | ICD-10-CM | POA: Diagnosis not present

## 2020-08-03 DIAGNOSIS — M542 Cervicalgia: Secondary | ICD-10-CM | POA: Diagnosis not present

## 2020-08-03 DIAGNOSIS — M546 Pain in thoracic spine: Secondary | ICD-10-CM | POA: Diagnosis not present

## 2020-08-03 DIAGNOSIS — M4802 Spinal stenosis, cervical region: Secondary | ICD-10-CM | POA: Diagnosis not present

## 2020-08-05 DIAGNOSIS — M5414 Radiculopathy, thoracic region: Secondary | ICD-10-CM | POA: Diagnosis not present

## 2020-08-05 DIAGNOSIS — M546 Pain in thoracic spine: Secondary | ICD-10-CM | POA: Diagnosis not present

## 2020-08-05 DIAGNOSIS — E1142 Type 2 diabetes mellitus with diabetic polyneuropathy: Secondary | ICD-10-CM | POA: Diagnosis not present

## 2020-08-23 DIAGNOSIS — M5414 Radiculopathy, thoracic region: Secondary | ICD-10-CM | POA: Diagnosis not present

## 2020-08-23 DIAGNOSIS — M5412 Radiculopathy, cervical region: Secondary | ICD-10-CM | POA: Diagnosis not present

## 2020-08-23 DIAGNOSIS — M546 Pain in thoracic spine: Secondary | ICD-10-CM | POA: Diagnosis not present

## 2020-08-23 DIAGNOSIS — M542 Cervicalgia: Secondary | ICD-10-CM | POA: Diagnosis not present

## 2020-08-23 DIAGNOSIS — M4802 Spinal stenosis, cervical region: Secondary | ICD-10-CM | POA: Diagnosis not present

## 2020-09-06 ENCOUNTER — Other Ambulatory Visit (HOSPITAL_COMMUNITY): Payer: Self-pay | Admitting: Physical Medicine & Rehabilitation

## 2020-09-06 ENCOUNTER — Other Ambulatory Visit: Payer: Self-pay | Admitting: Physical Medicine & Rehabilitation

## 2020-09-06 DIAGNOSIS — M9931 Osseous stenosis of neural canal of cervical region: Secondary | ICD-10-CM | POA: Diagnosis not present

## 2020-09-06 DIAGNOSIS — M549 Dorsalgia, unspecified: Secondary | ICD-10-CM

## 2020-09-06 DIAGNOSIS — M5412 Radiculopathy, cervical region: Secondary | ICD-10-CM | POA: Diagnosis not present

## 2020-09-06 DIAGNOSIS — M5442 Lumbago with sciatica, left side: Secondary | ICD-10-CM

## 2020-09-06 DIAGNOSIS — M5114 Intervertebral disc disorders with radiculopathy, thoracic region: Secondary | ICD-10-CM | POA: Diagnosis not present

## 2020-09-06 DIAGNOSIS — M542 Cervicalgia: Secondary | ICD-10-CM | POA: Diagnosis not present

## 2020-09-06 DIAGNOSIS — M546 Pain in thoracic spine: Secondary | ICD-10-CM | POA: Diagnosis not present

## 2020-09-06 DIAGNOSIS — G8929 Other chronic pain: Secondary | ICD-10-CM

## 2020-09-07 DIAGNOSIS — M75121 Complete rotator cuff tear or rupture of right shoulder, not specified as traumatic: Secondary | ICD-10-CM | POA: Diagnosis not present

## 2020-09-16 ENCOUNTER — Ambulatory Visit
Admission: RE | Admit: 2020-09-16 | Discharge: 2020-09-16 | Disposition: A | Discharge status: Home / Self Care | Payer: PPO | Source: Ambulatory Visit | Attending: Physical Medicine & Rehabilitation | Admitting: Physical Medicine & Rehabilitation

## 2020-09-16 ENCOUNTER — Other Ambulatory Visit: Payer: Self-pay

## 2020-09-16 DIAGNOSIS — M545 Low back pain, unspecified: Secondary | ICD-10-CM | POA: Diagnosis not present

## 2020-09-16 DIAGNOSIS — M5442 Lumbago with sciatica, left side: Secondary | ICD-10-CM | POA: Diagnosis not present

## 2020-09-16 DIAGNOSIS — G8929 Other chronic pain: Secondary | ICD-10-CM | POA: Diagnosis not present

## 2020-09-20 DIAGNOSIS — M5442 Lumbago with sciatica, left side: Secondary | ICD-10-CM | POA: Diagnosis not present

## 2020-09-20 DIAGNOSIS — M9931 Osseous stenosis of neural canal of cervical region: Secondary | ICD-10-CM | POA: Diagnosis not present

## 2020-09-20 DIAGNOSIS — M5412 Radiculopathy, cervical region: Secondary | ICD-10-CM | POA: Diagnosis not present

## 2020-09-20 DIAGNOSIS — M546 Pain in thoracic spine: Secondary | ICD-10-CM | POA: Diagnosis not present

## 2020-09-20 DIAGNOSIS — G8929 Other chronic pain: Secondary | ICD-10-CM | POA: Diagnosis not present

## 2020-09-20 DIAGNOSIS — M542 Cervicalgia: Secondary | ICD-10-CM | POA: Diagnosis not present

## 2020-09-20 DIAGNOSIS — M48061 Spinal stenosis, lumbar region without neurogenic claudication: Secondary | ICD-10-CM | POA: Diagnosis not present

## 2020-09-20 DIAGNOSIS — M5414 Radiculopathy, thoracic region: Secondary | ICD-10-CM | POA: Diagnosis not present

## 2020-09-22 DIAGNOSIS — M48061 Spinal stenosis, lumbar region without neurogenic claudication: Secondary | ICD-10-CM | POA: Diagnosis not present

## 2020-09-22 DIAGNOSIS — M5442 Lumbago with sciatica, left side: Secondary | ICD-10-CM | POA: Diagnosis not present

## 2020-09-22 DIAGNOSIS — G8929 Other chronic pain: Secondary | ICD-10-CM | POA: Diagnosis not present

## 2020-09-22 DIAGNOSIS — E1142 Type 2 diabetes mellitus with diabetic polyneuropathy: Secondary | ICD-10-CM | POA: Diagnosis not present

## 2020-10-05 DIAGNOSIS — M9931 Osseous stenosis of neural canal of cervical region: Secondary | ICD-10-CM | POA: Diagnosis not present

## 2020-10-05 DIAGNOSIS — M5442 Lumbago with sciatica, left side: Secondary | ICD-10-CM | POA: Diagnosis not present

## 2020-10-05 DIAGNOSIS — M546 Pain in thoracic spine: Secondary | ICD-10-CM | POA: Diagnosis not present

## 2020-10-05 DIAGNOSIS — G8929 Other chronic pain: Secondary | ICD-10-CM | POA: Diagnosis not present

## 2020-10-05 DIAGNOSIS — M5412 Radiculopathy, cervical region: Secondary | ICD-10-CM | POA: Diagnosis not present

## 2020-10-05 DIAGNOSIS — M542 Cervicalgia: Secondary | ICD-10-CM | POA: Diagnosis not present

## 2020-10-05 DIAGNOSIS — M48061 Spinal stenosis, lumbar region without neurogenic claudication: Secondary | ICD-10-CM | POA: Diagnosis not present

## 2020-10-12 DIAGNOSIS — E1142 Type 2 diabetes mellitus with diabetic polyneuropathy: Secondary | ICD-10-CM | POA: Diagnosis not present

## 2020-10-12 DIAGNOSIS — E785 Hyperlipidemia, unspecified: Secondary | ICD-10-CM | POA: Diagnosis not present

## 2020-10-12 DIAGNOSIS — I10 Essential (primary) hypertension: Secondary | ICD-10-CM | POA: Diagnosis not present

## 2020-10-19 DIAGNOSIS — Z0000 Encounter for general adult medical examination without abnormal findings: Secondary | ICD-10-CM | POA: Diagnosis not present

## 2020-10-19 DIAGNOSIS — R748 Abnormal levels of other serum enzymes: Secondary | ICD-10-CM | POA: Diagnosis not present

## 2020-10-19 DIAGNOSIS — E1140 Type 2 diabetes mellitus with diabetic neuropathy, unspecified: Secondary | ICD-10-CM | POA: Diagnosis not present

## 2020-10-19 DIAGNOSIS — R413 Other amnesia: Secondary | ICD-10-CM | POA: Diagnosis not present

## 2020-10-19 DIAGNOSIS — M545 Low back pain, unspecified: Secondary | ICD-10-CM | POA: Diagnosis not present

## 2020-10-19 DIAGNOSIS — G8929 Other chronic pain: Secondary | ICD-10-CM | POA: Diagnosis not present

## 2020-11-30 DIAGNOSIS — R300 Dysuria: Secondary | ICD-10-CM | POA: Diagnosis not present

## 2021-01-12 DIAGNOSIS — E1140 Type 2 diabetes mellitus with diabetic neuropathy, unspecified: Secondary | ICD-10-CM | POA: Diagnosis not present

## 2021-01-19 DIAGNOSIS — M5441 Lumbago with sciatica, right side: Secondary | ICD-10-CM | POA: Diagnosis not present

## 2021-01-19 DIAGNOSIS — M48062 Spinal stenosis, lumbar region with neurogenic claudication: Secondary | ICD-10-CM | POA: Diagnosis not present

## 2021-01-19 DIAGNOSIS — M5442 Lumbago with sciatica, left side: Secondary | ICD-10-CM | POA: Diagnosis not present

## 2021-01-19 DIAGNOSIS — G8929 Other chronic pain: Secondary | ICD-10-CM | POA: Diagnosis not present

## 2021-01-19 DIAGNOSIS — F32A Depression, unspecified: Secondary | ICD-10-CM | POA: Diagnosis not present

## 2021-01-19 DIAGNOSIS — E1165 Type 2 diabetes mellitus with hyperglycemia: Secondary | ICD-10-CM | POA: Diagnosis not present

## 2021-01-24 DIAGNOSIS — M5412 Radiculopathy, cervical region: Secondary | ICD-10-CM | POA: Diagnosis not present

## 2021-01-24 DIAGNOSIS — M546 Pain in thoracic spine: Secondary | ICD-10-CM | POA: Diagnosis not present

## 2021-01-24 DIAGNOSIS — M542 Cervicalgia: Secondary | ICD-10-CM | POA: Diagnosis not present

## 2021-01-24 DIAGNOSIS — G8929 Other chronic pain: Secondary | ICD-10-CM | POA: Diagnosis not present

## 2021-01-24 DIAGNOSIS — M48062 Spinal stenosis, lumbar region with neurogenic claudication: Secondary | ICD-10-CM | POA: Diagnosis not present

## 2021-01-24 DIAGNOSIS — M9931 Osseous stenosis of neural canal of cervical region: Secondary | ICD-10-CM | POA: Diagnosis not present

## 2021-01-24 DIAGNOSIS — M5442 Lumbago with sciatica, left side: Secondary | ICD-10-CM | POA: Diagnosis not present

## 2021-01-30 DIAGNOSIS — E1165 Type 2 diabetes mellitus with hyperglycemia: Secondary | ICD-10-CM | POA: Diagnosis not present

## 2021-01-30 DIAGNOSIS — M48062 Spinal stenosis, lumbar region with neurogenic claudication: Secondary | ICD-10-CM | POA: Diagnosis not present

## 2021-02-14 DIAGNOSIS — M546 Pain in thoracic spine: Secondary | ICD-10-CM | POA: Diagnosis not present

## 2021-02-14 DIAGNOSIS — G8929 Other chronic pain: Secondary | ICD-10-CM | POA: Diagnosis not present

## 2021-02-14 DIAGNOSIS — M47816 Spondylosis without myelopathy or radiculopathy, lumbar region: Secondary | ICD-10-CM | POA: Diagnosis not present

## 2021-02-14 DIAGNOSIS — M5442 Lumbago with sciatica, left side: Secondary | ICD-10-CM | POA: Diagnosis not present

## 2021-02-14 DIAGNOSIS — M5412 Radiculopathy, cervical region: Secondary | ICD-10-CM | POA: Diagnosis not present

## 2021-02-14 DIAGNOSIS — M9931 Osseous stenosis of neural canal of cervical region: Secondary | ICD-10-CM | POA: Diagnosis not present

## 2021-02-14 DIAGNOSIS — M48062 Spinal stenosis, lumbar region with neurogenic claudication: Secondary | ICD-10-CM | POA: Diagnosis not present

## 2021-02-14 DIAGNOSIS — M542 Cervicalgia: Secondary | ICD-10-CM | POA: Diagnosis not present

## 2021-02-16 DIAGNOSIS — M47816 Spondylosis without myelopathy or radiculopathy, lumbar region: Secondary | ICD-10-CM | POA: Diagnosis not present

## 2021-02-22 DIAGNOSIS — M47816 Spondylosis without myelopathy or radiculopathy, lumbar region: Secondary | ICD-10-CM | POA: Diagnosis not present

## 2021-02-22 DIAGNOSIS — M48062 Spinal stenosis, lumbar region with neurogenic claudication: Secondary | ICD-10-CM | POA: Diagnosis not present

## 2021-02-22 DIAGNOSIS — M9931 Osseous stenosis of neural canal of cervical region: Secondary | ICD-10-CM | POA: Diagnosis not present

## 2021-02-22 DIAGNOSIS — M546 Pain in thoracic spine: Secondary | ICD-10-CM | POA: Diagnosis not present

## 2021-02-22 DIAGNOSIS — M542 Cervicalgia: Secondary | ICD-10-CM | POA: Diagnosis not present

## 2021-02-22 DIAGNOSIS — M5442 Lumbago with sciatica, left side: Secondary | ICD-10-CM | POA: Diagnosis not present

## 2021-02-22 DIAGNOSIS — M5412 Radiculopathy, cervical region: Secondary | ICD-10-CM | POA: Diagnosis not present

## 2021-02-22 DIAGNOSIS — G8929 Other chronic pain: Secondary | ICD-10-CM | POA: Diagnosis not present

## 2021-02-25 ENCOUNTER — Other Ambulatory Visit: Payer: Self-pay

## 2021-02-25 ENCOUNTER — Emergency Department
Admission: EM | Admit: 2021-02-25 | Discharge: 2021-02-25 | Disposition: A | Discharge status: Home / Self Care | Payer: PPO | Attending: Emergency Medicine | Admitting: Emergency Medicine

## 2021-02-25 ENCOUNTER — Emergency Department: Payer: PPO

## 2021-02-25 DIAGNOSIS — M4316 Spondylolisthesis, lumbar region: Secondary | ICD-10-CM | POA: Diagnosis not present

## 2021-02-25 DIAGNOSIS — Z7984 Long term (current) use of oral hypoglycemic drugs: Secondary | ICD-10-CM | POA: Insufficient documentation

## 2021-02-25 DIAGNOSIS — N390 Urinary tract infection, site not specified: Secondary | ICD-10-CM | POA: Diagnosis not present

## 2021-02-25 DIAGNOSIS — Z20822 Contact with and (suspected) exposure to covid-19: Secondary | ICD-10-CM | POA: Insufficient documentation

## 2021-02-25 DIAGNOSIS — Z96611 Presence of right artificial shoulder joint: Secondary | ICD-10-CM | POA: Insufficient documentation

## 2021-02-25 DIAGNOSIS — K862 Cyst of pancreas: Secondary | ICD-10-CM | POA: Diagnosis not present

## 2021-02-25 DIAGNOSIS — G319 Degenerative disease of nervous system, unspecified: Secondary | ICD-10-CM | POA: Diagnosis not present

## 2021-02-25 DIAGNOSIS — R112 Nausea with vomiting, unspecified: Secondary | ICD-10-CM | POA: Diagnosis present

## 2021-02-25 DIAGNOSIS — Z96641 Presence of right artificial hip joint: Secondary | ICD-10-CM | POA: Diagnosis not present

## 2021-02-25 DIAGNOSIS — K5730 Diverticulosis of large intestine without perforation or abscess without bleeding: Secondary | ICD-10-CM | POA: Diagnosis not present

## 2021-02-25 DIAGNOSIS — R109 Unspecified abdominal pain: Secondary | ICD-10-CM | POA: Diagnosis not present

## 2021-02-25 DIAGNOSIS — Z96652 Presence of left artificial knee joint: Secondary | ICD-10-CM | POA: Insufficient documentation

## 2021-02-25 DIAGNOSIS — E119 Type 2 diabetes mellitus without complications: Secondary | ICD-10-CM | POA: Insufficient documentation

## 2021-02-25 DIAGNOSIS — R42 Dizziness and giddiness: Secondary | ICD-10-CM | POA: Insufficient documentation

## 2021-02-25 DIAGNOSIS — Z87891 Personal history of nicotine dependence: Secondary | ICD-10-CM | POA: Insufficient documentation

## 2021-02-25 DIAGNOSIS — K8020 Calculus of gallbladder without cholecystitis without obstruction: Secondary | ICD-10-CM | POA: Diagnosis not present

## 2021-02-25 LAB — URINALYSIS, COMPLETE (UACMP) WITH MICROSCOPIC
Bilirubin Urine: NEGATIVE
Glucose, UA: 150 mg/dL — AB
Ketones, ur: 5 mg/dL — AB
Nitrite: NEGATIVE
Protein, ur: NEGATIVE mg/dL
Specific Gravity, Urine: 1.02 (ref 1.005–1.030)
WBC, UA: >50 WBC/hpf — ABNORMAL HIGH (ref 0–5)
pH: 5 (ref 5.0–8.0)

## 2021-02-25 LAB — COMPREHENSIVE METABOLIC PANEL
ALT: 48 U/L — ABNORMAL HIGH (ref 0–44)
AST: 38 U/L (ref 15–41)
Albumin: 4.5 g/dL (ref 3.5–5.0)
Alkaline Phosphatase: 106 U/L (ref 38–126)
Anion gap: 9 (ref 5–15)
BUN: 20 mg/dL (ref 8–23)
CO2: 22 mmol/L (ref 22–32)
Calcium: 10.9 mg/dL — ABNORMAL HIGH (ref 8.9–10.3)
Chloride: 105 mmol/L (ref 98–111)
Creatinine, Ser: 0.88 mg/dL (ref 0.44–1.00)
GFR, Estimated: >60 mL/min (ref >60)
Glucose, Bld: 208 mg/dL — ABNORMAL HIGH (ref 70–99)
Potassium: 4 mmol/L (ref 3.5–5.1)
Sodium: 136 mmol/L (ref 135–145)
Total Bilirubin: 1 mg/dL (ref 0.3–1.2)
Total Protein: 7.7 g/dL (ref 6.5–8.1)

## 2021-02-25 LAB — CBC
HCT: 36.9 % (ref 36.0–46.0)
Hemoglobin: 13.2 g/dL (ref 12.0–15.0)
MCH: 34 pg (ref 26.0–34.0)
MCHC: 35.8 g/dL (ref 30.0–36.0)
MCV: 95.1 fL (ref 80.0–100.0)
Platelets: 203 10*3/uL (ref 150–400)
RBC: 3.88 MIL/uL (ref 3.87–5.11)
RDW: 11.7 % (ref 11.5–15.5)
WBC: 7.7 10*3/uL (ref 4.0–10.5)
nRBC: 0 % (ref 0.0–0.2)

## 2021-02-25 LAB — RESP PANEL BY RT-PCR (FLU A&B, COVID) ARPGX2
Influenza A by PCR: NEGATIVE
Influenza B by PCR: NEGATIVE
SARS Coronavirus 2 by RT PCR: NEGATIVE

## 2021-02-25 LAB — TROPONIN I (HIGH SENSITIVITY): Troponin I (High Sensitivity): 3 ng/L (ref <18)

## 2021-02-25 LAB — LIPASE, BLOOD: Lipase: 58 U/L — ABNORMAL HIGH (ref 11–51)

## 2021-02-25 MED ORDER — SODIUM CHLORIDE 0.9 % IV BOLUS: 500.0000 mL | Freq: Once | INTRAVENOUS | Status: DC

## 2021-02-25 MED ORDER — CEPHALEXIN 500 MG PO CAPS
500.0000 mg | ORAL_CAPSULE | Freq: Once | ORAL | Status: DC
  Filled 2021-02-25: qty 1

## 2021-02-25 MED ORDER — CEPHALEXIN 500 MG PO CAPS: 500.0000 mg | ORAL_CAPSULE | Freq: Two times a day (BID) | ORAL | 0 refills | Status: AC

## 2021-02-25 MED ORDER — ONDANSETRON 4 MG PO TBDP: 4.0000 mg | ORAL_TABLET | Freq: Three times a day (TID) | ORAL | 0 refills | Status: DC | PRN

## 2021-02-25 MED ORDER — SODIUM CHLORIDE 0.9 % IV SOLN
1.0000 g | Freq: Once | INTRAVENOUS | Status: DC
  Filled 2021-02-25: qty 10

## 2021-02-25 MED ORDER — ONDANSETRON 4 MG PO TBDP
4.0000 mg | ORAL_TABLET | Freq: Once | ORAL | Status: AC | PRN
  Administered 2021-02-25: 4 mg via ORAL
  Filled 2021-02-25: qty 1

## 2021-02-25 NOTE — ED Triage Notes (Signed)
Pt c/o n/v x 1 day and a "funny feeling/heaviness" in her head.  Denies pain.  Pt reports feeling like she was going to pass out x 1 episode yesterday and today.

## 2021-02-25 NOTE — ED Provider Notes (Addendum)
[facility] Emergency Department Provider Note  ____________________________________________   Event Date/Time   First MD Initiated Contact with Patient 02/25/21 1630     (approximate)  I have reviewed the triage vital signs and the nursing notes.   HISTORY  Chief Complaint Emesis    HPI DEJAI SCHUBACH is a 75 y.o. female with diabetes who comes in with concerns for a heaviness in her head and nausea and vomiting.  Patient reports feeling kind of dizzy.  She states is mostly when she tries to walk or move but she also described has a mild headache or some of this feeling in her head.  Denies falling or hitting her head.  Her symptoms are intermittent, worse with movement, better at rest.  She denies any chest pain, shortness of breath, abdominal pain but does report a little bit of dysuria and a little bit of blood noted in her urine.  Denies any clots.  She reports a lot of chronic back pain so does not really feel if she has any new back pain.  Denies a history of kidney stones..  Denies any abdominal pain.  Does report 2 episodes of nonbloody nonbilious vomiting this morning.            Past Medical History:  Diagnosis Date   Arthritis    Chest tightness    ECHO AND STRESS    Diabetes mellitus without complication ([place])    DJD (degenerative joint disease)    GERD (gastroesophageal reflux disease)    H/O hypercholesterolemia    told one time   History of shingles    left side of face jan 2015   Pneumonia    hx of   Shortness of breath    occasional, usually with activity    Patient Active Problem List   Diagnosis Date Noted   S/p reverse total shoulder arthroplasty 02/15/2020   Intraductal papilloma of breast, left 06/19/2017   OA (osteoarthritis) of knee 01/25/2014   [place] Acute blood loss anemia 02/26/2012   Postop Hyponatremia 02/26/2012   OA (osteoarthritis) of hip 02/25/2012    Past Surgical History:  Procedure  Laterality Date   BREAST BIOPSY Left 04/29/2012   core, INTRA DUCTAL PAPILLOMA WITH SCLEROSIS AND ASSOCIATED    BREAST BIOPSY Left 05/23/2017   INTRADUCTAL PAPILLOMA WITH USUAL DUCTAL HYPERPLASIA   BREAST BIOPSY Left 11/13/2019   [place] Bx, Ribbon clip (9:00 1 CMFN), path pending   BREAST BIOPSY Left 11/13/2019   [place] Bx, Heart clip(8 to 9:00 6 CMFN), path pending   CATARACT EXTRACTION Bilateral 2013   COLONOSCOPY     DILATION AND CURETTAGE OF UTERUS     REVERSE SHOULDER ARTHROPLASTY Right 02/15/2020   Procedure: Right reverse shoulder arthroplasty, biceps tenodesis;  Surgeon: Leim Fabry, MD;  Location: [facility] ORS;  Service: Orthopedics;  Laterality: Right;   TOTAL HIP ARTHROPLASTY  02/25/2012   Procedure: TOTAL HIP ARTHROPLASTY;  Surgeon: Gearlean Alf, MD;  Location: [facility] ORS;  Service: Orthopedics;  Laterality: Right;   TOTAL KNEE ARTHROPLASTY Left 01/25/2014   Procedure: LEFT TOTAL KNEE ARTHROPLASTY WITH RIGHT KNEE INJECTION;  Surgeon: Gearlean Alf, MD;  Location: [facility] ORS;  Service: Orthopedics;  Laterality: Left;    Prior to Admission medications   Medication Sig Start Date End Date Taking? Authorizing Provider  acetaminophen (TYLENOL) 500 MG tablet Take 1,000 mg by mouth every 6 (six) hours as needed.    [provider]  gabapentin (NEURONTIN) 300 MG capsule Take  600 mg by mouth at bedtime.    [provider]  metFORMIN (GLUCOPHAGE-XR) 500 MG 24 hr tablet Take 1,000 mg by mouth every evening.  11/01/19   [provider]  omeprazole (PRILOSEC) 20 MG capsule Take 20 mg by mouth daily. prn    [provider]  ondansetron (ZOFRAN ODT) 4 MG disintegrating tablet Take 1 tablet (4 mg total) by mouth every 8 (eight) hours as needed for nausea or vomiting. 02/15/20   Leim Fabry, MD  prednisoLONE acetate (PRED FORTE) 1 % ophthalmic suspension Place 1 drop into the left eye every other day. 08/25/19   [provider]  traMADol (ULTRAM) 50 MG tablet Take  50-100 mg by mouth every 8 (eight) hours as needed (pain).  01/21/20   [provider]    Allergies Patient has no known allergies.  Family History  Problem Relation Age of Onset   Colon cancer Father 66   Cancer Sister        ? colon   Cancer - Lung Brother    Breast cancer Neg Hx     Social History Social History   Tobacco Use   Smoking status: Former    Packs/day: 2.00    Years: 20.00    Pack years: 40.00    Types: Cigarettes    Quit date: 05/04/1973    Years since quitting: 47.8   Smokeless tobacco: Never  Vaping Use   Vaping Use: Never used  Substance Use Topics   Alcohol use: No   Drug use: No      Review of Systems Constitutional: No fever/chills Eyes: No visual changes. ENT: No sore throat. Cardiovascular: Denies chest pain. Respiratory: Denies shortness of breath. Gastrointestinal: No abdominal pain.  Positive vomiting no diarrhea.  No constipation. Genitourinary: Dysuria, hematuria Musculoskeletal: Chronic back Skin: Negative for rash. Neurological: Positive headache, dizzy All other ROS negative ____________________________________________   PHYSICAL EXAM:  VITAL SIGNS: ED Triage Vitals [02/25/21 1402]  Enc Vitals Group     BP (!) 170/103     Pulse Rate 71     Resp 16     Temp 98.6 F (37 C)     Temp Source Oral     SpO2 96 %     Weight 200 lb (90.7 kg)     Height 5\' 7"  (1.702 m)     Head Circumference      Peak Flow      Pain Score 0     Pain Loc      Pain Edu?      Excl. in [place]?     Constitutional: Alert and oriented. Well appearing and in no acute distress. Eyes: Conjunctivae are normal. EOMI. Head: Atraumatic. Nose: No congestion/rhinnorhea. Mouth/Throat: Mucous membranes are moist.   Neck: No stridor. Trachea Midline. FROM Cardiovascular: Normal rate, regular rhythm. Grossly normal heart sounds.  Good peripheral circulation. Respiratory: Normal respiratory effort.  No retractions. Lungs CTAB. Gastrointestinal: Soft  and nontender. No distention. No abdominal bruits.  Musculoskeletal: No lower extremity tenderness nor edema.  No joint effusions. Neurologic:  Normal speech and language. No gross focal neurologic deficits are appreciated.  Finger-nose intact.  Equal strength in arms and legs.  Cranial nerves are intact Skin:  Skin is warm, dry and intact. No rash noted. Psychiatric: Mood and affect are normal. Speech and behavior are normal. GU: Deferred   ____________________________________________   LABS (all labs ordered are listed, but only abnormal results are displayed)  Labs Reviewed  LIPASE, BLOOD -  Abnormal; Notable for the following components:      Result Value   Lipase 58 (*)    All other components within normal limits  COMPREHENSIVE METABOLIC PANEL - Abnormal; Notable for the following components:   Glucose, Bld 208 (*)    Calcium 10.9 (*)    ALT 48 (*)    All other components within normal limits  URINALYSIS, COMPLETE (UACMP) WITH MICROSCOPIC - Abnormal; Notable for the following components:   Color, Urine YELLOW (*)    APPearance CLOUDY (*)    Glucose, UA 150 (*)    Hgb urine dipstick SMALL (*)    Ketones, ur 5 (*)    Leukocytes,Ua LARGE (*)    WBC, UA >50 (*)    Bacteria, UA RARE (*)    All other components within normal limits  RESP PANEL BY RT-PCR (FLU A&B, COVID) ARPGX2  CBC  TROPONIN I (HIGH SENSITIVITY)  TROPONIN I (HIGH SENSITIVITY)   ____________________________________________   ED ECG REPORT I, Vanessa [place], the attending physician, personally viewed and interpreted this ECG.  Normal sinus rate of 70, no ST elevation, no T wave inversions, type I AV block ____________________________________________  RADIOLOGY   Official radiology report(s): CT HEAD WO CONTRAST (5MM)  Result Date: 02/25/2021 CLINICAL DATA:  Dizziness, non-specific EXAM: CT HEAD WITHOUT CONTRAST TECHNIQUE: Contiguous axial images were obtained from the base of the skull through the  vertex without intravenous contrast. COMPARISON:  None. FINDINGS: Brain: No definitive evidence of acute infarction, hemorrhage, hydrocephalus, extra-axial collection or mass lesion/mass effect. There are small lacunar sized hypodensities in bilateral basal ganglia (series 2, image 13, series 2, image 14). Vascular: No hyperdense vessel or unexpected calcification. Skull: Normal. Negative for fracture or focal lesion. Sinuses/Orbits: No acute finding. Other: None. IMPRESSION: 1. No definitive acute intracranial abnormality. There are small bilateral basal ganglia hypodensities which likely reflect sequela of chronic microvascular disease versus remote lacunar infarctions. If concern for acute infarction, recommend dedicated MRI. Electronically Signed   By: Valentino Saxon M.D.   On: 02/25/2021 14:44    ____________________________________________   PROCEDURES  Procedure(s) performed (including Critical Care):  .1-3 Lead EKG Interpretation Performed by: Vanessa [place], MD Authorized by: Vanessa [place], MD     Interpretation: normal     ECG rate:  70s   ECG rate assessment: normal     Rhythm: sinus rhythm     Ectopy: none     Conduction: normal     ____________________________________________   INITIAL IMPRESSION / ASSESSMENT AND PLAN / ED COURSE  IVELISE CASTILLO was evaluated in Emergency Department on 02/25/2021 for the symptoms described in the history of present illness. She was evaluated in the context of the global COVID-19 pandemic, which necessitated consideration that the patient might be at risk for infection with the SARS-CoV-2 virus that causes COVID-19. Institutional protocols and algorithms that pertain to the evaluation of patients at risk for COVID-19 are in a state of rapid change based on information released by regulatory bodies including the CDC and federal and state organizations. These policies and algorithms were followed during the patient's care in the ED.     Patient comes in with some dizziness and some mild headaches associate with vomiting.  Given patient's age I think be reasonable to start off with a CT head to make sure of his intracranial hemorrhage, mass especially given the vomiting.  Labs ordered evaluate for any electrolyte abnormalities, AKI, UTI, ACS.  Patient was kept on the cardiac  monitor to evaluate for an arrhythmia.  Labs are all reassuring.  No evidence of ACS and symptoms of a going on for greater than 3 hours he does not need repeat troponin.  Urine does look consistent with UTI.  Given some of the chronic back pain we will get a CT renal just to make sure is no kidney stones that could be causing the hematuria given she would need to have a stent placed given her UTI.  CT scan does show a lot of microvascular ischemia recommend an MRI.  Concerned about stroke.  Patient is feeling a lot better but I do still feel like this could be a posterior stroke so we will get MRI to rule out.  Patient given some IV ceftriaxone to help with a UTI.  CT scan was reassuring.  Did have some incidental findings that I discussed with the family and provided them a copy of the report.  MRI without any acute abnormalities.  I did discuss some chronic infarcts.  We will give her neurology follow-up for outpatient and we discussed return precautions in regards to stroke  Patient reports no symptoms while laying still is a very low suspicion for stroke missed on MRI.  We will get patient up to walker and make sure that she is not feeling dizzy and if she can ambulate without any ataxia we will plan to discharge patient home with treatment for UTI.  7:18 PM patient not gotten the IV fluids or IV ceftriaxone yet but states she is feeling better and wants to go without the IV is okay which is doing a p.o. dose of the oral hydration which I think is reasonable given her reassuring labs.  Patient is ambulatory without significant symptoms.  No ataxia  noted         ____________________________________________   FINAL CLINICAL IMPRESSION(S) / ED DIAGNOSES   Final diagnoses:  Urinary tract infection without hematuria, site unspecified  Dizziness      MEDICATIONS GIVEN DURING THIS VISIT:  Medications  ondansetron (ZOFRAN-ODT) disintegrating tablet 4 mg (has no administration in time range)  sodium chloride 0.9 % bolus 500 mL (has no administration in time range)  cefTRIAXone (ROCEPHIN) 1 g in sodium chloride 0.9 % 100 mL IVPB (has no administration in time range)     ED Discharge Orders          Ordered    cephALEXin (KEFLEX) 500 MG capsule  2 times daily        02/25/21 1834    ondansetron (ZOFRAN ODT) 4 MG disintegrating tablet  Every 8 hours PRN        02/25/21 1834             Note:  This document was prepared using Dragon voice recognition software and may include unintentional dictation errors.    Vanessa [place], MD 02/25/21 Velta [place]    Vanessa [place], MD 02/25/21 [phone]

## 2021-02-25 NOTE — ED Provider Notes (Signed)
HPI: Pt is a 75 y.o. female who presents with complaints of emesis   The patient p/w  episode of vomiting and dizzy. Feeling off in the head.  No cp no sob, no abd pain. Some dysruia   ROS: Denies fever, chest pain, vomiting  Past Medical History:  Diagnosis Date   Arthritis    Chest tightness    ECHO AND STRESS    Diabetes mellitus without complication ([facility])    DJD (degenerative joint disease)    GERD (gastroesophageal reflux disease)    H/O hypercholesterolemia    told one time   History of shingles    left side of face jan 2015   Pneumonia    hx of   Shortness of breath    occasional, usually with activity   Vitals:   02/25/21 1402  BP: (!) 170/103  Pulse: 71  Resp: 16  Temp: 98.6 F (37 C)  SpO2: 96%    Focused Physical Exam: Gen: No acute distress Head: atraumatic, normocephalic Eyes: Extraocular movements grossly intact; conjunctiva clear CV: RRR Lung: No increased WOB, no stridor GI: ND, no obvious masses Neuro: Alert and awake. Equal strength in arms and legs   Medical Decision Making and Plan: Given the patient's initial medical screening exam, the following diagnostic evaluation has been ordered. The patient will be placed in the appropriate treatment space, once one is available, to complete the evaluation and treatment. I have discussed the plan of care with the patient and I have advised the patient that an ED physician or mid-level practitioner will reevaluate their condition after the test results have been received, as the results may give them additional insight into the type of treatment they may need.   Diagnostics:CT/labs/covid   Treatments: zofran    Vanessa [place], MD 02/25/21 1416

## 2021-02-25 NOTE — Discharge Instructions (Addendum)
Do slow position changes and take the Keflex to help with UTI and Zofran to help with nausea.  Follow-up with neurology for his some concerns for old stroke.  If develop worsening symptoms return to the ER or for any other concern See your CT imaging report below and follow-up with your PCP   No evidence of acute intracranial abnormality.   Chronic lacunar infarct within the left internal capsule/basal ganglia.   Background mild chronic small vessel ischemic changes within the cerebral white matter.   Chronic lacunar infarct within the right cerebellar hemisphere.   Mild generalized parenchymal atrophy.   IMPRESSION: 1. No CT etiology for acute abdominal pain identified. 2. There is an indeterminate 17 mm cystic appearing mass of the pancreatic tail. With the patient is clinically stable and able to follow directions and hold their breath (preferably as an outpatient) further evaluation with dedicated pancreatic protocol MRI with and without contrast with MRCP should be considered. 3. Irregular opacity of the RIGHT lower lobe likely reflects scar given associated distortion of the pulmonary parenchyma but is technically indeterminate. Non-contrast chest CT at 6-12 months is recommended. If the nodule is stable at time of repeat CT, then future CT at 18-24 months (from today's scan) is considered optional for low-risk patients, but is recommended for high-risk patients. This recommendation follows the consensus statement: Guidelines for Management of Incidental Pulmonary Nodules Detected on CT Images: From the Fleischner Society 2017; Radiology 2017; 284:228-243.

## 2021-02-27 LAB — URINE CULTURE

## 2021-02-28 DIAGNOSIS — N390 Urinary tract infection, site not specified: Secondary | ICD-10-CM | POA: Diagnosis not present

## 2021-02-28 DIAGNOSIS — Z8673 Personal history of transient ischemic attack (TIA), and cerebral infarction without residual deficits: Secondary | ICD-10-CM | POA: Diagnosis not present

## 2021-02-28 DIAGNOSIS — R030 Elevated blood-pressure reading, without diagnosis of hypertension: Secondary | ICD-10-CM | POA: Diagnosis not present

## 2021-02-28 DIAGNOSIS — K869 Disease of pancreas, unspecified: Secondary | ICD-10-CM | POA: Diagnosis not present

## 2021-02-28 DIAGNOSIS — R1900 Intra-abdominal and pelvic swelling, mass and lump, unspecified site: Secondary | ICD-10-CM | POA: Diagnosis not present

## 2021-02-28 DIAGNOSIS — R918 Other nonspecific abnormal finding of lung field: Secondary | ICD-10-CM | POA: Diagnosis not present

## 2021-03-01 ENCOUNTER — Other Ambulatory Visit: Payer: Self-pay | Admitting: Family Medicine

## 2021-03-01 DIAGNOSIS — K869 Disease of pancreas, unspecified: Secondary | ICD-10-CM

## 2021-03-01 DIAGNOSIS — R1900 Intra-abdominal and pelvic swelling, mass and lump, unspecified site: Secondary | ICD-10-CM

## 2021-03-10 ENCOUNTER — Ambulatory Visit
Admission: RE | Admit: 2021-03-10 | Discharge: 2021-03-10 | Disposition: A | Discharge status: Home / Self Care | Payer: PPO | Source: Ambulatory Visit | Attending: Family Medicine | Admitting: Family Medicine

## 2021-03-10 DIAGNOSIS — I700 Atherosclerosis of aorta: Secondary | ICD-10-CM | POA: Diagnosis not present

## 2021-03-10 DIAGNOSIS — R1900 Intra-abdominal and pelvic swelling, mass and lump, unspecified site: Secondary | ICD-10-CM | POA: Diagnosis not present

## 2021-03-10 DIAGNOSIS — K869 Disease of pancreas, unspecified: Secondary | ICD-10-CM | POA: Diagnosis not present

## 2021-03-10 DIAGNOSIS — K8020 Calculus of gallbladder without cholecystitis without obstruction: Secondary | ICD-10-CM | POA: Diagnosis not present

## 2021-03-10 DIAGNOSIS — K862 Cyst of pancreas: Secondary | ICD-10-CM | POA: Diagnosis not present

## 2021-03-10 DIAGNOSIS — K760 Fatty (change of) liver, not elsewhere classified: Secondary | ICD-10-CM | POA: Diagnosis not present

## 2021-03-10 MED ORDER — GADOBUTROL 1 MMOL/ML IV SOLN
9.0000 mL | Freq: Once | INTRAVENOUS | Status: AC | PRN
  Administered 2021-03-10: 9 mL via INTRAVENOUS

## 2021-04-13 DIAGNOSIS — X32XXXA Exposure to sunlight, initial encounter: Secondary | ICD-10-CM | POA: Diagnosis not present

## 2021-04-13 DIAGNOSIS — D225 Melanocytic nevi of trunk: Secondary | ICD-10-CM | POA: Diagnosis not present

## 2021-04-13 DIAGNOSIS — L570 Actinic keratosis: Secondary | ICD-10-CM | POA: Diagnosis not present

## 2021-04-13 DIAGNOSIS — L821 Other seborrheic keratosis: Secondary | ICD-10-CM | POA: Diagnosis not present

## 2021-04-13 DIAGNOSIS — D2262 Melanocytic nevi of left upper limb, including shoulder: Secondary | ICD-10-CM | POA: Diagnosis not present

## 2021-04-13 DIAGNOSIS — D2261 Melanocytic nevi of right upper limb, including shoulder: Secondary | ICD-10-CM | POA: Diagnosis not present

## 2021-04-13 DIAGNOSIS — D2272 Melanocytic nevi of left lower limb, including hip: Secondary | ICD-10-CM | POA: Diagnosis not present

## 2021-04-13 DIAGNOSIS — L988 Other specified disorders of the skin and subcutaneous tissue: Secondary | ICD-10-CM | POA: Diagnosis not present

## 2021-04-13 DIAGNOSIS — D2271 Melanocytic nevi of right lower limb, including hip: Secondary | ICD-10-CM | POA: Diagnosis not present

## 2021-05-01 DIAGNOSIS — Z8673 Personal history of transient ischemic attack (TIA), and cerebral infarction without residual deficits: Secondary | ICD-10-CM | POA: Diagnosis not present

## 2021-05-01 DIAGNOSIS — R2689 Other abnormalities of gait and mobility: Secondary | ICD-10-CM | POA: Diagnosis not present

## 2021-05-01 DIAGNOSIS — R262 Difficulty in walking, not elsewhere classified: Secondary | ICD-10-CM | POA: Diagnosis not present

## 2021-06-06 DIAGNOSIS — R200 Anesthesia of skin: Secondary | ICD-10-CM | POA: Diagnosis not present

## 2021-06-06 DIAGNOSIS — R202 Paresthesia of skin: Secondary | ICD-10-CM | POA: Diagnosis not present

## 2021-07-11 DIAGNOSIS — F32A Depression, unspecified: Secondary | ICD-10-CM | POA: Diagnosis not present

## 2021-07-11 DIAGNOSIS — R2689 Other abnormalities of gait and mobility: Secondary | ICD-10-CM | POA: Diagnosis not present

## 2021-07-11 DIAGNOSIS — M48062 Spinal stenosis, lumbar region with neurogenic claudication: Secondary | ICD-10-CM | POA: Diagnosis not present

## 2021-07-11 DIAGNOSIS — R4189 Other symptoms and signs involving cognitive functions and awareness: Secondary | ICD-10-CM | POA: Diagnosis not present

## 2021-07-11 DIAGNOSIS — R5381 Other malaise: Secondary | ICD-10-CM | POA: Diagnosis not present

## 2021-07-11 DIAGNOSIS — I10 Essential (primary) hypertension: Secondary | ICD-10-CM | POA: Diagnosis not present

## 2021-07-11 DIAGNOSIS — E1165 Type 2 diabetes mellitus with hyperglycemia: Secondary | ICD-10-CM | POA: Diagnosis not present

## 2021-07-11 DIAGNOSIS — M2550 Pain in unspecified joint: Secondary | ICD-10-CM | POA: Diagnosis not present

## 2021-07-11 DIAGNOSIS — G629 Polyneuropathy, unspecified: Secondary | ICD-10-CM | POA: Diagnosis not present

## 2021-07-11 DIAGNOSIS — R5383 Other fatigue: Secondary | ICD-10-CM | POA: Diagnosis not present

## 2021-07-11 DIAGNOSIS — R399 Unspecified symptoms and signs involving the genitourinary system: Secondary | ICD-10-CM | POA: Diagnosis not present

## 2021-07-12 DIAGNOSIS — I10 Essential (primary) hypertension: Secondary | ICD-10-CM | POA: Diagnosis not present

## 2021-07-12 DIAGNOSIS — M48062 Spinal stenosis, lumbar region with neurogenic claudication: Secondary | ICD-10-CM | POA: Diagnosis not present

## 2021-07-12 DIAGNOSIS — R2689 Other abnormalities of gait and mobility: Secondary | ICD-10-CM | POA: Diagnosis not present

## 2021-07-12 DIAGNOSIS — R5381 Other malaise: Secondary | ICD-10-CM | POA: Diagnosis not present

## 2021-07-12 DIAGNOSIS — G629 Polyneuropathy, unspecified: Secondary | ICD-10-CM | POA: Diagnosis not present

## 2021-07-12 DIAGNOSIS — R4189 Other symptoms and signs involving cognitive functions and awareness: Secondary | ICD-10-CM | POA: Diagnosis not present

## 2021-07-12 DIAGNOSIS — R399 Unspecified symptoms and signs involving the genitourinary system: Secondary | ICD-10-CM | POA: Diagnosis not present

## 2021-07-12 DIAGNOSIS — E1165 Type 2 diabetes mellitus with hyperglycemia: Secondary | ICD-10-CM | POA: Diagnosis not present

## 2021-07-12 DIAGNOSIS — R5383 Other fatigue: Secondary | ICD-10-CM | POA: Diagnosis not present

## 2021-07-12 DIAGNOSIS — M2550 Pain in unspecified joint: Secondary | ICD-10-CM | POA: Diagnosis not present

## 2021-07-12 DIAGNOSIS — F32A Depression, unspecified: Secondary | ICD-10-CM | POA: Diagnosis not present

## 2021-07-18 DIAGNOSIS — R8290 Unspecified abnormal findings in urine: Secondary | ICD-10-CM | POA: Diagnosis not present

## 2021-08-17 DIAGNOSIS — E1140 Type 2 diabetes mellitus with diabetic neuropathy, unspecified: Secondary | ICD-10-CM | POA: Diagnosis not present

## 2021-08-17 DIAGNOSIS — I10 Essential (primary) hypertension: Secondary | ICD-10-CM | POA: Diagnosis not present

## 2021-08-17 DIAGNOSIS — E785 Hyperlipidemia, unspecified: Secondary | ICD-10-CM | POA: Diagnosis not present

## 2021-08-17 DIAGNOSIS — K219 Gastro-esophageal reflux disease without esophagitis: Secondary | ICD-10-CM | POA: Diagnosis not present

## 2021-08-17 DIAGNOSIS — M48062 Spinal stenosis, lumbar region with neurogenic claudication: Secondary | ICD-10-CM | POA: Diagnosis not present

## 2021-08-17 DIAGNOSIS — E119 Type 2 diabetes mellitus without complications: Secondary | ICD-10-CM | POA: Diagnosis not present

## 2021-11-14 DIAGNOSIS — M545 Low back pain, unspecified: Secondary | ICD-10-CM | POA: Diagnosis not present

## 2021-11-14 DIAGNOSIS — M79605 Pain in left leg: Secondary | ICD-10-CM | POA: Diagnosis not present

## 2021-11-14 DIAGNOSIS — M25561 Pain in right knee: Secondary | ICD-10-CM | POA: Diagnosis not present

## 2021-11-14 DIAGNOSIS — I10 Essential (primary) hypertension: Secondary | ICD-10-CM | POA: Diagnosis not present

## 2021-11-29 DIAGNOSIS — M25561 Pain in right knee: Secondary | ICD-10-CM | POA: Diagnosis not present

## 2021-11-29 DIAGNOSIS — M5432 Sciatica, left side: Secondary | ICD-10-CM | POA: Diagnosis not present

## 2021-12-12 DIAGNOSIS — M48062 Spinal stenosis, lumbar region with neurogenic claudication: Secondary | ICD-10-CM | POA: Diagnosis not present

## 2021-12-12 DIAGNOSIS — G8929 Other chronic pain: Secondary | ICD-10-CM | POA: Diagnosis not present

## 2021-12-12 DIAGNOSIS — M5442 Lumbago with sciatica, left side: Secondary | ICD-10-CM | POA: Diagnosis not present

## 2021-12-29 DIAGNOSIS — E119 Type 2 diabetes mellitus without complications: Secondary | ICD-10-CM | POA: Diagnosis not present

## 2022-01-02 DIAGNOSIS — E785 Hyperlipidemia, unspecified: Secondary | ICD-10-CM | POA: Diagnosis not present

## 2022-01-02 DIAGNOSIS — K219 Gastro-esophageal reflux disease without esophagitis: Secondary | ICD-10-CM | POA: Diagnosis not present

## 2022-01-02 DIAGNOSIS — E1142 Type 2 diabetes mellitus with diabetic polyneuropathy: Secondary | ICD-10-CM | POA: Diagnosis not present

## 2022-01-02 DIAGNOSIS — E1169 Type 2 diabetes mellitus with other specified complication: Secondary | ICD-10-CM | POA: Diagnosis not present

## 2022-01-02 DIAGNOSIS — E669 Obesity, unspecified: Secondary | ICD-10-CM | POA: Diagnosis not present

## 2022-01-02 DIAGNOSIS — Z9849 Cataract extraction status, unspecified eye: Secondary | ICD-10-CM | POA: Diagnosis not present

## 2022-01-02 DIAGNOSIS — E1165 Type 2 diabetes mellitus with hyperglycemia: Secondary | ICD-10-CM | POA: Diagnosis not present

## 2022-01-02 DIAGNOSIS — G8929 Other chronic pain: Secondary | ICD-10-CM | POA: Diagnosis not present

## 2022-01-02 DIAGNOSIS — I10 Essential (primary) hypertension: Secondary | ICD-10-CM | POA: Diagnosis not present

## 2022-01-02 DIAGNOSIS — Z87891 Personal history of nicotine dependence: Secondary | ICD-10-CM | POA: Diagnosis not present

## 2022-01-02 DIAGNOSIS — M5136 Other intervertebral disc degeneration, lumbar region: Secondary | ICD-10-CM | POA: Diagnosis not present

## 2022-01-11 DIAGNOSIS — E119 Type 2 diabetes mellitus without complications: Secondary | ICD-10-CM | POA: Diagnosis not present

## 2022-01-11 DIAGNOSIS — M48062 Spinal stenosis, lumbar region with neurogenic claudication: Secondary | ICD-10-CM | POA: Diagnosis not present

## 2022-01-30 DIAGNOSIS — M48062 Spinal stenosis, lumbar region with neurogenic claudication: Secondary | ICD-10-CM | POA: Diagnosis not present

## 2022-01-30 DIAGNOSIS — G8929 Other chronic pain: Secondary | ICD-10-CM | POA: Diagnosis not present

## 2022-01-30 DIAGNOSIS — M5442 Lumbago with sciatica, left side: Secondary | ICD-10-CM | POA: Diagnosis not present

## 2022-01-30 DIAGNOSIS — M5412 Radiculopathy, cervical region: Secondary | ICD-10-CM | POA: Diagnosis not present

## 2022-01-30 DIAGNOSIS — M542 Cervicalgia: Secondary | ICD-10-CM | POA: Diagnosis not present

## 2022-01-30 DIAGNOSIS — M9931 Osseous stenosis of neural canal of cervical region: Secondary | ICD-10-CM | POA: Diagnosis not present

## 2022-03-20 DIAGNOSIS — E785 Hyperlipidemia, unspecified: Secondary | ICD-10-CM | POA: Diagnosis not present

## 2022-03-20 DIAGNOSIS — E1140 Type 2 diabetes mellitus with diabetic neuropathy, unspecified: Secondary | ICD-10-CM | POA: Diagnosis not present

## 2022-03-20 DIAGNOSIS — I10 Essential (primary) hypertension: Secondary | ICD-10-CM | POA: Diagnosis not present

## 2022-03-20 DIAGNOSIS — E1165 Type 2 diabetes mellitus with hyperglycemia: Secondary | ICD-10-CM | POA: Diagnosis not present

## 2022-03-21 DIAGNOSIS — M5489 Other dorsalgia: Secondary | ICD-10-CM | POA: Diagnosis not present

## 2022-03-21 DIAGNOSIS — E1165 Type 2 diabetes mellitus with hyperglycemia: Secondary | ICD-10-CM | POA: Diagnosis not present

## 2022-03-27 DIAGNOSIS — E1165 Type 2 diabetes mellitus with hyperglycemia: Secondary | ICD-10-CM | POA: Diagnosis not present

## 2022-03-27 DIAGNOSIS — Z0000 Encounter for general adult medical examination without abnormal findings: Secondary | ICD-10-CM | POA: Diagnosis not present

## 2022-03-27 DIAGNOSIS — M79672 Pain in left foot: Secondary | ICD-10-CM | POA: Diagnosis not present

## 2022-03-27 DIAGNOSIS — E669 Obesity, unspecified: Secondary | ICD-10-CM | POA: Diagnosis not present

## 2022-03-27 DIAGNOSIS — M79601 Pain in right arm: Secondary | ICD-10-CM | POA: Diagnosis not present

## 2022-03-27 DIAGNOSIS — M79671 Pain in right foot: Secondary | ICD-10-CM | POA: Diagnosis not present

## 2022-03-27 DIAGNOSIS — M545 Low back pain, unspecified: Secondary | ICD-10-CM | POA: Diagnosis not present

## 2022-03-27 DIAGNOSIS — Z1331 Encounter for screening for depression: Secondary | ICD-10-CM | POA: Diagnosis not present

## 2022-03-27 DIAGNOSIS — G8929 Other chronic pain: Secondary | ICD-10-CM | POA: Diagnosis not present

## 2022-04-02 DIAGNOSIS — H524 Presbyopia: Secondary | ICD-10-CM | POA: Diagnosis not present

## 2022-04-02 DIAGNOSIS — B0233 Zoster keratitis: Secondary | ICD-10-CM | POA: Diagnosis not present

## 2022-06-06 DIAGNOSIS — E1165 Type 2 diabetes mellitus with hyperglycemia: Secondary | ICD-10-CM | POA: Diagnosis not present

## 2022-06-06 DIAGNOSIS — M25561 Pain in right knee: Secondary | ICD-10-CM | POA: Diagnosis not present

## 2022-06-06 DIAGNOSIS — M1711 Unilateral primary osteoarthritis, right knee: Secondary | ICD-10-CM | POA: Diagnosis not present

## 2022-06-13 ENCOUNTER — Other Ambulatory Visit: Payer: Self-pay | Admitting: Orthopedic Surgery

## 2022-06-19 ENCOUNTER — Encounter
Admission: RE | Admit: 2022-06-19 | Discharge: 2022-06-19 | Disposition: A | Discharge status: Home / Self Care | Payer: Medicare HMO | Source: Ambulatory Visit | Attending: Orthopedic Surgery | Admitting: Orthopedic Surgery

## 2022-06-19 VITALS — BP 138/70 | HR 85 | Resp 16 | Ht 66.0 in | Wt 197.0 lb

## 2022-06-19 DIAGNOSIS — Z01818 Encounter for other preprocedural examination: Secondary | ICD-10-CM

## 2022-06-21 ENCOUNTER — Encounter
Admission: RE | Admit: 2022-06-21 | Discharge: 2022-06-21 | Disposition: A | Discharge status: Home / Self Care | Payer: Medicare HMO | Source: Ambulatory Visit | Attending: Orthopedic Surgery | Admitting: Orthopedic Surgery

## 2022-06-21 DIAGNOSIS — Z01818 Encounter for other preprocedural examination: Secondary | ICD-10-CM | POA: Diagnosis not present

## 2022-06-21 DIAGNOSIS — R8290 Unspecified abnormal findings in urine: Secondary | ICD-10-CM | POA: Insufficient documentation

## 2022-06-21 DIAGNOSIS — R0602 Shortness of breath: Secondary | ICD-10-CM | POA: Diagnosis not present

## 2022-06-21 DIAGNOSIS — Z01810 Encounter for preprocedural cardiovascular examination: Secondary | ICD-10-CM

## 2022-06-21 DIAGNOSIS — Z01812 Encounter for preprocedural laboratory examination: Secondary | ICD-10-CM

## 2022-06-21 DIAGNOSIS — E119 Type 2 diabetes mellitus without complications: Secondary | ICD-10-CM | POA: Diagnosis not present

## 2022-06-21 DIAGNOSIS — Z96619 Presence of unspecified artificial shoulder joint: Secondary | ICD-10-CM

## 2022-06-21 DIAGNOSIS — R0789 Other chest pain: Secondary | ICD-10-CM

## 2022-06-21 DIAGNOSIS — E7800 Pure hypercholesterolemia, unspecified: Secondary | ICD-10-CM

## 2022-06-21 DIAGNOSIS — E871 Hypo-osmolality and hyponatremia: Secondary | ICD-10-CM

## 2022-06-21 DIAGNOSIS — D62 Acute posthemorrhagic anemia: Secondary | ICD-10-CM

## 2022-06-21 HISTORY — DX: Essential (primary) hypertension: I10

## 2022-06-21 HISTORY — DX: Transient cerebral ischemic attack, unspecified: G45.9

## 2022-06-21 LAB — URINALYSIS, COMPLETE (UACMP) WITH MICROSCOPIC
Bacteria, UA: NONE SEEN
Bilirubin Urine: NEGATIVE
Glucose, UA: NEGATIVE mg/dL
Ketones, ur: NEGATIVE mg/dL
Nitrite: NEGATIVE
Protein, ur: NEGATIVE mg/dL
Specific Gravity, Urine: 1.021 (ref 1.005–1.030)
WBC, UA: >50 WBC/hpf — ABNORMAL HIGH (ref 0–5)
pH: 5 (ref 5.0–8.0)

## 2022-06-21 LAB — TYPE AND SCREEN
ABO/RH(D): O POS
Antibody Screen: NEGATIVE

## 2022-06-21 LAB — CBC WITH DIFFERENTIAL/PLATELET
Abs Immature Granulocytes: 0.03 K/uL (ref 0.00–0.07)
Basophils Absolute: 0.1 K/uL (ref 0.0–0.1)
Basophils Relative: 1 %
Eosinophils Absolute: 0.2 K/uL (ref 0.0–0.5)
Eosinophils Relative: 2 %
HCT: 40.1 % (ref 36.0–46.0)
Hemoglobin: 13.1 g/dL (ref 12.0–15.0)
Immature Granulocytes: 0 %
Lymphocytes Relative: 29 %
Lymphs Abs: 2.3 K/uL (ref 0.7–4.0)
MCH: 31.3 pg (ref 26.0–34.0)
MCHC: 32.7 g/dL (ref 30.0–36.0)
MCV: 95.7 fL (ref 80.0–100.0)
Monocytes Absolute: 0.4 K/uL (ref 0.1–1.0)
Monocytes Relative: 5 %
Neutro Abs: 4.9 K/uL (ref 1.7–7.7)
Neutrophils Relative %: 63 %
Platelets: 218 K/uL (ref 150–400)
RBC: 4.19 MIL/uL (ref 3.87–5.11)
RDW: 11.9 % (ref 11.5–15.5)
WBC: 7.8 K/uL (ref 4.0–10.5)
nRBC: 0 % (ref 0.0–0.2)

## 2022-06-21 LAB — COMPREHENSIVE METABOLIC PANEL WITH GFR
ALT: 27 U/L (ref 0–44)
AST: 30 U/L (ref 15–41)
Albumin: 4.2 g/dL (ref 3.5–5.0)
Alkaline Phosphatase: 100 U/L (ref 38–126)
Anion gap: 8 (ref 5–15)
BUN: 19 mg/dL (ref 8–23)
CO2: 23 mmol/L (ref 22–32)
Calcium: 11.3 mg/dL — ABNORMAL HIGH (ref 8.9–10.3)
Chloride: 105 mmol/L (ref 98–111)
Creatinine, Ser: 1.01 mg/dL — ABNORMAL HIGH (ref 0.44–1.00)
GFR, Estimated: 57 mL/min — ABNORMAL LOW
Glucose, Bld: 131 mg/dL — ABNORMAL HIGH (ref 70–99)
Potassium: 4.2 mmol/L (ref 3.5–5.1)
Sodium: 136 mmol/L (ref 135–145)
Total Bilirubin: 0.6 mg/dL (ref 0.3–1.2)
Total Protein: 7.4 g/dL (ref 6.5–8.1)

## 2022-06-21 LAB — SURGICAL PCR SCREEN
MRSA, PCR: NEGATIVE
Staphylococcus aureus: NEGATIVE

## 2022-06-21 NOTE — Patient Instructions (Addendum)
Your procedure is scheduled on:07-02-22 Monday Report to the Registration Desk on the 1st floor of the [facility].Then proceed to the 2nd floor Surgery Desk To find out your arrival time, please call [phone] between 1PM - 3PM on:06-29-22 Friday If your arrival time is 6:00 am, do not arrive prior to that time as the [facility] entrance doors do not open until 6:00 am.  REMEMBER: Instructions that are not followed completely may result in serious medical risk, up to and including death; or upon the discretion of your surgeon and anesthesiologist your surgery may need to be rescheduled.  Do not eat food after midnight the night before surgery.  No gum chewing, lozengers or hard candies.  You may however, drink Water up to 2 hours before you are scheduled to arrive for your surgery. Do not drink anything within 2 hours of your scheduled arrival time.  Type 1 and Type 2 diabetics should only drink water.  In addition, your doctor has ordered for you to drink the provided  Gatorade G2 Drinking this carbohydrate drink up to two hours before surgery helps to reduce insulin resistance and improve patient outcomes. Please complete drinking 2 hours prior to scheduled arrival time.  TAKE THESE MEDICATIONS THE MORNING OF SURGERY WITH A SIP OF WATER: -omeprazole (PRILOSEC) - helps to prevent nausea after surgery.)  Stop metFORMIN (GLUCOPHAGE-XR) 2 days prior to surgery-Last dose will be on 06-29-22 Friday  Stop Mounjaro 7 days prior to surgery-Last dose was on 06-19-22-Do NOT take Mounjaro again until AFTER surgery  One week prior to surgery: Stop Anti-inflammatories (NSAIDS) such as Advil, Aleve, Ibuprofen, Motrin, Naproxen, Naprosyn and Aspirin based products such as Excedrin, Goodys Powder, BC Powder.You may however, take Tylenol if needed for pain up until the day of surgery.  Stop ANY OVER THE COUNTER supplements/vitamins 7 days prior to surgery until after surgery (Vitamin D)  No  Alcohol for 24 hours before or after surgery.  No Smoking including e-cigarettes for 24 hours prior to surgery.  No chewable tobacco products for at least 6 hours prior to surgery.  No nicotine patches on the day of surgery.  Do not use any "recreational" drugs for at least a week prior to your surgery.  Please be advised that the combination of cocaine and anesthesia may have negative outcomes, up to and including death. If you test positive for cocaine, your surgery will be cancelled.  On the morning of surgery brush your teeth with toothpaste and water, you may rinse your mouth with mouthwash if you wish. Do not swallow any toothpaste or mouthwash.  Use CHG Soap as directed on instruction sheet.  Do not wear jewelry, make-up, hairpins, clips or nail polish.  Do not wear lotions, powders, or perfumes.   Do not shave body from the neck down 48 hours prior to surgery just in case you cut yourself which could leave a site for infection.  Also, freshly shaved skin may become irritated if using the CHG soap.  Contact lenses, hearing aids and dentures may not be worn into surgery.  Do not bring valuables to the hospital. [facility] is not responsible for any missing/lost belongings or valuables.   Notify your doctor if there is any change in your medical condition (cold, fever, infection).  Wear comfortable clothing (specific to your surgery type) to the hospital.  After surgery, you can help prevent lung complications by doing breathing exercises.  Take deep breaths and cough every 1-2 hours. Your doctor may  order a device called an Incentive Spirometer to help you take deep breaths. When coughing or sneezing, hold a pillow firmly against your incision with both hands. This is called "splinting." Doing this helps protect your incision. It also decreases belly discomfort.  If you are being admitted to the hospital overnight, leave your suitcase in the car. After surgery it may be  brought to your room.  If you are being discharged the day of surgery, you will not be allowed to drive home. You will need a responsible adult (18 years or older) to drive you home and stay with you that night.   If you are taking public transportation, you will need to have a responsible adult (18 years or older) with you. Please confirm with your physician that it is acceptable to use public transportation.   Please call the [facility] at [phone] if you have any questions about these instructions.  Surgery Visitation Policy:  Patients undergoing a surgery or procedure may have two family members or support persons with them as long as the person is not COVID-19 positive or experiencing its symptoms.   Inpatient Visitation:    Visiting hours are 7 a.m. to 8 p.m. Up to four visitors are allowed at one time in a patient room. The visitors may rotate out with other people during the day. One designated support person (adult) may remain overnight.  Due to an increase in RSV and influenza rates and associated hospitalizations, children ages 60 and under will not be able to visit patients in [facility]. Masks continue to be strongly recommended.   How to Use an Incentive Spirometer An incentive spirometer is a tool that measures how well you are filling your lungs with each breath. Learning to take long, deep breaths using this tool can help you keep your lungs clear and active. This may help to reverse or lessen your chance of developing breathing (pulmonary) problems, especially infection. You may be asked to use a spirometer: After a surgery. If you have a lung problem or a history of smoking. After a long period of time when you have been unable to move or be active. If the spirometer includes an indicator to show the highest number that you have reached, your health care provider or respiratory therapist will help you set a goal. Keep a log of your  progress as told by your health care provider. What are the risks? Breathing too quickly may cause dizziness or cause you to pass out. Take your time so you do not get dizzy or light-headed. If you are in pain, you may need to take pain medicine before doing incentive spirometry. It is harder to take a deep breath if you are having pain. How to use your incentive spirometer  Sit up on the edge of your bed or on a chair. Hold the incentive spirometer so that it is in an upright position. Before you use the spirometer, breathe out normally. Place the mouthpiece in your mouth. Make sure your lips are closed tightly around it. Breathe in slowly and as deeply as you can through your mouth, causing the piston or the ball to rise toward the top of the chamber. Hold your breath for 3-5 seconds, or for as long as possible. If the spirometer includes a coach indicator, use this to guide you in breathing. Slow down your breathing if the indicator goes above the marked areas. Remove the mouthpiece from your mouth and breathe out normally.  The piston or ball will return to the bottom of the chamber. Rest for a few seconds, then repeat the steps 10 or more times. Take your time and take a few normal breaths between deep breaths so that you do not get dizzy or light-headed. Do this every 1-2 hours when you are awake. If the spirometer includes a goal marker to show the highest number you have reached (best effort), use this as a goal to work toward during each repetition. After each set of 10 deep breaths, cough a few times. This will help to make sure that your lungs are clear. If you have an incision on your chest or abdomen from surgery, place a pillow or a rolled-up towel firmly against the incision when you cough. This can help to reduce pain while taking deep breaths and coughing. General tips When you are able to get out of bed: Walk around often. Continue to take deep breaths and cough in order to  clear your lungs. Keep using the incentive spirometer until your health care provider says it is okay to stop using it. If you have been in the hospital, you may be told to keep using the spirometer at home. Contact a health care provider if: You are having difficulty using the spirometer. You have trouble using the spirometer as often as instructed. Your pain medicine is not giving enough relief for you to use the spirometer as told. You have a fever. Get help right away if: You develop shortness of breath. You develop a cough with bloody mucus from the lungs. You have fluid or blood coming from an incision site after you cough. Summary An incentive spirometer is a tool that can help you learn to take long, deep breaths to keep your lungs clear and active. You may be asked to use a spirometer after a surgery, if you have a lung problem or a history of smoking, or if you have been inactive for a long period of time. Use your incentive spirometer as instructed every 1-2 hours while you are awake. If you have an incision on your chest or abdomen, place a pillow or a rolled-up towel firmly against your incision when you cough. This will help to reduce pain. Get help right away if you have shortness of breath, you cough up bloody mucus, or blood comes from your incision when you cough. This information is not intended to replace advice given to you by your health care provider. Make sure you discuss any questions you have with your health care provider. Document Revised: 08/10/2019 Document Reviewed: 08/10/2019 Elsevier Patient Education  [place].

## 2022-06-22 LAB — URINE CULTURE: Culture: >=100000 — AB

## 2022-06-25 ENCOUNTER — Telehealth: Payer: Self-pay | Admitting: Urgent Care

## 2022-06-25 DIAGNOSIS — Z01812 Encounter for preprocedural laboratory examination: Secondary | ICD-10-CM

## 2022-06-25 DIAGNOSIS — N390 Urinary tract infection, site not specified: Secondary | ICD-10-CM

## 2022-06-25 MED ORDER — AMOXICILLIN 500 MG PO TABS: 500.0000 mg | ORAL_TABLET | Freq: Three times a day (TID) | ORAL | 0 refills | Status: AC

## 2022-06-25 NOTE — Progress Notes (Signed)
[facility] Perioperative Services: Pre-Admission/Anesthesia Testing  Abnormal Lab Notification and Treatment Plan of Care   Date: 06/25/22  Name: Charlotte Burns MRN:   [id number]  Re: Abnormal labs noted during PAT appointment   Notified:  Provider Name Provider Role Notification Mode  Steffanie Rainwater, MD Orthopedics (Surgeon) Routed and/or faxed via [facility]   Abnormal Lab Value(s):   Lab Results  Component Value Date   COLORURINE YELLOW (A) 06/21/2022   APPEARANCEUR CLOUDY (A) 06/21/2022   LABSPEC 1.021 06/21/2022   PHURINE 5.0 06/21/2022   GLUCOSEU NEGATIVE 06/21/2022   HGBUR SMALL (A) 06/21/2022   BILIRUBINUR NEGATIVE 06/21/2022   KETONESUR NEGATIVE 06/21/2022   PROTEINUR NEGATIVE 06/21/2022   UROBILINOGEN 0.2 01/19/2014   NITRITE NEGATIVE 06/21/2022   LEUKOCYTESUR LARGE (A) 06/21/2022   EPIU 6-10 06/21/2022   WBCU >50 (H) 06/21/2022   RBCU 11-20 06/21/2022   BACTERIA NONE SEEN 06/21/2022   CULT (A) 06/21/2022    >=100,000 COLONIES/mL GROUP B STREP(S.AGALACTIAE)ISOLATED TESTING AGAINST S. AGALACTIAE NOT ROUTINELY PERFORMED DUE TO PREDICTABILITY OF AMP/PEN/VAN SUSCEPTIBILITY. MANY SPECIES, PREDOMINANT ORGANISM WORKED UP  Performed at [facility], [place] [address], [place], [place] [address]    Clinical Information and Notes:  Patient is scheduled for an elective RIGHT TOTAL KNEE ARTHROPLASTY on 07/02/2022 with Dr. Steffanie Rainwater, MD  UA performed in PAT consistent with/concerning for infection.  No leukocytosis noted on CBC; WBC 7.8 Renal function: Estimated Creatinine Clearance: 51.7 mL/min (A) (by C-G formula based on SCr of 1.01 mg/dL (H)). Urine C&S added to assess for pathogenically significant growth.  Impression and Plan:  Charlotte Burns with a UA that was (+) for infection; reflex culture sent that resulted as (+) for GBS. Contacted patient to discuss. Patient reporting that she is experiencing urinary frequency,  urgency, and some mild suprapubic pain. She denies dysuria, gross hematuria, nausea, vomiting, fever/chills. Patient with surgery scheduled soon. In efforts to avoid delaying patient's procedure, or have her experience any potentially significant perioperative complications related to the aforementioned, I would like to proceed with treatment for urinary tract infection.  Allergies reviewed. Culture report also reviewed to ensure culture appropriate coverage is being provided. Will treat with a 5 day course of AMOXICILLIN. Patient encouraged to complete the entire course of antibiotics even if she begins to feel better.   Meds ordered this encounter  Medications   amoxicillin (AMOXIL) 500 MG tablet    Sig: Take 1 tablet (500 mg total) by mouth 3 (three) times daily for 5 days. Increase WATER intake while taking this medication.    Dispense:  15 tablet    Refill:  0   Patient encouraged to increase her fluid intake as much as possible. Discussed that water is always best to flush the urinary tract. She was advised to avoid caffeine containing fluids until her infections clears, as caffeine can cause her to experience painful bladder spasms.   May use Tylenol as needed for pain/fever should she experience these symptoms.   Patient instructed to call surgeon's office or PAT with any questions or concerns related to the above outlined course of treatment. Additionally, she was instructed to call if she feels like she is getting worse overall while on treatment. Results and treatment plan of care forwarded to primary attending surgeon to make them aware.   Encounter Diagnoses  Name Primary?   Pre-operative laboratory examination Yes   Group B streptococcal UTI    Honor Loh, MSN, APRN, FNP-C, CEN [facility]  Peri-operative Services Nurse Practitioner Phone: [phone] Fax: [phone] 06/25/22 9:55 AM  NOTE: This note has been prepared using Dragon dictation  software. Despite my best ability to proofread, there is always the potential that unintentional transcriptional errors may still occur from this process.

## 2022-07-02 ENCOUNTER — Observation Stay
Admission: RE | Admit: 2022-07-02 | Discharge: 2022-07-03 | Disposition: A | Discharge status: Home / Self Care | Payer: Medicare HMO | Source: Ambulatory Visit | Attending: Orthopedic Surgery | Admitting: Orthopedic Surgery

## 2022-07-02 ENCOUNTER — Observation Stay: Payer: Medicare HMO

## 2022-07-02 ENCOUNTER — Encounter: Payer: Self-pay | Admitting: Orthopedic Surgery

## 2022-07-02 ENCOUNTER — Other Ambulatory Visit: Payer: Self-pay

## 2022-07-02 ENCOUNTER — Ambulatory Visit: Payer: Medicare HMO | Admitting: Urgent Care

## 2022-07-02 ENCOUNTER — Encounter
Admission: RE | Disposition: A | Discharge status: Home / Self Care | Payer: Self-pay | Source: Ambulatory Visit | Attending: Orthopedic Surgery

## 2022-07-02 DIAGNOSIS — Z01810 Encounter for preprocedural cardiovascular examination: Secondary | ICD-10-CM

## 2022-07-02 DIAGNOSIS — I10 Essential (primary) hypertension: Secondary | ICD-10-CM | POA: Insufficient documentation

## 2022-07-02 DIAGNOSIS — Z96611 Presence of right artificial shoulder joint: Secondary | ICD-10-CM | POA: Insufficient documentation

## 2022-07-02 DIAGNOSIS — Z8673 Personal history of transient ischemic attack (TIA), and cerebral infarction without residual deficits: Secondary | ICD-10-CM | POA: Diagnosis not present

## 2022-07-02 DIAGNOSIS — Z79899 Other long term (current) drug therapy: Secondary | ICD-10-CM | POA: Insufficient documentation

## 2022-07-02 DIAGNOSIS — Z96643 Presence of artificial hip joint, bilateral: Secondary | ICD-10-CM | POA: Diagnosis not present

## 2022-07-02 DIAGNOSIS — Z87891 Personal history of nicotine dependence: Secondary | ICD-10-CM | POA: Diagnosis not present

## 2022-07-02 DIAGNOSIS — Z01812 Encounter for preprocedural laboratory examination: Secondary | ICD-10-CM

## 2022-07-02 DIAGNOSIS — E119 Type 2 diabetes mellitus without complications: Secondary | ICD-10-CM | POA: Insufficient documentation

## 2022-07-02 DIAGNOSIS — Z96651 Presence of right artificial knee joint: Secondary | ICD-10-CM

## 2022-07-02 DIAGNOSIS — Z96652 Presence of left artificial knee joint: Secondary | ICD-10-CM | POA: Insufficient documentation

## 2022-07-02 DIAGNOSIS — Z7982 Long term (current) use of aspirin: Secondary | ICD-10-CM | POA: Insufficient documentation

## 2022-07-02 DIAGNOSIS — Z96619 Presence of unspecified artificial shoulder joint: Secondary | ICD-10-CM

## 2022-07-02 DIAGNOSIS — Z471 Aftercare following joint replacement surgery: Secondary | ICD-10-CM | POA: Diagnosis not present

## 2022-07-02 DIAGNOSIS — R0602 Shortness of breath: Secondary | ICD-10-CM

## 2022-07-02 DIAGNOSIS — Z7984 Long term (current) use of oral hypoglycemic drugs: Secondary | ICD-10-CM | POA: Diagnosis not present

## 2022-07-02 DIAGNOSIS — Z01818 Encounter for other preprocedural examination: Secondary | ICD-10-CM

## 2022-07-02 DIAGNOSIS — M1711 Unilateral primary osteoarthritis, right knee: Secondary | ICD-10-CM | POA: Diagnosis not present

## 2022-07-02 HISTORY — PX: TOTAL KNEE ARTHROPLASTY: SHX125

## 2022-07-02 LAB — GLUCOSE, CAPILLARY
Glucose-Capillary: 243 mg/dL — ABNORMAL HIGH (ref 70–99)
Glucose-Capillary: 192 mg/dL — ABNORMAL HIGH (ref 70–99)
Glucose-Capillary: 143 mg/dL — ABNORMAL HIGH (ref 70–99)
Glucose-Capillary: 227 mg/dL — ABNORMAL HIGH (ref 70–99)
Glucose-Capillary: 305 mg/dL — ABNORMAL HIGH (ref 70–99)

## 2022-07-02 SURGERY — ARTHROPLASTY, KNEE, TOTAL
Anesthesia: Spinal | Site: Knee | Laterality: Right

## 2022-07-02 MED ORDER — OXYCODONE HCL 5 MG PO TABS: 5.0000 mg | ORAL_TABLET | Freq: Once | ORAL | Status: AC | PRN

## 2022-07-02 MED ORDER — INSULIN ASPART 100 UNIT/ML IJ SOLN
0.0000 [IU] | Freq: Three times a day (TID) | INTRAMUSCULAR | Status: DC
  Administered 2022-07-02: 8 [IU] via SUBCUTANEOUS
  Administered 2022-07-03: 5 [IU] via SUBCUTANEOUS
  Filled 2022-07-02 (×2): qty 1

## 2022-07-02 MED ORDER — MENTHOL 3 MG MT LOZG: 1.0000 | LOZENGE | OROMUCOSAL | Status: DC | PRN

## 2022-07-02 MED ORDER — FENTANYL CITRATE (PF) 100 MCG/2ML IJ SOLN
INTRAMUSCULAR | Status: DC | PRN
  Administered 2022-07-02 (×3): 25 ug via INTRAVENOUS

## 2022-07-02 MED ORDER — TRAMADOL HCL 50 MG PO TABS: 50.0000 mg | ORAL_TABLET | Freq: Four times a day (QID) | ORAL | Status: DC | PRN

## 2022-07-02 MED ORDER — ENOXAPARIN SODIUM 30 MG/0.3ML IJ SOSY
30.0000 mg | PREFILLED_SYRINGE | Freq: Two times a day (BID) | INTRAMUSCULAR | Status: DC
  Administered 2022-07-03: 30 mg via SUBCUTANEOUS
  Filled 2022-07-02: qty 0.3

## 2022-07-02 MED ORDER — SODIUM CHLORIDE 0.9 % IR SOLN
Status: DC | PRN
  Administered 2022-07-02: 3000 mL

## 2022-07-02 MED ORDER — ROSUVASTATIN CALCIUM 5 MG PO TABS
5.0000 mg | ORAL_TABLET | Freq: Every evening | ORAL | Status: DC
  Administered 2022-07-02: 5 mg via ORAL
  Filled 2022-07-02: qty 1

## 2022-07-02 MED ORDER — SODIUM CHLORIDE 0.9 % IV SOLN: INTRAVENOUS | Status: DC

## 2022-07-02 MED ORDER — MORPHINE SULFATE (PF) 2 MG/ML IV SOLN: 0.5000 mg | INTRAVENOUS | Status: DC | PRN

## 2022-07-02 MED ORDER — TRANEXAMIC ACID-NACL 1000-0.7 MG/100ML-% IV SOLN
1000.0000 mg | INTRAVENOUS | Status: AC
  Administered 2022-07-02: 1000 mg via INTRAVENOUS

## 2022-07-02 MED ORDER — CEFAZOLIN SODIUM-DEXTROSE 2-4 GM/100ML-% IV SOLN
INTRAVENOUS | Status: AC
  Filled 2022-07-02: qty 100

## 2022-07-02 MED ORDER — BUPIVACAINE HCL (PF) 0.5 % IJ SOLN
INTRAMUSCULAR | Status: DC | PRN
  Administered 2022-07-02: 2.6 mL

## 2022-07-02 MED ORDER — INSULIN ASPART 100 UNIT/ML IJ SOLN
INTRAMUSCULAR | Status: AC
  Administered 2022-07-02: 5 [IU] via SUBCUTANEOUS
  Filled 2022-07-02: qty 1

## 2022-07-02 MED ORDER — CEFAZOLIN SODIUM-DEXTROSE 2-4 GM/100ML-% IV SOLN
2.0000 g | INTRAVENOUS | Status: AC
  Administered 2022-07-02: 2 g via INTRAVENOUS

## 2022-07-02 MED ORDER — DEXAMETHASONE SODIUM PHOSPHATE 10 MG/ML IJ SOLN
INTRAMUSCULAR | Status: AC
  Filled 2022-07-02: qty 1

## 2022-07-02 MED ORDER — PROPOFOL 500 MG/50ML IV EMUL
INTRAVENOUS | Status: DC | PRN
  Administered 2022-07-02: 100 ug/kg/min via INTRAVENOUS

## 2022-07-02 MED ORDER — CHLORHEXIDINE GLUCONATE 0.12 % MT SOLN: 15.0000 mL | Freq: Once | OROMUCOSAL | Status: AC

## 2022-07-02 MED ORDER — PROPOFOL 10 MG/ML IV BOLUS
INTRAVENOUS | Status: AC
  Filled 2022-07-02: qty 20

## 2022-07-02 MED ORDER — MIDAZOLAM HCL 2 MG/2ML IJ SOLN
INTRAMUSCULAR | Status: AC
  Filled 2022-07-02: qty 2

## 2022-07-02 MED ORDER — GABAPENTIN 300 MG PO CAPS
300.0000 mg | ORAL_CAPSULE | Freq: Every evening | ORAL | Status: DC
  Administered 2022-07-02: 300 mg via ORAL
  Filled 2022-07-02: qty 1

## 2022-07-02 MED ORDER — ONDANSETRON HCL 4 MG/2ML IJ SOLN
INTRAMUSCULAR | Status: AC
  Filled 2022-07-02: qty 2

## 2022-07-02 MED ORDER — ONDANSETRON HCL 4 MG PO TABS: 4.0000 mg | ORAL_TABLET | Freq: Four times a day (QID) | ORAL | Status: DC | PRN

## 2022-07-02 MED ORDER — PANTOPRAZOLE SODIUM 40 MG PO TBEC
40.0000 mg | DELAYED_RELEASE_TABLET | Freq: Every day | ORAL | Status: DC
  Administered 2022-07-02 – 2022-07-03 (×2): 40 mg via ORAL
  Filled 2022-07-02 (×2): qty 1

## 2022-07-02 MED ORDER — PHENOL 1.4 % MT LIQD: 1.0000 | OROMUCOSAL | Status: DC | PRN

## 2022-07-02 MED ORDER — PROPOFOL 1000 MG/100ML IV EMUL
INTRAVENOUS | Status: AC
  Filled 2022-07-02: qty 100

## 2022-07-02 MED ORDER — PHENYLEPHRINE HCL-NACL 20-0.9 MG/250ML-% IV SOLN
INTRAVENOUS | Status: DC | PRN
  Administered 2022-07-02: 20 ug/min via INTRAVENOUS

## 2022-07-02 MED ORDER — INSULIN ASPART 100 UNIT/ML IJ SOLN
0.0000 [IU] | Freq: Every day | INTRAMUSCULAR | Status: DC
  Administered 2022-07-02: 4 [IU] via SUBCUTANEOUS
  Filled 2022-07-02: qty 1

## 2022-07-02 MED ORDER — DULOXETINE HCL 30 MG PO CPEP
60.0000 mg | ORAL_CAPSULE | Freq: Every evening | ORAL | Status: DC
  Administered 2022-07-02: 60 mg via ORAL
  Filled 2022-07-02: qty 2

## 2022-07-02 MED ORDER — BUPIVACAINE LIPOSOME 1.3 % IJ SUSP
INTRAMUSCULAR | Status: AC
  Filled 2022-07-02: qty 20

## 2022-07-02 MED ORDER — HYDROCODONE-ACETAMINOPHEN 5-325 MG PO TABS
1.0000 | ORAL_TABLET | ORAL | Status: DC | PRN
  Administered 2022-07-02 – 2022-07-03 (×2): 1 via ORAL
  Filled 2022-07-02: qty 1
  Filled 2022-07-02: qty 2

## 2022-07-02 MED ORDER — ONDANSETRON HCL 4 MG/2ML IJ SOLN: 4.0000 mg | Freq: Four times a day (QID) | INTRAMUSCULAR | Status: DC | PRN

## 2022-07-02 MED ORDER — TRANEXAMIC ACID-NACL 1000-0.7 MG/100ML-% IV SOLN
INTRAVENOUS | Status: AC
  Filled 2022-07-02: qty 100

## 2022-07-02 MED ORDER — ACETAMINOPHEN 10 MG/ML IV SOLN: 1000.0000 mg | Freq: Once | INTRAVENOUS | Status: DC | PRN

## 2022-07-02 MED ORDER — FENTANYL CITRATE (PF) 100 MCG/2ML IJ SOLN
INTRAMUSCULAR | Status: AC
  Filled 2022-07-02: qty 2

## 2022-07-02 MED ORDER — FENTANYL CITRATE (PF) 100 MCG/2ML IJ SOLN: 25.0000 ug | INTRAMUSCULAR | Status: DC | PRN

## 2022-07-02 MED ORDER — IRRISEPT - 450ML BOTTLE WITH 0.05% CHG IN STERILE WATER, USP 99.95% OPTIME
TOPICAL | Status: DC | PRN
  Administered 2022-07-02: 450 mL

## 2022-07-02 MED ORDER — METOCLOPRAMIDE HCL 5 MG PO TABS: 5.0000 mg | ORAL_TABLET | Freq: Three times a day (TID) | ORAL | Status: DC | PRN

## 2022-07-02 MED ORDER — CHLORHEXIDINE GLUCONATE 0.12 % MT SOLN
OROMUCOSAL | Status: AC
  Administered 2022-07-02: 15 mL via OROMUCOSAL
  Filled 2022-07-02: qty 15

## 2022-07-02 MED ORDER — 0.9 % SODIUM CHLORIDE (POUR BTL) OPTIME
TOPICAL | Status: DC | PRN
  Administered 2022-07-02: 500 mL

## 2022-07-02 MED ORDER — EPINEPHRINE PF 1 MG/ML IJ SOLN
INTRAMUSCULAR | Status: AC
  Filled 2022-07-02: qty 1

## 2022-07-02 MED ORDER — ONDANSETRON HCL 4 MG/2ML IJ SOLN
INTRAMUSCULAR | Status: DC | PRN
  Administered 2022-07-02: 4 mg via INTRAVENOUS

## 2022-07-02 MED ORDER — DOCUSATE SODIUM 100 MG PO CAPS
100.0000 mg | ORAL_CAPSULE | Freq: Two times a day (BID) | ORAL | Status: DC
  Administered 2022-07-02 – 2022-07-03 (×2): 100 mg via ORAL
  Filled 2022-07-02 (×2): qty 1

## 2022-07-02 MED ORDER — PHENYLEPHRINE HCL-NACL 20-0.9 MG/250ML-% IV SOLN
INTRAVENOUS | Status: AC
  Filled 2022-07-02: qty 250

## 2022-07-02 MED ORDER — METOCLOPRAMIDE HCL 5 MG/ML IJ SOLN: 5.0000 mg | Freq: Three times a day (TID) | INTRAMUSCULAR | Status: DC | PRN

## 2022-07-02 MED ORDER — SODIUM CHLORIDE (PF) 0.9 % IJ SOLN
INTRAMUSCULAR | Status: DC | PRN
  Administered 2022-07-02: 71 mL via INTRAMUSCULAR

## 2022-07-02 MED ORDER — EPHEDRINE SULFATE (PRESSORS) 50 MG/ML IJ SOLN
INTRAMUSCULAR | Status: DC | PRN
  Administered 2022-07-02 (×4): 5 mg via INTRAVENOUS

## 2022-07-02 MED ORDER — IRBESARTAN 150 MG PO TABS
150.0000 mg | ORAL_TABLET | Freq: Every day | ORAL | Status: DC
  Administered 2022-07-02 – 2022-07-03 (×2): 150 mg via ORAL
  Filled 2022-07-02 (×2): qty 1

## 2022-07-02 MED ORDER — INSULIN ASPART 100 UNIT/ML IJ SOLN: 5.0000 [IU] | Freq: Once | INTRAMUSCULAR | Status: AC

## 2022-07-02 MED ORDER — BUPIVACAINE HCL (PF) 0.25 % IJ SOLN
INTRAMUSCULAR | Status: AC
  Filled 2022-07-02: qty 30

## 2022-07-02 MED ORDER — OXYCODONE HCL 5 MG/5ML PO SOLN: 5.0000 mg | Freq: Once | ORAL | Status: AC | PRN

## 2022-07-02 MED ORDER — LACTATED RINGERS IV SOLN: INTRAVENOUS | Status: DC

## 2022-07-02 MED ORDER — PROPOFOL 10 MG/ML IV BOLUS
INTRAVENOUS | Status: DC | PRN
  Administered 2022-07-02: 30 mg via INTRAVENOUS

## 2022-07-02 MED ORDER — ONDANSETRON HCL 4 MG/2ML IJ SOLN: 4.0000 mg | Freq: Once | INTRAMUSCULAR | Status: DC | PRN

## 2022-07-02 MED ORDER — CEFAZOLIN SODIUM-DEXTROSE 2-4 GM/100ML-% IV SOLN
2.0000 g | Freq: Four times a day (QID) | INTRAVENOUS | Status: AC
  Administered 2022-07-02 (×2): 2 g via INTRAVENOUS
  Filled 2022-07-02 (×2): qty 100

## 2022-07-02 MED ORDER — DEXAMETHASONE SODIUM PHOSPHATE 10 MG/ML IJ SOLN
8.0000 mg | Freq: Once | INTRAMUSCULAR | Status: AC
  Administered 2022-07-02: 8 mg via INTRAVENOUS

## 2022-07-02 MED ORDER — STERILE WATER FOR IRRIGATION IR SOLN
Status: DC | PRN
  Administered 2022-07-02: 1000 mL

## 2022-07-02 MED ORDER — KETOROLAC TROMETHAMINE 15 MG/ML IJ SOLN
7.5000 mg | Freq: Four times a day (QID) | INTRAMUSCULAR | Status: DC
  Administered 2022-07-02 – 2022-07-03 (×3): 7.5 mg via INTRAVENOUS
  Filled 2022-07-02 (×3): qty 1

## 2022-07-02 MED ORDER — KETAMINE HCL 50 MG/5ML IJ SOSY
PREFILLED_SYRINGE | INTRAMUSCULAR | Status: AC
  Filled 2022-07-02: qty 5

## 2022-07-02 MED ORDER — OXYCODONE HCL 5 MG PO TABS
ORAL_TABLET | ORAL | Status: AC
  Administered 2022-07-02: 5 mg via ORAL
  Filled 2022-07-02: qty 1

## 2022-07-02 MED ORDER — ORAL CARE MOUTH RINSE: 15.0000 mL | Freq: Once | OROMUCOSAL | Status: AC

## 2022-07-02 MED ORDER — ACETAMINOPHEN 10 MG/ML IV SOLN
INTRAVENOUS | Status: AC
  Filled 2022-07-02: qty 100

## 2022-07-02 MED ORDER — KETOROLAC TROMETHAMINE 30 MG/ML IJ SOLN
INTRAMUSCULAR | Status: AC
  Filled 2022-07-02: qty 1

## 2022-07-02 MED ORDER — ACETAMINOPHEN 500 MG PO TABS
1000.0000 mg | ORAL_TABLET | Freq: Three times a day (TID) | ORAL | Status: DC
  Administered 2022-07-02 – 2022-07-03 (×2): 1000 mg via ORAL
  Filled 2022-07-02 (×2): qty 2

## 2022-07-02 SURGICAL SUPPLY — 79 items
BLADE PATELLA REAM PILOT HOLE (MISCELLANEOUS) IMPLANT
BLADE SAW SAG 25X90X1.19 (BLADE) ×1 IMPLANT
BLADE SAW SAG 29X58X.64 (BLADE) ×1 IMPLANT
BNDG ELASTIC 6X5.8 VLCR STR LF (GAUZE/BANDAGES/DRESSINGS) ×1 IMPLANT
BOWL CEMENT MIX W/ADAPTER (MISCELLANEOUS) ×1 IMPLANT
CEMENT BONE R 1X40 (Cement) ×2 IMPLANT
CHLORAPREP W/TINT 26 (MISCELLANEOUS) ×2 IMPLANT
COMP FEM CMT PERSONA SZ7 RT (Joint) ×1 IMPLANT
COMPONENT FEM CMT PRSONA SZ7RT (Joint) IMPLANT
COOLER POLAR GLACIER W/PUMP (MISCELLANEOUS) ×1 IMPLANT
CUFF TOURN SGL QUICK 24 (TOURNIQUET CUFF)
CUFF TOURN SGL QUICK 34 (TOURNIQUET CUFF)
CUFF TRNQT CYL 24X4X16.5-23 (TOURNIQUET CUFF) IMPLANT
CUFF TRNQT CYL 34X4.125X (TOURNIQUET CUFF) IMPLANT
DERMABOND ADVANCED .7 DNX12 (GAUZE/BANDAGES/DRESSINGS) ×1 IMPLANT
DRAPE 3/4 80X56 (DRAPES) ×1 IMPLANT
DRAPE INCISE IOBAN 66X60 STRL (DRAPES) ×1 IMPLANT
DRSG MEPILEX SACRM 8.7X9.8 (GAUZE/BANDAGES/DRESSINGS) ×1 IMPLANT
DRSG OPSITE POSTOP 4X10 (GAUZE/BANDAGES/DRESSINGS) IMPLANT
DRSG OPSITE POSTOP 4X8 (GAUZE/BANDAGES/DRESSINGS) IMPLANT
ELECT CAUTERY BLADE 6.4 (BLADE) ×1 IMPLANT
ELECT REM PT RETURN 9FT ADLT (ELECTROSURGICAL) ×1
ELECTRODE REM PT RTRN 9FT ADLT (ELECTROSURGICAL) ×1 IMPLANT
GLOVE BIO SURGEON STRL SZ8 (GLOVE) ×1 IMPLANT
GLOVE BIOGEL PI IND STRL 8 (GLOVE) ×1 IMPLANT
GLOVE PI ORTHO PRO STRL 7.5 (GLOVE) ×2 IMPLANT
GLOVE PI ORTHO PRO STRL SZ8 (GLOVE) ×2 IMPLANT
GOWN STRL REUS W/ TWL LRG LVL3 (GOWN DISPOSABLE) ×1 IMPLANT
GOWN STRL REUS W/ TWL XL LVL3 (GOWN DISPOSABLE) ×2 IMPLANT
GOWN STRL REUS W/TWL LRG LVL3 (GOWN DISPOSABLE) ×1
GOWN STRL REUS W/TWL XL LVL3 (GOWN DISPOSABLE) ×2
HANDLE YANKAUER SUCT OPEN TIP (MISCELLANEOUS) ×1 IMPLANT
HDLS TROCR DRIL PIN KNEE 75 (PIN) ×1
HOLDER FOLEY CATH W/STRAP (MISCELLANEOUS) ×1 IMPLANT
HOOD PEEL AWAY T7 (MISCELLANEOUS) ×3 IMPLANT
INSERT TIB ARTISURF SZ6-7 R 11 (Joint) IMPLANT
IV NS IRRIG 3000ML ARTHROMATIC (IV SOLUTION) ×1 IMPLANT
JET LAVAGE IRRISEPT WOUND (IRRIGATION / IRRIGATOR) ×1
KIT TURNOVER KIT A (KITS) ×1 IMPLANT
LAVAGE JET IRRISEPT WOUND (IRRIGATION / IRRIGATOR) IMPLANT
MANIFOLD NEPTUNE II (INSTRUMENTS) ×1 IMPLANT
MARKER SKIN DUAL TIP RULER LAB (MISCELLANEOUS) ×1 IMPLANT
MAT ABSORB  FLUID 56X50 GRAY (MISCELLANEOUS)
MAT ABSORB FLUID 56X50 GRAY (MISCELLANEOUS) ×1 IMPLANT
NDL HYPO 21X1.5 SAFETY (NEEDLE) ×1 IMPLANT
NDL SAFETY ECLIP 18X1.5 (MISCELLANEOUS) ×1 IMPLANT
NEEDLE HYPO 21X1.5 SAFETY (NEEDLE) ×1 IMPLANT
NS IRRIG 500ML POUR BTL (IV SOLUTION) ×1 IMPLANT
PACK TOTAL KNEE (MISCELLANEOUS) ×1 IMPLANT
PAD WRAPON POLAR KNEE (MISCELLANEOUS) ×1 IMPLANT
PENCIL SMOKE EVACUATOR (MISCELLANEOUS) ×1 IMPLANT
PIN DRILL HDLS TROCAR 75 4PK (PIN) IMPLANT
PULSAVAC PLUS IRRIG FAN TIP (DISPOSABLE) ×1
SCREW FEMALE HEX FIX 25X2.5 (ORTHOPEDIC DISPOSABLE SUPPLIES) IMPLANT
SCREW HEX HEADED 3.5X27 DISP (ORTHOPEDIC DISPOSABLE SUPPLIES) IMPLANT
SLEEVE SCD COMPRESS KNEE MED (STOCKING) ×1 IMPLANT
SOLUTION IRRIG SURGIPHOR (IV SOLUTION) ×1 IMPLANT
STEM POLY PAT PLY 32M KNEE (Knees) IMPLANT
STEM TIB ST PERS 14+30 (Stem) IMPLANT
STEM TIBIA 5 DEG SZ F R KNEE (Knees) IMPLANT
SUT DVC 2 QUILL PDO  T11 36X36 (SUTURE) ×1
SUT DVC 2 QUILL PDO T11 36X36 (SUTURE) ×1 IMPLANT
SUT QUILL MONODERM 3-0 PS-2 (SUTURE) ×1 IMPLANT
SUT VIC AB 0 CT1 36 (SUTURE) ×1 IMPLANT
SUT VIC AB 2-0 CT2 27 (SUTURE) ×2 IMPLANT
SUT VICRYL 1-0 27IN ABS (SUTURE) ×1
SUTURE VICRYL 1-0 27IN ABS (SUTURE) ×1 IMPLANT
SYR 10ML LL (SYRINGE) ×1 IMPLANT
SYR 30ML LL (SYRINGE) ×2 IMPLANT
SYR 3ML LL SCALE MARK (SYRINGE) ×1 IMPLANT
TAPE CLOTH 3X10 WHT NS LF (GAUZE/BANDAGES/DRESSINGS) ×1 IMPLANT
TIBIA STEM 5 DEG SZ F R KNEE (Knees) ×1 IMPLANT
TIP FAN IRRIG PULSAVAC PLUS (DISPOSABLE) ×1 IMPLANT
TOWEL OR 17X26 4PK STRL BLUE (TOWEL DISPOSABLE) IMPLANT
TRAP FLUID SMOKE EVACUATOR (MISCELLANEOUS) ×1 IMPLANT
TRAY FOLEY SLVR 16FR LF STAT (SET/KITS/TRAYS/PACK) ×1 IMPLANT
TUBE KAMVAC SUCTION (TUBING) IMPLANT
WATER STERILE IRR 1000ML POUR (IV SOLUTION) ×1 IMPLANT
WRAPON POLAR PAD KNEE (MISCELLANEOUS) ×1

## 2022-07-02 NOTE — H&P (Signed)
History of Present Illness: The patient is an 77 y.o. female seen in clinic today for right knee evaluation. The patient reports she has had pain in her right knee on and off for the last 10 years which increased about 1 year ago. She reports she had a steroid injection about 6 months ago from family medicine and reports a few months of relief from that injection without the pain returned and is significant. At its worst the pain is up to a 7/10 over the anterior and lateral side of the knee. She has used topical anti-inflammatories as well as oral anti-inflammatories with minimal relief. She reports the pain has become a functional issue for her and her knee is preventing her from participating in activities of daily living and requiring her to use assistive devices for ambulation including canes and walkers. The patient denies fevers, chills, numbness, tingling, shortness of breath, chest pain, recent illness, or any trauma.  Patient is a non-smoker BMI 31.86 The patient is a diabetic and her A1c has fluctuated up and down over the last few years with the most recent over 6 months ago at this 8.9 previous was 7.4.  Past Medical History: Past Medical History: Diagnosis Date Acid reflux Arthritis Diabetes mellitus, type II (CMS-HCC) Hyperparathyroidism (CMS-HCC) Osteoarthritis Postmenopausal  Past Surgical History: Past Surgical History: Procedure Laterality Date REPLACEMENT TOTAL HIP W/ RESURFACING IMPLANTS Right 02/29/2012 REPLACEMENT TOTAL HIP W/ RESURFACING IMPLANTS Left 2014 COLONOSCOPY 04/2013 CATARACT EXTRACTION Bilateral 2015 REPLACEMENT TOTAL KNEE Left 01/2014 Right reverse total shoulder arthroplasty, right biceps tenodesis Right 02/15/2020 Dr. Posey Pronto  Past Family History: Family History Problem Relation Age of Onset Alzheimer's disease Mother 12 High blood pressure (Hypertension) Mother Diabetes type II Mother Transient ischemic attack Mother Stomach cancer Father Rheum  arthritis Father Heart disease Father Osteoporosis (Thinning of bones) Father Lupus Sister Rheum arthritis Sister Osteoporosis (Thinning of bones) Sister Stomach cancer Sister Diabetes Sister Liver cancer Brother mets to brain No Known Problems Son No Known Problems Son No Known Problems Son No Known Problems Son No Known Problems Maternal Grandmother No Known Problems Maternal Grandfather No Known Problems Paternal Grandmother No Known Problems Paternal Grandfather  Medications: Current Outpatient Medications Ordered in Epic Medication Sig Dispense Refill aspirin 81 MG EC tablet Take 81 mg by mouth once daily cholecalciferol (VITAMIN D3) 1000 unit tablet Take 2,000 Units by mouth once daily diclofenac (VOLTAREN) 75 MG EC tablet Take 1 tablet (75 mg total) by mouth 2 (two) times daily 180 tablet 3 DULoxetine (CYMBALTA) 60 MG DR capsule Take 1 capsule (60 mg total) by mouth once daily 30 capsule 11 gabapentin (NEURONTIN) 300 MG capsule Take 1 capsule (300 mg total) by mouth at bedtime 90 capsule 3 glipiZIDE (GLUCOTROL XL) 5 MG XL tablet Take 1 tablet (5 mg total) by mouth once daily 30 tablet 11 lancets Use 1 each 2 (two) times daily. Use as instructed. 100 each 10 metFORMIN (GLUCOPHAGE-XR) 500 MG XR tablet Take 2 tablets (1,000 mg total) by mouth daily with dinner 180 tablet 3 olmesartan (BENICAR) 20 MG tablet TAKE 1 TABLET BY MOUTH ONCE DAILY 30 tablet 11 omeprazole (PRILOSEC) 20 MG DR capsule TAKE 1 CAPSULE BY MOUTH ONCE DAILY 30 capsule 11 ondansetron (ZOFRAN-ODT) 4 MG disintegrating tablet DISSOLVE 1 TABLET IN MOUTH EVERY 8 HOURS AS NEEDED FOR NAUSEA AND VOMITING oxyCODONE (ROXICODONE) 5 MG immediate release tablet Take 1 tablet p.o. twice daily as needed severe pain only 10 tablet 0 prednisoLONE acetate (PRED FORTE) 1 % ophthalmic suspension INSTILL  1 DROP INTO LEFT EYE EVERY OTHER DAY predniSONE (DELTASONE) 10 MG tablet Taper 5,5,4,4,3,3,2,2,1,1,off (Patient not taking:  Reported on 06/06/2022) 30 tablet 0 rosuvastatin (CRESTOR) 5 MG tablet Take 1 tablet (5 mg total) by mouth once daily 30 tablet 11 tirzepatide (MOUNJARO) 2.5 mg/0.5 mL PnIj INJECT 2.5 MG SUBCUTANEOUSLY EVERY 7 DAYS 2 mL 5  No current Epic-ordered facility-administered medications on file.  Allergies: No Known Allergies  Review of Systems: A comprehensive 14 point ROS was performed, reviewed, and the pertinent orthopaedic findings are documented in the HPI.  Physical Exam: General/Constitutional: No apparent distress: well-nourished and well developed. Eyes: Pupils equal, round with synchronous movement. Pulmonary exam: Lungs clear to auscultation bilaterally no wheezing rales or rhonchi Cardiac exam: Regular rate and rhythm no obvious murmurs rubs or gallops. Integumentary: No impressive skin lesions present, except as noted in detailed exam. Neuro/Psych: Normal mood and affect, oriented to person, place and time.  Comprehensive Knee Exam: Gait Non-antalgic and fluid Alignment Neutral  Inspection Right Left Skin Normal appearance with no obvious deformity. No ecchymosis or erythema. Well-healed midline incision Soft Tissue No focal soft tissue swelling No focal soft tissue swelling Quad Atrophy None None  Palpation Right Left Tenderness Lateral joint line tenderness and parapatellar tenderness to palpation No peripatellar, patellar tendon, quad tendon, medial/lateral joint line pain Crepitus + patellofemoral and tibiofemoral crepitus No patellofemoral or tibiofemoral crepitus Effusion None None  Range of Motion               Right Left Flexion 5-105 0-115 Extension Full knee extension without hyperextension Full knee extension without hyperextension  Ligamentous Exam Right Left Lachman Normal Normal Valgus 0 Normal Normal Valgus 30 Normal Normal Varus 0 Normal Normal Varus 30 Normal Normal Anterior Drawer Normal Normal Posterior Drawer Normal Normal  Meniscal  Exam Right Left Hyperflexion Test Positive Negative Hyperextension Test Negative Negative McMurray's Positive Negative  Neurovascular Right Left Quadriceps Strength 5/5 5/5 Hamstring Strength 5/5 5/5 Hip Abductor Strength 4/5 4/5 Distal Motor Normal Normal Distal Sensory Normal light touch sensation Normal light touch sensation Distal Pulses Normal Normal   Imaging Studies: I have reviewed AP, lateral,sunrise, and flexed PA weight bearing knee X-rays (4 views) of the right knee ordered and taken today in the office show severe degenerative changes with lateral and patellofemoral joint space narrowing with bone-on-bone articulation, osteophyte formation, subchondral cysts and sclerosis. The right knee is in 12 degrees of valgus on AP weightbearing x-ray. AP, sunrise, and flexed PA of the left knee also show status post total knee replacement with components in appropriate appearing position with no obvious signs of periprosthetic fracture or loosening..  Assessment: ICD-10-CM 1. Acute pain of right knee M25.561 Right knee osteoarthritis  Plan: Charlotte Burns is a 77 year old female who presents with right knee bone on bone arthritis. Based upon the patient's continued symptoms and failure to respond to conservative treatment, I have recommended a right total knee replacement for this patient. A long discussion took place with the patient describing what a total joint replacement is and what the procedure would entail. A knee model, similar to the implants that will be used during the operation, was utilized to demonstrate the implants. Choices of implant manufactures were discussed and reviewed. The ability to secure the implant utilizing cement or cementless (press fit) fixation was discussed. The approach and exposure was discussed.  The hospitalization and post-operative care and rehabilitation were also discussed. The use of perioperative antibiotics and DVT prophylaxis were discussed. The  risk, benefits  and alternatives to a surgical intervention were discussed at length with the patient. The patient was also advised of risks related to the medical comorbidities and elevated body mass index (BMI). A lengthy discussion took place to review the most common complications including but not limited to: stiffness, loss of function, complex regional pain syndrome, deep vein thrombosis, pulmonary embolus, heart attack, stroke, infection, wound breakdown, numbness, damage to nerves, tendon,muscles, arteries or other blood vessels, death and other possible complications from anesthesia. The patient was told that we will take steps to minimize these risks by using sterile technique, antibiotics and DVT prophylaxis when appropriate and follow the patient postoperatively in the office setting to monitor progress. The possibility of recurrent pain, no improvement in pain and actual worsening of pain were also discussed with the patient.  The discharge plan of care focused on the patient going home following surgery. The patient was encouraged to make the necessary arrangements to have someone stay with them when they are discharged home.  The benefits of surgery were discussed with the patient including the potential for improving the patient's current clinical condition through operative intervention. Alternatives to surgical intervention including continued conservative management were also discussed in detail. All questions were answered to the satisfaction of the patient. The patient participated and agreed to the plan of care as well as the use of the recommended implants for their total knee replacement surgery. An information packet was given to the patient to review prior to surgery.  Hemoglobin A1c was taken in clinic today and resulted with 7.0 making her a better candidate for surgery. All questions answered and the patient agrees with the above planning for a right total knee  replacement.  History and physical updated on day of surgery, patient agrees with above plan to proceed with right total knee arthroplasty.   Portions of this record have been created using [employer]. Dictation errors have been sought, but may not have been identified and corrected.  Steffanie Rainwater MD

## 2022-07-02 NOTE — Interval H&P Note (Signed)
Patient history and physical updated. Consent reviewed including risks, benefits, and alternatives to surgery. Patient agrees with above plan to proceed with right total knee arthroplasty  

## 2022-07-02 NOTE — Anesthesia Postprocedure Evaluation (Signed)
Anesthesia Post Note  Patient: Charlotte Burns  Procedure(s) Performed: TOTAL KNEE ARTHROPLASTY (Right: Knee)  Patient location during evaluation: PACU Anesthesia Type: Spinal Level of consciousness: awake and alert, oriented and patient cooperative Pain management: pain level controlled Vital Signs Assessment: post-procedure vital signs reviewed and stable Respiratory status: spontaneous breathing, nonlabored ventilation and respiratory function stable Cardiovascular status: blood pressure returned to baseline and stable Postop Assessment: adequate PO intake, no headache, no backache and spinal receding Anesthetic complications: no   No notable events documented.   Last Vitals:  Vitals:   07/02/22 1400 07/02/22 1415  BP: 123/66 118/71  Pulse: 65 64  Resp: 11 16  Temp:    SpO2: 96% 96%    Last Pain:  Vitals:   07/02/22 1400  TempSrc:   PainSc: 0-No pain                 Darrin Nipper

## 2022-07-02 NOTE — Anesthesia Procedure Notes (Signed)
Spinal  Patient location during procedure: OR Start time: 07/02/2022 11:08 AM End time: 07/02/2022 11:16 AM Reason for block: surgical anesthesia Staffing Performed: anesthesiologist  Anesthesiologist: Darrin Nipper, MD Resident/CRNA: Natasha Mead, CRNA Performed by: Darrin Nipper, MD Authorized by: Darrin Nipper, MD   Preanesthetic Checklist Completed: patient identified, IV checked, site marked, risks and benefits discussed, surgical consent, monitors and equipment checked, pre-op evaluation and timeout performed Spinal Block Patient position: sitting Prep: ChloraPrep Patient monitoring: heart rate, cardiac monitor, continuous pulse ox and blood pressure Approach: right paramedian Location: L2-3 Injection technique: single-shot Needle Needle type: Whitacre  Needle gauge: 22 G Needle length: 9 cm Assessment Sensory level: T4 Events: CSF return and second provider Additional Notes Attempts by SRNA and CRNA initially, then placed L2-3 with Whitacre 22 G via right paramedian by Cher Egnor.  Patient has rightward lumbar scoliosis.  Patient tolerated procedure well.

## 2022-07-02 NOTE — Evaluation (Signed)
Physical Therapy Evaluation Patient Details Name: Charlotte Burns MRN: [id number] DOB: 09/17/1945 Today's Date: 07/02/2022  History of Present Illness  Pt is a 77 yo F diagnosed with right knee osteoarthritis and is s/p elective R TKA.  PMH includes DM, R THA, L TKA, R TSA, and HTN.   Clinical Impression  Pt was pleasant and motivated to participate during the session and put forth good effort throughout. Pt required no physical assistance during the session but did require cuing for proper sequencing with transfers and gait along with min extra time and effort to perform functional tasks.  Pt was able to stand and take several steps at the EOB and to the chair with no adverse symptoms and with good control and stability, HR and SpO2 WNL on room air.  Pt will benefit from HHPT upon discharge to safely address deficits listed in patient problem list for decreased caregiver assistance and eventual return to PLOF.         Recommendations for follow up therapy are one component of a multi-disciplinary discharge planning process, led by the attending physician.  Recommendations may be updated based on patient status, additional functional criteria and insurance authorization.  Follow Up Recommendations Home health PT      Assistance Recommended at Discharge Frequent or constant Supervision/Assistance  Patient can return home with the following  A little help with walking and/or transfers;A little help with bathing/dressing/bathroom;Assistance with cooking/housework;Help with stairs or ramp for entrance;Assist for transportation    Equipment Recommendations None recommended by PT  Recommendations for Other Services       Functional Status Assessment Patient has had a recent decline in their functional status and demonstrates the ability to make significant improvements in function in a reasonable and predictable amount of time.     Precautions / Restrictions Precautions Precautions:  Fall Restrictions Weight Bearing Restrictions: Yes RLE Weight Bearing: Weight bearing as tolerated      Mobility  Bed Mobility Overal bed mobility: Modified Independent             General bed mobility comments: Min extra time and effort only    Transfers Overall transfer level: Needs assistance Equipment used: Rolling walker (2 wheels) Transfers: Sit to/from Stand Sit to Stand: Min guard           General transfer comment: Min verbal cues for hand and R foot positioning with good control and stability    Ambulation/Gait Ambulation/Gait assistance: Min guard Gait Distance (Feet): 5 Feet Assistive device: Rolling walker (2 wheels) Gait Pattern/deviations: Step-to pattern, Decreased stance time - right, Decreased step length - left, Antalgic Gait velocity: decreased     General Gait Details: Mod verbal and visual cues for step-to sequencing with good control and stability throughout  Stairs            Wheelchair Mobility    Modified Rankin (Stroke Patients Only)       Balance Overall balance assessment: Needs assistance   Sitting balance-Leahy Scale: Normal     Standing balance support: Bilateral upper extremity supported, During functional activity, Reliant on assistive device for balance Standing balance-Leahy Scale: Good                               Pertinent Vitals/Pain Pain Assessment Pain Assessment: 0-10 Pain Score: 5  Pain Location: R knee Pain Descriptors / Indicators: Aching, Sore Pain Intervention(s): Repositioned, Premedicated before session, Ice applied, Monitored during session  Home Living Family/patient expects to be discharged to:: Private residence Living Arrangements: Alone Available Help at Discharge: Family;Available 24 hours/day Type of Home: House Home Access: Stairs to enter Entrance Stairs-Rails: Right;Left (can not reach both) Entrance Stairs-Number of Steps: 3   Home Layout: Two level;Able to  live on main level with bedroom/bathroom Home Equipment: Rolling Walker (2 wheels);Rollator (4 wheels);BSC/3in1;Grab bars - tub/shower;Shower seat;Shower seat - built in Additional Comments: Owns an upright walker    Prior Function Prior Level of Function : Independent/Modified Independent             Mobility Comments: Ind amb without an AD community distances, uses a shopping cart for support for walking in grocery/dept stores, no fall history ADLs Comments: Ind with ADLs     Hand Dominance   Dominant Hand: Right    Extremity/Trunk Assessment   Upper Extremity Assessment Upper Extremity Assessment: Overall WFL for tasks assessed    Lower Extremity Assessment Lower Extremity Assessment: RLE deficits/detail RLE Deficits / Details: BLE ankle strength, AROM, and sensation WNL; able to perform ind RLE SLR without extensor lag RLE: Unable to fully assess due to pain RLE Sensation: WNL RLE Coordination: WNL       Communication   Communication: No difficulties  Cognition Arousal/Alertness: Awake/alert Behavior During Therapy: WFL for tasks assessed/performed Overall Cognitive Status: Within Functional Limits for tasks assessed                                          General Comments      Exercises Total Joint Exercises Ankle Circles/Pumps: AROM, Strengthening, Both, 10 reps Quad Sets: AROM, Strengthening, Right, 5 reps, 10 reps Long Arc Quad: AROM, Strengthening, Right, 5 reps, 10 reps Knee Flexion: AROM, Strengthening, Right, 5 reps, 10 reps Goniometric ROM: R knee AROM: 0-92 deg Marching in Standing: AROM, Strengthening, Both, 5 reps, Standing Other Exercises Other Exercises: HEP education per handout Other Exercises: R knee positioning education with pt and family to promote knee ext PROM and prevent heel pressure   Assessment/Plan    PT Assessment Patient needs continued PT services  PT Problem List Decreased strength;Decreased range of  motion;Decreased activity tolerance;Decreased balance;Decreased mobility;Decreased coordination;Decreased knowledge of use of DME;Pain       PT Treatment Interventions DME instruction;Gait training;Stair training;Functional mobility training;Therapeutic activities;Therapeutic exercise;Balance training;Patient/family education    PT Goals (Current goals can be found in the Care Plan section)  Acute Rehab PT Goals Patient Stated Goal: To walk without pain PT Goal Formulation: With patient Time For Goal Achievement: 07/15/22 Potential to Achieve Goals: Good    Frequency BID     Co-evaluation               AM-PAC PT "6 Clicks" Mobility  Outcome Measure Help needed turning from your back to your side while in a flat bed without using bedrails?: A Little Help needed moving from lying on your back to sitting on the side of a flat bed without using bedrails?: A Little Help needed moving to and from a bed to a chair (including a wheelchair)?: A Little Help needed standing up from a chair using your arms (e.g., wheelchair or bedside chair)?: A Little Help needed to walk in hospital room?: A Little Help needed climbing 3-5 steps with a railing? : A Lot 6 Click Score: 17    End of Session Equipment Utilized During Treatment: Gait  belt Activity Tolerance: Patient tolerated treatment well Patient left: in chair;with call bell/phone within reach;with chair alarm set;with SCD's reapplied;Other (comment) (polar care to R knee) Nurse Communication: Mobility status PT Visit Diagnosis: Muscle weakness (generalized) (M62.81);Other abnormalities of gait and mobility (R26.89);Pain Pain - Right/Left: Right Pain - part of body: Knee    Time: 9628-3662 PT Time Calculation (min) (ACUTE ONLY): 35 min   Charges:   PT Evaluation $PT Eval Moderate Complexity: 1 Mod PT Treatments $Therapeutic Exercise: 8-22 mins $Therapeutic Activity: 8-22 mins       D. Royetta Asal PT, DPT 07/02/22, 5:33  PM

## 2022-07-02 NOTE — Anesthesia Preprocedure Evaluation (Addendum)
Anesthesia Evaluation  Patient identified by MRN, date of birth, ID band Patient awake    Reviewed: Allergy & Precautions, NPO status , Patient's Chart, lab work & pertinent test results  History of Anesthesia Complications Negative for: history of anesthetic complications  Airway Mallampati: III   Neck ROM: Full    Dental  (+) Missing   Pulmonary former smoker (quit 1974)   Pulmonary exam normal breath sounds clear to auscultation       Cardiovascular hypertension, Normal cardiovascular exam Rhythm:Regular Rate:Normal  ECG 06/21/22: normal   Neuro/Psych TIA Neuromuscular disease (neuropathy)    GI/Hepatic ,GERD  ,,  Endo/Other  diabetes, Type 2  Obesity   Renal/GU negative Renal ROS     Musculoskeletal  (+) Arthritis ,    Abdominal   Peds  Hematology negative hematology ROS (+)   Anesthesia Other Findings Last dose of Mounjaro 06/19/22  Reproductive/Obstetrics                              Anesthesia Physical Anesthesia Plan  ASA: 3  Anesthesia Plan: Spinal   Post-op Pain Management:    Induction: Intravenous  PONV Risk Score and Plan: 3 and Propofol infusion, TIVA, Treatment may vary due to age or medical condition and Ondansetron  Airway Management Planned: Natural Airway and Nasal Cannula  Additional Equipment:   Intra-op Plan:   Post-operative Plan:   Informed Consent: I have reviewed the patients History and Physical, chart, labs and discussed the procedure including the risks, benefits and alternatives for the proposed anesthesia with the patient or authorized representative who has indicated his/her understanding and acceptance.       Plan Discussed with: CRNA  Anesthesia Plan Comments: (Plan for spinal and GA with natural airway, LMA/GETA backup.  Patient consented for risks of anesthesia including but not limited to:  - adverse reactions to medications -  damage to eyes, teeth, lips or other oral mucosa - nerve damage due to positioning  - sore throat or hoarseness - headache, bleeding, infection, nerve damage 2/2 spinal - damage to heart, brain, nerves, lungs, other parts of body or loss of life  Informed patient about role of CRNA in peri- and intra-operative care.  Patient voiced understanding.)         Anesthesia Quick Evaluation

## 2022-07-02 NOTE — Op Note (Signed)
Patient Name: Charlotte Burns  RFF:[id number]  Pre-Operative Diagnosis:Right knee Osteoarthritis  Post-Operative Diagnosis: (same)  Procedure: Right Total Knee Arthroplasty  Components/Implants: Femur: Persona Size 7 CR   Tibia: Size F w/ 14x30 stem extension  Poly: 46m MC  Patella: 32x8.577msymmetric cemented  Femoral Valgus Cut Angle: 5 degrees  Distal Femoral Re-cut: none  Patella Resurfacing: yes   Date of Surgery: 07/02/2022  Surgeon: ZaSteffanie RainwaterD  Assistant: ThDorise HissA (present and scrubbed throughout the case, critical for assistance with exposure, retraction, instrumentation, and closure), NaLanelle BalAS   Anesthesiologist: Mazzoni  Anesthesia: Spinal  Tourniquet Time: 67 min  EBL: 25cc  IVF: [id number]: None   Brief history: The patient is a [age]ear old female with a history of osteoarthritis of the right knee with pain limiting their range of motion and activities of daily living, which has failed multiple attempts at conservative therapy.  The risks and benefits of total knee arthroplasty as definitive surgical treatment were discussed with the patient, who opted to proceed with the operation.  After outpatient medical clearance and optimization was completed the [facility]  All preoperative films were reviewed and an appropriate surgical plan was made prior to surgery. Preoperative range of motion was 5 to 105 with a 5 flexion contracture. The patient was identified as having a valgus alignment.   Description of procedure: The patient was brought to the operating room where laterality was confirmed by all those present to be the right side.   Spinal anesthesia was administered and the patient received an intravenous dose of antibiotics for surgical prophylaxis and a dose of tranexamic acid.  Patient is positioned supine on the operating room table with all bony prominences  well-padded.  A well-padded tourniquet was applied to the right thigh.  The knee was then prepped and draped in usual sterile fashion with multiple layers of adhesive and nonadhesive drapes.  All of those present in the operating room participated in a surgical timeout laterality and patient were confirmed.   An Esmarch was wrapped around the extremity and the leg was elevated and the knee flexed.  The tourniquet was inflated to a pressure of 275 mmHg. The Esmarch was removed and the leg was brought down to full extension.  The patella and tibial tubercle identified and outlined using a marking pen and a midline skin incision was made with a knife carried through the subcutaneous tissue down to the extensor retinaculum.  After exposure of the extensor mechanism the medial parapatellar arthrotomy was performed with a scalpel and electrocautery extending down medial and distal to the tibial tubercle taking care to avoid incising the patellar tendon.   A standard medial release was performed over the proximal tibia.  The knee was brought into extension in order to excise the fat pad taking care not to damage the patella tendon.  The superior soft tissue was removed from the anterior surface of the distal femur to visualize for the procedure.  The knee was then brought into flexion with the patella subluxed laterally and subluxing the tibia anteriorly.  The ACL was transected and removed with electrocautery and additional soft tissue was removed from the proximal surface of the tibia to fully expose. The PCL was found to be intact and was preserved.  An extramedullary tibial cutting guide was then applied to the leg with a spring-loaded ankle clamp placed around the distal tibia just above the malleoli the angulation of  the guide was adjusted to give some posterior slope in the tibial resection with an appropriate varus/valgus alignment.  The resection guide was then pinned to the proximal tibia and the proximal  tibial surface was resected with an oscillating saw.  Careful attention was paid to ensure the blade did not disrupt any of the soft tissues including any lateral or medial ligament.  Attention was then turned to the femur, with the knee slightly flexed a opening drill was used to enter the medullary canal of the femur.  After removing the drill marrow was suctioned out to decompress the distal femur.  An intramedullary femoral guide was then inserted into the drill hole and the alignment guide was seated firmly against the distal end of the medial femoral condyle.  The distal femoral cutting guide was then attached and pinned securely to the anterior surface of the femur and the intramedullary rod and alignment guide was removed.  Distal femur resection was then performed with an oscillating saw with retractors protecting medial and laterally.   The distal cutting block was then removed and the extension gap was checked with a spacer.  Extension gap was found to be appropriately sized to accommodate the spacer block.   The femoral sizing guide was then placed securely into the posterior condyles of the femur and the femoral size was measured and determined to be 7.  The size 7; 4-in-1 cutting guide was placed in position and secured with 2 pins.  The anterior posterior and chamfer resections were then performed with an oscillating saw.  Bony fragments and osteophytes were then removed.  Using a lamina spreader the posterior medial and lateral condyles were checked for additional osteophytes and posterior soft tissue remnants.  Any remaining meniscus was removed at this time.  Periarticular injection was performed in the meniscal rims and posterior capsule with aspiration performed to ensure no intravascular injection.   The tibia was then exposed and the tibial trial was pinned onto the plateau after confirming appropriate orientation and rotation.  Using the drill bushing the tibia was prepared to the  appropriate drill depth.  Tibial broach impactor was then driven through the punch guide using a mallet.  The femoral trial component was then inserted onto the femur. A trial tibial polyethylene bearing was then placed and the knee was reduced.  The knee achieved full extension with no hyperextension and was found to be balanced in flexion and extension with the trials in place.  The knee was then brought into full extension the patella was everted and held with 2 Kocher clamps.  The articular surface of the patella was then resected with an patella reamer and saw after careful measurement with a caliper.  The patella was then prepared with the drill guide and a trial patella was placed.  The knee was then taken through range of motion and it was found that the patella circulated appropriately with the trochlea and good patellofemoral motion without subluxation.    The correct final components for implantation were confirmed and opened by the circulator nurse.  The prepared surfaces of the patella femur and tibia were cleaned with pulsatile lavage to remove all blood fat and other material and then the surfaces were dried.  2 bags of cement were mixed under vacuum and the components were cemented into place.  Excess cement was removed with curettes and forceps.  The final polyethylene tibial component was implanted and the knee was brought into full extension to allow the cement  to set.  At this time the periarticular injection cocktail was placed in the soft tissues surrounding the knee.  The knee was then irrigated with copious amount of normal saline via pulsatile lavage to remove all loose bodies and other debris.  The knee was then irrigated with surgiphor betadine based wash and reirrigated with saline.  The tourniquet was then dropped and all bleeding vessels were identified and coagulated.  The arthrotomy was approximated with #1 Vicryl and closed with #2 Quill suture.  The knee was brought into slight  flexion and the subcutaneous tissues were closed with 0 Vicryl, 2-0 Vicryl and a running subcuticular 3-0 monoderm quil suture.  Skin was then glued with Dermabond.  A sterile adhesive dressing was then placed along with a sequential compression device to the calf, a Ted stocking, and a cryotherapy cuff.   Sponge, needle, and Lap counts were all correct at the end of the case.   The patient was transferred off of the operating room table to a hospital bed, good pulses were found distally on the operative side.  The patient was transferred to the recovery room in stable condition.

## 2022-07-02 NOTE — Plan of Care (Signed)
  Problem: Skin Integrity: Goal: Risk for impaired skin integrity will decrease Outcome: Progressing   Problem: Activity: Goal: Ability to avoid complications of mobility impairment will improve Outcome: Progressing Goal: Range of joint motion will improve Outcome: Progressing   Problem: Skin Integrity: Goal: Will show signs of wound healing Outcome: Progressing   Problem: Safety: Goal: Ability to remain free from injury will improve Outcome: Progressing

## 2022-07-02 NOTE — Transfer of Care (Signed)
Immediate Anesthesia Transfer of Care Note  Patient: Charlotte Burns  Procedure(s) Performed: TOTAL KNEE ARTHROPLASTY (Right: Knee)  Patient Location: PACU  Anesthesia Type:MAC and Spinal  Level of Consciousness: awake and patient cooperative  Airway & Oxygen Therapy: Patient Spontanous Breathing and Patient connected to face mask oxygen  Post-op Assessment: Report given to RN and Post -op Vital signs reviewed and stable  Post vital signs: Reviewed and stable  Last Vitals:  Vitals Value Taken Time  BP 98/59 07/02/22 1330  Temp    Pulse 66 07/02/22 1331  Resp 12 07/02/22 1331  SpO2 95 % 07/02/22 1331  Vitals shown include unvalidated device data.  Last Pain:  Vitals:   07/02/22 0920  TempSrc: Tympanic      Patients Stated Pain Goal: 4 (23/30/07 6226)  Complications: No notable events documented.

## 2022-07-03 ENCOUNTER — Encounter: Payer: Self-pay | Admitting: Orthopedic Surgery

## 2022-07-03 DIAGNOSIS — Z96643 Presence of artificial hip joint, bilateral: Secondary | ICD-10-CM | POA: Diagnosis not present

## 2022-07-03 DIAGNOSIS — E119 Type 2 diabetes mellitus without complications: Secondary | ICD-10-CM | POA: Diagnosis not present

## 2022-07-03 DIAGNOSIS — Z96652 Presence of left artificial knee joint: Secondary | ICD-10-CM | POA: Diagnosis not present

## 2022-07-03 DIAGNOSIS — M1711 Unilateral primary osteoarthritis, right knee: Secondary | ICD-10-CM | POA: Diagnosis not present

## 2022-07-03 DIAGNOSIS — I10 Essential (primary) hypertension: Secondary | ICD-10-CM | POA: Diagnosis not present

## 2022-07-03 DIAGNOSIS — Z8673 Personal history of transient ischemic attack (TIA), and cerebral infarction without residual deficits: Secondary | ICD-10-CM | POA: Diagnosis not present

## 2022-07-03 DIAGNOSIS — Z79899 Other long term (current) drug therapy: Secondary | ICD-10-CM | POA: Diagnosis not present

## 2022-07-03 DIAGNOSIS — Z7982 Long term (current) use of aspirin: Secondary | ICD-10-CM | POA: Diagnosis not present

## 2022-07-03 DIAGNOSIS — Z96611 Presence of right artificial shoulder joint: Secondary | ICD-10-CM | POA: Diagnosis not present

## 2022-07-03 LAB — CBC
HCT: 32.4 % — ABNORMAL LOW (ref 36.0–46.0)
Hemoglobin: 10.6 g/dL — ABNORMAL LOW (ref 12.0–15.0)
MCH: 31.1 pg (ref 26.0–34.0)
MCHC: 32.7 g/dL (ref 30.0–36.0)
MCV: 95 fL (ref 80.0–100.0)
Platelets: 183 10*3/uL (ref 150–400)
RBC: 3.41 MIL/uL — ABNORMAL LOW (ref 3.87–5.11)
RDW: 11.7 % (ref 11.5–15.5)
WBC: 9.4 10*3/uL (ref 4.0–10.5)
nRBC: 0 % (ref 0.0–0.2)

## 2022-07-03 LAB — BASIC METABOLIC PANEL
Anion gap: 4 — ABNORMAL LOW (ref 5–15)
BUN: 24 mg/dL — ABNORMAL HIGH (ref 8–23)
CO2: 23 mmol/L (ref 22–32)
Calcium: 10.5 mg/dL — ABNORMAL HIGH (ref 8.9–10.3)
Chloride: 104 mmol/L (ref 98–111)
Creatinine, Ser: 1.01 mg/dL — ABNORMAL HIGH (ref 0.44–1.00)
GFR, Estimated: 57 mL/min — ABNORMAL LOW (ref >60)
Glucose, Bld: 244 mg/dL — ABNORMAL HIGH (ref 70–99)
Potassium: 4.3 mmol/L (ref 3.5–5.1)
Sodium: 131 mmol/L — ABNORMAL LOW (ref 135–145)

## 2022-07-03 LAB — GLUCOSE, CAPILLARY
Glucose-Capillary: 221 mg/dL — ABNORMAL HIGH (ref 70–99)
Glucose-Capillary: 206 mg/dL — ABNORMAL HIGH (ref 70–99)

## 2022-07-03 MED ORDER — ENOXAPARIN SODIUM 40 MG/0.4ML IJ SOSY: 40.0000 mg | PREFILLED_SYRINGE | INTRAMUSCULAR | 0 refills | Status: DC

## 2022-07-03 MED ORDER — ONDANSETRON HCL 4 MG PO TABS: 4.0000 mg | ORAL_TABLET | Freq: Four times a day (QID) | ORAL | 0 refills | Status: DC | PRN

## 2022-07-03 MED ORDER — CELECOXIB 100 MG PO CAPS: 100.0000 mg | ORAL_CAPSULE | Freq: Two times a day (BID) | ORAL | 0 refills | Status: AC

## 2022-07-03 MED ORDER — TRAMADOL HCL 50 MG PO TABS: 50.0000 mg | ORAL_TABLET | Freq: Four times a day (QID) | ORAL | 0 refills | Status: DC | PRN

## 2022-07-03 MED ORDER — DOCUSATE SODIUM 100 MG PO CAPS: 100.0000 mg | ORAL_CAPSULE | Freq: Two times a day (BID) | ORAL | 0 refills | Status: AC

## 2022-07-03 MED ORDER — ACETAMINOPHEN 500 MG PO TABS: 1000.0000 mg | ORAL_TABLET | Freq: Three times a day (TID) | ORAL | 0 refills | Status: AC

## 2022-07-03 NOTE — Progress Notes (Signed)
Physical Therapy Treatment Patient Details Name: Charlotte Burns MRN: [id number] DOB: 1945/09/19 Today's Date: 07/03/2022   History of Present Illness Pt is a 77 yo F diagnosed with right knee osteoarthritis and is s/p elective R TKA.  PMH includes DM, R THA, L TKA, R TSA, and HTN.    PT Comments    Pt was pleasant and motivated to participate during the session and put forth good effort throughout. Pt required no physical assistance during the session with cues given for proper sequencing with functional tasks, family present for all training.  Pt generally steady during gait but did present with 2-3 instances of mild R knee hyperextension that the pt stated was chronic, MD notified.  Pt steady during stair training with good carryover of proper sequencing.  Pt reported no adverse symptoms during the session with SpO2 and HR WNL on room air.  Pt will benefit from HHPT upon discharge to safely address deficits listed in patient problem list for decreased caregiver assistance and eventual return to PLOF.       Recommendations for follow up therapy are one component of a multi-disciplinary discharge planning process, led by the attending physician.  Recommendations may be updated based on patient status, additional functional criteria and insurance authorization.  Follow Up Recommendations  Home health PT     Assistance Recommended at Discharge Frequent or constant Supervision/Assistance  Patient can return home with the following A little help with walking and/or transfers;A little help with bathing/dressing/bathroom;Assistance with cooking/housework;Help with stairs or ramp for entrance;Assist for transportation   Equipment Recommendations  None recommended by PT    Recommendations for Other Services       Precautions / Restrictions Precautions Precautions: Fall;Knee Restrictions Weight Bearing Restrictions: Yes RLE Weight Bearing: Weight bearing as tolerated     Mobility  Bed  Mobility               General bed mobility comments: NT, pt in recliner    Transfers Overall transfer level: Needs assistance Equipment used: Rolling walker (2 wheels) Transfers: Sit to/from Stand Sit to Stand: Supervision           General transfer comment: Min verbal cues for hand placement    Ambulation/Gait Ambulation/Gait assistance: Min guard Gait Distance (Feet): 125 Feet x 2 Assistive device: Rolling walker (2 wheels) Gait Pattern/deviations: Step-to pattern, Decreased stance time - right, Decreased step length - left, Antalgic Gait velocity: decreased     General Gait Details: Min verbal cues for step-to sequencing with occasional mild hyperextension of the R knee causing min buckling that the pt was able to self-correct without assist, MD notified   Stairs Stairs: Yes Stairs assistance: Min guard Stair Management: One rail Right, Sideways, Step to pattern Number of Stairs: 4 General stair comments: Pt and son education/practice with stair training with mod multi-modal cues for proper sequencing; pt steady ascending and descending steps with no LOB and good carryover of proper sequencing   Wheelchair Mobility    Modified Rankin (Stroke Patients Only)       Balance Overall balance assessment: Needs assistance Sitting-balance support: No upper extremity supported, Feet supported Sitting balance-Leahy Scale: Normal     Standing balance support: During functional activity, Bilateral upper extremity supported Standing balance-Leahy Scale: Good                              Cognition Arousal/Alertness: Awake/alert Behavior During Therapy: WFL for tasks assessed/performed  Overall Cognitive Status: Within Functional Limits for tasks assessed                                          Exercises Total Joint Exercises Quad Sets: AROM, Strengthening, Right, 10 reps, 15 reps Long Arc Quad: AROM, Strengthening, Right, 10  reps, 15 reps Knee Flexion: AROM, Strengthening, Right, 10 reps, 15 reps Goniometric ROM: R knee AROM: 0-98 deg Marching in Standing: AROM, Strengthening, Both, 5 reps, Standing Other Exercises Other Exercises: Car transfer sequencing education provided to pt and family Other Exercises: R knee positioning education/review with pt and family to promote knee ext PROM and prevent heel pressure    General Comments        Pertinent Vitals/Pain Pain Assessment Pain Assessment: 0-10 Pain Score: 3  Pain Location: R knee Pain Descriptors / Indicators: Aching, Sore Pain Intervention(s): Repositioned, Ice applied, Monitored during session, Premedicated before session    Home Living Family/patient expects to be discharged to:: Private residence Living Arrangements: Children Available Help at Discharge: Family;Available 24 hours/day Type of Home: House Home Access: Stairs to enter Entrance Stairs-Rails: Psychiatric nurse of Steps: 3   Home Layout: Two level;Able to live on main level with bedroom/bathroom Home Equipment: Rolling Walker (2 wheels);Rollator (4 wheels);BSC/3in1;Grab bars - tub/shower;Shower seat;Shower seat - built in Additional Comments: Owns an upright walker    Prior Function            PT Goals (current goals can now be found in the care plan section) Progress towards PT goals: Progressing toward goals    Frequency    BID      PT Plan Current plan remains appropriate    Co-evaluation              AM-PAC PT "6 Clicks" Mobility   Outcome Measure  Help needed turning from your back to your side while in a flat bed without using bedrails?: A Little Help needed moving from lying on your back to sitting on the side of a flat bed without using bedrails?: A Little Help needed moving to and from a bed to a chair (including a wheelchair)?: A Little Help needed standing up from a chair using your arms (e.g., wheelchair or bedside chair)?: A  Little Help needed to walk in hospital room?: A Little Help needed climbing 3-5 steps with a railing? : A Little 6 Click Score: 18    End of Session Equipment Utilized During Treatment: Gait belt Activity Tolerance: Patient tolerated treatment well Patient left: in chair;with call bell/phone within reach;with chair alarm set;with family/visitor present;Other (comment) (polar care donned to R knee) Nurse Communication: Mobility status PT Visit Diagnosis: Muscle weakness (generalized) (M62.81);Other abnormalities of gait and mobility (R26.89);Pain Pain - Right/Left: Right Pain - part of body: Knee     Time: 3846-6599 PT Time Calculation (min) (ACUTE ONLY): 39 min  Charges:  $Gait Training: 23-37 mins $Therapeutic Exercise: 8-22 mins                     D. Scott Giara Mcgaughey PT, DPT 07/03/22, 12:22 PM

## 2022-07-03 NOTE — Discharge Instructions (Signed)
 Instructions after Total Knee Replacement   Charlotte Burns M.D.     Dept. of Orthopaedics & Sports Medicine  [facility]  [address]  [place], [place]  [address]  Phone: [phone]   Fax: [phone]    DIET: Drink plenty of non-alcoholic fluids. Resume your normal diet. Include foods high in fiber.  ACTIVITY:  You may use crutches or a walker with weight-bearing as tolerated, unless instructed otherwise. You may be weaned off of the walker or crutches by your Physical Therapist.  Do NOT place pillows under the knee. Anything placed under the knee could limit your ability to straighten the knee.   Continue doing gentle exercises. Exercising will reduce the pain and swelling, increase motion, and prevent muscle weakness.   Please continue to use the TED compression stockings for 2 weeks. You may remove the stockings at night, but should reapply them in the morning. Do not drive or operate any equipment until instructed.  WOUND CARE:  Continue to use the PolarCare or ice packs periodically to reduce pain and swelling. You may begin showering 3 days after surgery with honeycomb dressing. Remove honeycomb dressing 7 days after surgery and continue showering. Allow dermabond to fall off on its own.  MEDICATIONS: You may resume your regular medications. Please take the pain medication as prescribed on the medication. Do not take pain medication on an empty stomach. You have been given a prescription for a blood thinner (Lovenox or Coumadin). Please take the medication as instructed. (NOTE: After completing a 2 week course of Lovenox, take one 81 mg Enteric-coated aspirin twice a day. This along with elevation will help reduce the possibility of phlebitis in your operated leg.) Do not drive or drink alcoholic beverages when taking pain medications.  POSTOPERATIVE CONSTIPATION PROTOCOL Constipation - defined medically as fewer than three stools per week and severe  constipation as less than one stool per week.  One of the most common issues patients have following surgery is constipation.  Even if you have a regular bowel pattern at home, your normal regimen is likely to be disrupted due to multiple reasons following surgery.  Combination of anesthesia, postoperative narcotics, change in appetite and fluid intake all can affect your bowels.  In order to avoid complications following surgery, here are some recommendations in order to help you during your recovery period.  Colace (docusate) - Pick up an over-the-counter form of Colace or another stool softener and take twice a day as long as you are requiring postoperative pain medications.  Take with a full glass of water daily.  If you experience loose stools or diarrhea, hold the colace until you stool forms back up.  If your symptoms do not get better within 1 week or if they get worse, check with your doctor.  Dulcolax (bisacodyl) - Pick up over-the-counter and take as directed by the product packaging as needed to assist with the movement of your bowels.  Take with a full glass of water.  Use this product as needed if not relieved by Colace only.   MiraLax (polyethylene glycol) - Pick up over-the-counter to have on hand.  MiraLax is a solution that will increase the amount of water in your bowels to assist with bowel movements.  Take as directed and can mix with a glass of water, juice, soda, coffee, or tea.  Take if you go more than two days without a movement. Do not use MiraLax more than once per day. Call your doctor if   you are still constipated or irregular after using this medication for 7 days in a row.  If you continue to have problems with postoperative constipation, please contact the office for further assistance and recommendations.  If you experience "the worst abdominal pain ever" or develop nausea or vomiting, please contact the office immediatly for further recommendations for  treatment.   CALL THE OFFICE FOR: Temperature above 101 degrees Excessive bleeding or drainage on the dressing. Excessive swelling, coldness, or paleness of the toes. Persistent nausea and vomiting.  FOLLOW-UP:  You should have an appointment to return to the office in 14 days after surgery. Arrangements have been made for continuation of Physical Therapy (either home therapy or outpatient therapy).  

## 2022-07-03 NOTE — Plan of Care (Signed)
  Problem: Activity: Goal: Ability to avoid complications of mobility impairment will improve Outcome: Progressing   Problem: Pain Management: Goal: Pain level will decrease with appropriate interventions Outcome: Progressing   Problem: Pain Managment: Goal: General experience of comfort will improve Outcome: Progressing   Problem: Safety: Goal: Ability to remain free from injury will improve Outcome: Progressing

## 2022-07-03 NOTE — Progress Notes (Signed)
   Subjective: 1 Day Post-Op Procedure(s) (LRB): TOTAL KNEE ARTHROPLASTY (Right) Patient reports pain as mild.   Patient is well, and has had no acute complaints or problems Denies any CP, SOB, ABD pain. We will continue therapy today.  Plan is to go Home after hospital stay.  Objective: Vital signs in last 24 hours: Temp:  [96.3 F (35.7 C)-98.5 F (36.9 C)] 98.3 F (36.8 C) (01/30 0726) Pulse Rate:  [64-99] 77 (01/30 0726) Resp:  [10-20] 18 (01/30 0726) BP: (98-135)/(55-79) 121/73 (01/30 0726) SpO2:  [87 %-99 %] 94 % (01/30 0726) Weight:  [89.4 kg] 89.4 kg (01/29 1803)  Intake/Output from previous day: 01/29 0701 - 01/30 0700 In: 1300 [I.V.:900; IV Piggyback:400] Out: 625 [Urine:600; Blood:25] Intake/Output this shift: No intake/output data recorded.  Recent Labs    07/03/22 0242  HGB 10.6*   Recent Labs    07/03/22 0242  WBC 9.4  RBC 3.41*  HCT 32.4*  PLT 183   Recent Labs    07/03/22 0242  NA 131*  K 4.3  CL 104  CO2 23  BUN 24*  CREATININE 1.01*  GLUCOSE 244*  CALCIUM 10.5*   No results for input(s): "LABPT", "INR" in the last 72 hours.  EXAM General - Patient is Alert, Appropriate, and Oriented Extremity - Neurovascular intact Sensation intact distally Intact pulses distally Dorsiflexion/Plantar flexion intact No cellulitis present Compartment soft Dressing - dressing C/D/I and no drainage Motor Function - intact, moving foot and toes well on exam.   Past Medical History:  Diagnosis Date   Arthritis    Chest tightness    ECHO AND STRESS    Diabetes mellitus without complication (HCC)    DJD (degenerative joint disease)    GERD (gastroesophageal reflux disease)    H/O hypercholesterolemia    told one time   History of shingles    left side of face jan 2015   Hypertension    Pneumonia    hx of age 72   Shortness of breath    occasional, usually with activity   TIA (transient ischemic attack)     Assessment/Plan:   1 Day  Post-Op Procedure(s) (LRB): TOTAL KNEE ARTHROPLASTY (Right) Principal Problem:   S/P TKR (total knee replacement) using cement, right  Estimated body mass index is 31.79 kg/m as calculated from the following:   Height as of this encounter: '5\' 6"'$  (1.676 m).   Weight as of this encounter: 89.4 kg. Advance diet Up with therapy Pain well controlled VSS Labs stable CM to assist with discharge to home with HHPT. Anticipate dc to home today.  DVT Prophylaxis - Lovenox, TED hose, and SCDs Weight-Bearing as tolerated to right leg   T. Rachelle Hora, PA-C [facility] 07/03/2022, 7:53 AM

## 2022-07-03 NOTE — Evaluation (Signed)
Occupational Therapy Evaluation Patient Details Name: Charlotte Burns MRN: [id number] DOB: 1945-12-22 Today's Date: 07/03/2022   History of Present Illness Pt is a 77 yo F diagnosed with right knee osteoarthritis and is s/p elective R TKA.  PMH includes DM, R THA, L TKA, R TSA, and HTN.   Clinical Impression   Ms Natt was seen for OT evaluation this date. Prior to hospital admission, pt was IND. Pt lives alone, son planning to assist. Pt presents to acute OT demonstrating impaired ADL performance and functional mobility 2/2 decreased activity tolerance. Pt currently requires SUPERVISION don pants/shirt, sup for standing balance no AD. SUP + RW toilet t/f. MOD A don B compression socks sitting. Pt instructed in polar care mgt, falls prevention strategies,  DME/AE for LB bathing/dressing tasks, and compression stocking mgt. Handout provided. No acute OT needs identified, will sign off. Upon hospital discharge, recommend no OT follow up.    Recommendations for follow up therapy are one component of a multi-disciplinary discharge planning process, led by the attending physician.  Recommendations may be updated based on patient status, additional functional criteria and insurance authorization.   Follow Up Recommendations  No OT follow up     Assistance Recommended at Discharge Intermittent Supervision/Assistance  Patient can return home with the following Help with stairs or ramp for entrance    Functional Status Assessment  Patient has had a recent decline in their functional status and demonstrates the ability to make significant improvements in function in a reasonable and predictable amount of time.  Equipment Recommendations  None recommended by OT    Recommendations for Other Services       Precautions / Restrictions Precautions Precautions: Fall;Knee Restrictions Weight Bearing Restrictions: Yes RLE Weight Bearing: Weight bearing as tolerated      Mobility Bed  Mobility Overal bed mobility: Modified Independent                  Transfers Overall transfer level: Needs assistance Equipment used: None Transfers: Sit to/from Stand Sit to Stand: Supervision                  Balance Overall balance assessment: Needs assistance Sitting-balance support: No upper extremity supported, Feet supported Sitting balance-Leahy Scale: Normal     Standing balance support: No upper extremity supported, During functional activity Standing balance-Leahy Scale: Good                             ADL either performed or assessed with clinical judgement   ADL Overall ADL's : Needs assistance/impaired                                       General ADL Comments: SUPERVISION don pants/shirt, sup for standing balance no AD. SUP + RW toilet t/f. MOD A don B compression socks sitting.       Pertinent Vitals/Pain Pain Assessment Pain Assessment: 0-10 Pain Score: 2  Pain Location: R knee Pain Descriptors / Indicators: Aching, Sore Pain Intervention(s): Limited activity within patient's tolerance, Repositioned     Hand Dominance Right   Extremity/Trunk Assessment Upper Extremity Assessment Upper Extremity Assessment: Overall WFL for tasks assessed   Lower Extremity Assessment Lower Extremity Assessment: Overall WFL for tasks assessed       Communication Communication Communication: No difficulties   Cognition Arousal/Alertness: Awake/alert Behavior During Therapy: [facility]  for tasks assessed/performed Overall Cognitive Status: Within Functional Limits for tasks assessed                                                  Home Living Family/patient expects to be discharged to:: Private residence Living Arrangements: Children Available Help at Discharge: Family;Available 24 hours/day Type of Home: House Home Access: Stairs to enter [employer] of Steps: 3 Entrance Stairs-Rails:  Right;Left Home Layout: Two level;Able to live on main level with bedroom/bathroom     Bathroom Shower/Tub: Hospital doctor Toilet: Handicapped height     Home Equipment: Conservation officer, nature (2 wheels);Rollator (4 wheels);BSC/3in1;Grab bars - tub/shower;Shower seat;Shower seat - built in   Additional Comments: Owns an upright walker      Prior Functioning/Environment Prior Level of Function : Independent/Modified Independent             Mobility Comments: Ind amb without an AD community distances, uses a shopping cart for support for walking in grocery/dept stores, no fall history ADLs Comments: Ind with ADLs        OT Problem List: Decreased strength;Decreased range of motion;Decreased activity tolerance;Impaired balance (sitting and/or standing);Decreased safety awareness         OT Goals(Current goals can be found in the care plan section) Acute Rehab OT Goals Patient Stated Goal: to go home OT Goal Formulation: With patient/family Time For Goal Achievement: 07/17/22 Potential to Achieve Goals: Good   AM-PAC OT "6 Clicks" Daily Activity     Outcome Measure Help from another person eating meals?: None Help from another person taking care of personal grooming?: None Help from another person toileting, which includes using toliet, bedpan, or urinal?: A Little Help from another person bathing (including washing, rinsing, drying)?: A Little Help from another person to put on and taking off regular upper body clothing?: None Help from another person to put on and taking off regular lower body clothing?: None 6 Click Score: 22   End of Session    Activity Tolerance: Patient tolerated treatment well Patient left: in chair;with call bell/phone within reach;with family/visitor present  OT Visit Diagnosis: Other abnormalities of gait and mobility (R26.89);Muscle weakness (generalized) (M62.81)                Time: 6754-4920 OT Time Calculation (min): 20  min Charges:  OT General Charges $OT Visit: 1 Visit OT Evaluation $OT Eval Low Complexity: 1 Low OT Treatments $Self Care/Home Management : 8-22 mins  Dessie Coma, M.S. OTR/L  07/03/22, 10:38 AM  ascom [phone]

## 2022-07-03 NOTE — Discharge Summary (Signed)
Physician Discharge Summary  Patient ID: Charlotte Burns MRN: [id number] DOB/AGE: May 29, 1946 77 y.o.  Admit date: 07/02/2022 Discharge date: 07/03/2022  Admission Diagnoses:  S/P TKR (total knee replacement) using cement, right [Z96.651]   Discharge Diagnoses: Patient Active Problem List   Diagnosis Date Noted   S/P TKR (total knee replacement) using cement, right 07/02/2022   S/p reverse total shoulder arthroplasty 02/15/2020   Intraductal papilloma of breast, left 06/19/2017   OA (osteoarthritis) of knee 01/25/2014   Popstop Acute blood loss anemia 02/26/2012   Postop Hyponatremia 02/26/2012   OA (osteoarthritis) of hip 02/25/2012    Past Medical History:  Diagnosis Date   Arthritis    Chest tightness    ECHO AND STRESS    Diabetes mellitus without complication (HCC)    DJD (degenerative joint disease)    GERD (gastroesophageal reflux disease)    H/O hypercholesterolemia    told one time   History of shingles    left side of face jan 2015   Hypertension    Pneumonia    hx of age 81   Shortness of breath    occasional, usually with activity   TIA (transient ischemic attack)      Transfusion: none   Consultants (if any):   Discharged Condition: Improved  Hospital Course: Charlotte Burns is an 77 y.o. female who was admitted 07/02/2022 with a diagnosis of S/P TKR (total knee replacement) using cement, right and went to the operating room on 07/02/2022 and underwent the above named procedures.    Surgeries: Procedure(s): TOTAL KNEE ARTHROPLASTY on 07/02/2022 Patient tolerated the surgery well. Taken to PACU where she was stabilized and then transferred to the orthopedic floor.  Started on Lovenox 30 mg q 12 hrs. TEDs and SCDs applied bilaterally. Heels elevated on bed. No evidence of DVT. Negative Homan. Physical therapy started on day #1 for gait training and transfer. OT started day #1 for ADL and assisted devices.Patient's IV  was d/c on day #1. Patient was  able to safely and independently complete all PT goals. PT recommending discharge to home. On post op day #1 patient was stable and ready for discharge to home with HHPT.  Implants: Femur: Persona Size 7 CR   Tibia: Size F w/ 14x30 stem extension  Poly: 20m MC  Patella: 32x8.526msymmetric cemented   She was given perioperative antibiotics:  Anti-infectives (From admission, onward)    Start     Dose/Rate Route Frequency Ordered Stop   07/02/22 1700  ceFAZolin (ANCEF) IVPB 2g/100 mL premix        2 g 200 mL/hr over 30 Minutes Intravenous Every 6 hours 07/02/22 1607 07/02/22 2255   07/02/22 0908  ceFAZolin (ANCEF) 2-4 GM/100ML-% IVPB       Note to Pharmacy: IbRebecca Eaton: cabinet override      07/02/22 0908 07/02/22 1128   07/02/22 0900  ceFAZolin (ANCEF) IVPB 2g/100 mL premix        2 g 200 mL/hr over 30 Minutes Intravenous On call to O.R. 07/02/22 0827061/29/24 1128     .  She was given sequential compression devices, early ambulation, and Lovenox TEDs for DVT prophylaxis.  She benefited maximally from the hospital stay and there were no complications.    Recent vital signs:  Vitals:   07/03/22 0446 07/03/22 0726  BP: (!) 111/58 121/73  Pulse: 76 77  Resp: 20 18  Temp: 98.4 F (36.9 C) 98.3 F (36.8 C)  SpO2: 96% 94%  Recent laboratory studies:  Lab Results  Component Value Date   HGB 10.6 (L) 07/03/2022   HGB 13.1 06/21/2022   HGB 13.2 02/25/2021   Lab Results  Component Value Date   WBC 9.4 07/03/2022   PLT 183 07/03/2022   Lab Results  Component Value Date   INR 0.98 01/19/2014   Lab Results  Component Value Date   NA 131 (L) 07/03/2022   K 4.3 07/03/2022   CL 104 07/03/2022   CO2 23 07/03/2022   BUN 24 (H) 07/03/2022   CREATININE 1.01 (H) 07/03/2022   GLUCOSE 244 (H) 07/03/2022    Discharge Medications:   Allergies as of 07/03/2022   No Known Allergies      Medication List     STOP taking these medications    acetaminophen 650 MG CR  tablet Commonly known as: TYLENOL Replaced by: acetaminophen 500 MG tablet       TAKE these medications    acetaminophen 500 MG tablet Commonly known as: TYLENOL Take 2 tablets (1,000 mg total) by mouth every 8 (eight) hours. Replaces: acetaminophen 650 MG CR tablet   celecoxib 100 MG capsule Commonly known as: CeleBREX Take 1 capsule (100 mg total) by mouth 2 (two) times daily for 10 days.   docusate sodium 100 MG capsule Commonly known as: COLACE Take 1 capsule (100 mg total) by mouth 2 (two) times daily.   DULoxetine 60 MG capsule Commonly known as: CYMBALTA Take 60 mg by mouth every evening.   enoxaparin 40 MG/0.4ML injection Commonly known as: LOVENOX Inject 0.4 mLs (40 mg total) into the skin daily for 14 days.   gabapentin 300 MG capsule Commonly known as: NEURONTIN Take 300 mg by mouth every evening.   glipiZIDE 5 MG 24 hr tablet Commonly known as: GLUCOTROL XL Take 5 mg by mouth daily with breakfast.   metFORMIN 500 MG 24 hr tablet Commonly known as: GLUCOPHAGE-XR Take 1,000 mg by mouth every evening.   Mounjaro 2.5 MG/0.5ML Pen Generic drug: tirzepatide Inject 2.5 mg into the skin once a week. On Tuesdays   olmesartan 20 MG tablet Commonly known as: BENICAR Take 20 mg by mouth every morning.   omeprazole 20 MG capsule Commonly known as: PRILOSEC Take 20 mg by mouth every morning.   ondansetron 4 MG tablet Commonly known as: ZOFRAN Take 1 tablet (4 mg total) by mouth every 6 (six) hours as needed for nausea.   rosuvastatin 5 MG tablet Commonly known as: CRESTOR Take 5 mg by mouth every evening.   traMADol 50 MG tablet Commonly known as: ULTRAM Take 1-2 tablets (50-100 mg total) by mouth every 6 (six) hours as needed for moderate pain.   Vitamin D3 50 MCG (2000 UT) Tabs Take 1 tablet by mouth daily.   Voltaren Arthritis Pain 1 % Gel Generic drug: diclofenac Sodium Apply 2 g topically 4 (four) times daily as needed.        Diagnostic  Studies: DG Knee Right Port  Result Date: 07/02/2022 CLINICAL DATA:  Status post right knee replacement. EXAM: PORTABLE RIGHT KNEE - 1-2 VIEW COMPARISON:  None Available. FINDINGS: Right knee replacement is identified without malalignment. Post surgical changes including joint fluid and air identified. IMPRESSION: Right knee replacement without malalignment. Electronically Signed   By: Abelardo Diesel M.D.   On: 07/02/2022 14:23    Disposition:      Follow-up Information     Duanne Guess, PA-C Follow up in 2 week(s).   Specialties: Orthopedic  Surgery, Emergency Medicine Contact information: [place] [address] [phone]                  Signed: Feliberto Gottron 07/03/2022, 8:01 AM

## 2022-07-03 NOTE — TOC Initial Note (Signed)
Transition of Care [facility]) - Initial/Assessment Note    Patient Details  Name: Charlotte Burns MRN: [id number] Date of Birth: [birth year]  Transition of Care [facility]) CM/SW Contact:    Tiburcio Bash, LCSW Phone Number: 07/03/2022, 10:04 AM  Clinical Narrative:                  Patient from home with spouse, presenting for total knee arthroplasty (right) .  CSW spoke with patient's spouse Aaron Edelman, he confirms patient is set up with [facility] for PT and OT at time of discharge. He reports no dme needs at this time.   Please consult TOC should needs arise.   Expected Discharge Plan: [place] Barriers to Discharge: Continued Medical Work up   Patient Goals and CMS Choice Patient states their goals for this hospitalization and ongoing recovery are:: to go home CMS Medicare.gov Compare Post Acute Care list provided to:: Patient Choice offered to / list presented to : Patient      Expected Discharge Plan and Services       Living arrangements for the past 2 months: [place]: PT, OT HH Agency: [place] Date [facility] Agency Contacted: 07/03/22 Time [place]: [phone] Representative spoke with at [place]: [place]  Prior Living Arrangements/Services Living arrangements for the past 2 months: [place] with:: Spouse                   Activities of Daily Living Home Assistive Devices/Equipment: Dentures (specify type), CBG Meter, Walker (specify type) ADL Screening (condition at time of admission) Patient's cognitive ability adequate to safely complete daily activities?: Yes Is the patient deaf or have difficulty hearing?: No Does the patient have difficulty seeing, even when wearing glasses/contacts?: No Does the patient have difficulty concentrating, remembering, or making decisions?: No Patient able to express need for assistance with ADLs?: Yes Does the patient  have difficulty dressing or bathing?: No Independently performs ADLs?: Yes (appropriate for developmental age) Does the patient have difficulty walking or climbing stairs?: Yes Weakness of Legs: Right Weakness of Arms/Hands: None  Permission Sought/Granted                  Emotional Assessment              Admission diagnosis:  S/P TKR (total knee replacement) using cement, right [Z96.651] Patient Active Problem List   Diagnosis Date Noted   S/P TKR (total knee replacement) using cement, right 07/02/2022   S/p reverse total shoulder arthroplasty 02/15/2020   Intraductal papilloma of breast, left 06/19/2017   OA (osteoarthritis) of knee 01/25/2014   Popstop Acute blood loss anemia 02/26/2012   Postop Hyponatremia 02/26/2012   OA (osteoarthritis) of hip 02/25/2012   PCP:  Maryland Pink, MD Pharmacy:   [facility] [place] [address], [place] - [place] [address] Phone: [phone] Fax: [phone]  [facility] [place], [place] - [place] [address] Phone: [phone] Fax: [phone]     Social Determinants of Health (SDOH) Social History: SDOH Screenings   Food Insecurity: No Food Insecurity (07/02/2022)  Housing: Low Risk  (07/02/2022)  Transportation Needs: No Transportation Needs (07/02/2022)  Utilities: Not At Risk (07/02/2022)  Tobacco Use: Medium  Risk (07/02/2022)   SDOH Interventions:     Readmission Risk Interventions     No data to display

## 2022-07-04 DIAGNOSIS — E7800 Pure hypercholesterolemia, unspecified: Secondary | ICD-10-CM | POA: Diagnosis not present

## 2022-07-04 DIAGNOSIS — K219 Gastro-esophageal reflux disease without esophagitis: Secondary | ICD-10-CM | POA: Diagnosis not present

## 2022-07-04 DIAGNOSIS — E119 Type 2 diabetes mellitus without complications: Secondary | ICD-10-CM | POA: Diagnosis not present

## 2022-07-04 DIAGNOSIS — E213 Hyperparathyroidism, unspecified: Secondary | ICD-10-CM | POA: Diagnosis not present

## 2022-07-04 DIAGNOSIS — Z7984 Long term (current) use of oral hypoglycemic drugs: Secondary | ICD-10-CM | POA: Diagnosis not present

## 2022-07-04 DIAGNOSIS — Z471 Aftercare following joint replacement surgery: Secondary | ICD-10-CM | POA: Diagnosis not present

## 2022-07-04 DIAGNOSIS — Z96651 Presence of right artificial knee joint: Secondary | ICD-10-CM | POA: Diagnosis not present

## 2022-07-04 DIAGNOSIS — Z7901 Long term (current) use of anticoagulants: Secondary | ICD-10-CM | POA: Diagnosis not present

## 2022-07-04 DIAGNOSIS — I10 Essential (primary) hypertension: Secondary | ICD-10-CM | POA: Diagnosis not present

## 2022-07-06 DIAGNOSIS — Z7901 Long term (current) use of anticoagulants: Secondary | ICD-10-CM | POA: Diagnosis not present

## 2022-07-06 DIAGNOSIS — K219 Gastro-esophageal reflux disease without esophagitis: Secondary | ICD-10-CM | POA: Diagnosis not present

## 2022-07-06 DIAGNOSIS — E119 Type 2 diabetes mellitus without complications: Secondary | ICD-10-CM | POA: Diagnosis not present

## 2022-07-06 DIAGNOSIS — Z96651 Presence of right artificial knee joint: Secondary | ICD-10-CM | POA: Diagnosis not present

## 2022-07-06 DIAGNOSIS — Z471 Aftercare following joint replacement surgery: Secondary | ICD-10-CM | POA: Diagnosis not present

## 2022-07-06 DIAGNOSIS — I10 Essential (primary) hypertension: Secondary | ICD-10-CM | POA: Diagnosis not present

## 2022-07-06 DIAGNOSIS — Z7984 Long term (current) use of oral hypoglycemic drugs: Secondary | ICD-10-CM | POA: Diagnosis not present

## 2022-07-06 DIAGNOSIS — E213 Hyperparathyroidism, unspecified: Secondary | ICD-10-CM | POA: Diagnosis not present

## 2022-07-06 DIAGNOSIS — E7800 Pure hypercholesterolemia, unspecified: Secondary | ICD-10-CM | POA: Diagnosis not present

## 2022-07-09 DIAGNOSIS — K219 Gastro-esophageal reflux disease without esophagitis: Secondary | ICD-10-CM | POA: Diagnosis not present

## 2022-07-09 DIAGNOSIS — I10 Essential (primary) hypertension: Secondary | ICD-10-CM | POA: Diagnosis not present

## 2022-07-09 DIAGNOSIS — Z7901 Long term (current) use of anticoagulants: Secondary | ICD-10-CM | POA: Diagnosis not present

## 2022-07-09 DIAGNOSIS — E119 Type 2 diabetes mellitus without complications: Secondary | ICD-10-CM | POA: Diagnosis not present

## 2022-07-09 DIAGNOSIS — Z96651 Presence of right artificial knee joint: Secondary | ICD-10-CM | POA: Diagnosis not present

## 2022-07-09 DIAGNOSIS — Z471 Aftercare following joint replacement surgery: Secondary | ICD-10-CM | POA: Diagnosis not present

## 2022-07-09 DIAGNOSIS — Z7984 Long term (current) use of oral hypoglycemic drugs: Secondary | ICD-10-CM | POA: Diagnosis not present

## 2022-07-09 DIAGNOSIS — E7800 Pure hypercholesterolemia, unspecified: Secondary | ICD-10-CM | POA: Diagnosis not present

## 2022-07-09 DIAGNOSIS — E213 Hyperparathyroidism, unspecified: Secondary | ICD-10-CM | POA: Diagnosis not present

## 2022-07-11 DIAGNOSIS — Z96651 Presence of right artificial knee joint: Secondary | ICD-10-CM | POA: Diagnosis not present

## 2022-07-11 DIAGNOSIS — Z7984 Long term (current) use of oral hypoglycemic drugs: Secondary | ICD-10-CM | POA: Diagnosis not present

## 2022-07-11 DIAGNOSIS — E213 Hyperparathyroidism, unspecified: Secondary | ICD-10-CM | POA: Diagnosis not present

## 2022-07-11 DIAGNOSIS — E7800 Pure hypercholesterolemia, unspecified: Secondary | ICD-10-CM | POA: Diagnosis not present

## 2022-07-11 DIAGNOSIS — K219 Gastro-esophageal reflux disease without esophagitis: Secondary | ICD-10-CM | POA: Diagnosis not present

## 2022-07-11 DIAGNOSIS — I10 Essential (primary) hypertension: Secondary | ICD-10-CM | POA: Diagnosis not present

## 2022-07-11 DIAGNOSIS — Z471 Aftercare following joint replacement surgery: Secondary | ICD-10-CM | POA: Diagnosis not present

## 2022-07-11 DIAGNOSIS — E119 Type 2 diabetes mellitus without complications: Secondary | ICD-10-CM | POA: Diagnosis not present

## 2022-07-11 DIAGNOSIS — Z7901 Long term (current) use of anticoagulants: Secondary | ICD-10-CM | POA: Diagnosis not present

## 2022-07-13 DIAGNOSIS — Z7901 Long term (current) use of anticoagulants: Secondary | ICD-10-CM | POA: Diagnosis not present

## 2022-07-13 DIAGNOSIS — I10 Essential (primary) hypertension: Secondary | ICD-10-CM | POA: Diagnosis not present

## 2022-07-13 DIAGNOSIS — E213 Hyperparathyroidism, unspecified: Secondary | ICD-10-CM | POA: Diagnosis not present

## 2022-07-13 DIAGNOSIS — Z7984 Long term (current) use of oral hypoglycemic drugs: Secondary | ICD-10-CM | POA: Diagnosis not present

## 2022-07-13 DIAGNOSIS — Z96651 Presence of right artificial knee joint: Secondary | ICD-10-CM | POA: Diagnosis not present

## 2022-07-13 DIAGNOSIS — Z471 Aftercare following joint replacement surgery: Secondary | ICD-10-CM | POA: Diagnosis not present

## 2022-07-13 DIAGNOSIS — E119 Type 2 diabetes mellitus without complications: Secondary | ICD-10-CM | POA: Diagnosis not present

## 2022-07-13 DIAGNOSIS — K219 Gastro-esophageal reflux disease without esophagitis: Secondary | ICD-10-CM | POA: Diagnosis not present

## 2022-07-13 DIAGNOSIS — E7800 Pure hypercholesterolemia, unspecified: Secondary | ICD-10-CM | POA: Diagnosis not present

## 2022-07-16 DIAGNOSIS — Z471 Aftercare following joint replacement surgery: Secondary | ICD-10-CM | POA: Diagnosis not present

## 2022-07-16 DIAGNOSIS — Z7984 Long term (current) use of oral hypoglycemic drugs: Secondary | ICD-10-CM | POA: Diagnosis not present

## 2022-07-16 DIAGNOSIS — E119 Type 2 diabetes mellitus without complications: Secondary | ICD-10-CM | POA: Diagnosis not present

## 2022-07-16 DIAGNOSIS — E7800 Pure hypercholesterolemia, unspecified: Secondary | ICD-10-CM | POA: Diagnosis not present

## 2022-07-16 DIAGNOSIS — E213 Hyperparathyroidism, unspecified: Secondary | ICD-10-CM | POA: Diagnosis not present

## 2022-07-16 DIAGNOSIS — Z96651 Presence of right artificial knee joint: Secondary | ICD-10-CM | POA: Diagnosis not present

## 2022-07-16 DIAGNOSIS — K219 Gastro-esophageal reflux disease without esophagitis: Secondary | ICD-10-CM | POA: Diagnosis not present

## 2022-07-16 DIAGNOSIS — I10 Essential (primary) hypertension: Secondary | ICD-10-CM | POA: Diagnosis not present

## 2022-07-16 DIAGNOSIS — Z7901 Long term (current) use of anticoagulants: Secondary | ICD-10-CM | POA: Diagnosis not present

## 2022-07-17 DIAGNOSIS — Z96651 Presence of right artificial knee joint: Secondary | ICD-10-CM | POA: Diagnosis not present

## 2022-07-17 DIAGNOSIS — M6281 Muscle weakness (generalized): Secondary | ICD-10-CM | POA: Diagnosis not present

## 2022-07-17 DIAGNOSIS — M25661 Stiffness of right knee, not elsewhere classified: Secondary | ICD-10-CM | POA: Diagnosis not present

## 2022-07-17 DIAGNOSIS — M25561 Pain in right knee: Secondary | ICD-10-CM | POA: Diagnosis not present

## 2022-07-17 DIAGNOSIS — G8929 Other chronic pain: Secondary | ICD-10-CM | POA: Diagnosis not present

## 2022-07-25 DIAGNOSIS — M6281 Muscle weakness (generalized): Secondary | ICD-10-CM | POA: Diagnosis not present

## 2022-07-25 DIAGNOSIS — M25561 Pain in right knee: Secondary | ICD-10-CM | POA: Diagnosis not present

## 2022-07-25 DIAGNOSIS — M25661 Stiffness of right knee, not elsewhere classified: Secondary | ICD-10-CM | POA: Diagnosis not present

## 2022-07-25 DIAGNOSIS — Z471 Aftercare following joint replacement surgery: Secondary | ICD-10-CM | POA: Diagnosis not present

## 2022-07-25 DIAGNOSIS — G8929 Other chronic pain: Secondary | ICD-10-CM | POA: Diagnosis not present

## 2022-07-25 DIAGNOSIS — Z96651 Presence of right artificial knee joint: Secondary | ICD-10-CM | POA: Diagnosis not present

## 2022-08-14 DIAGNOSIS — Z96651 Presence of right artificial knee joint: Secondary | ICD-10-CM | POA: Diagnosis not present

## 2022-09-04 DIAGNOSIS — M545 Low back pain, unspecified: Secondary | ICD-10-CM | POA: Diagnosis not present

## 2022-09-04 DIAGNOSIS — R5383 Other fatigue: Secondary | ICD-10-CM | POA: Diagnosis not present

## 2022-09-04 DIAGNOSIS — M79671 Pain in right foot: Secondary | ICD-10-CM | POA: Diagnosis not present

## 2022-09-04 DIAGNOSIS — M25511 Pain in right shoulder: Secondary | ICD-10-CM | POA: Diagnosis not present

## 2022-09-04 DIAGNOSIS — M1990 Unspecified osteoarthritis, unspecified site: Secondary | ICD-10-CM | POA: Diagnosis not present

## 2022-09-04 DIAGNOSIS — E118 Type 2 diabetes mellitus with unspecified complications: Secondary | ICD-10-CM | POA: Diagnosis not present

## 2022-09-04 DIAGNOSIS — R5381 Other malaise: Secondary | ICD-10-CM | POA: Diagnosis not present

## 2022-09-04 DIAGNOSIS — R42 Dizziness and giddiness: Secondary | ICD-10-CM | POA: Diagnosis not present

## 2022-09-04 DIAGNOSIS — R519 Headache, unspecified: Secondary | ICD-10-CM | POA: Diagnosis not present

## 2022-10-02 DIAGNOSIS — Z96651 Presence of right artificial knee joint: Secondary | ICD-10-CM | POA: Diagnosis not present

## 2022-10-10 DIAGNOSIS — Z01818 Encounter for other preprocedural examination: Secondary | ICD-10-CM | POA: Diagnosis not present

## 2022-10-12 DIAGNOSIS — M25511 Pain in right shoulder: Secondary | ICD-10-CM | POA: Diagnosis not present

## 2022-10-12 DIAGNOSIS — Z96611 Presence of right artificial shoulder joint: Secondary | ICD-10-CM | POA: Diagnosis not present

## 2022-10-12 DIAGNOSIS — M5412 Radiculopathy, cervical region: Secondary | ICD-10-CM | POA: Diagnosis not present

## 2022-12-04 DIAGNOSIS — R300 Dysuria: Secondary | ICD-10-CM | POA: Diagnosis not present

## 2022-12-04 DIAGNOSIS — R8290 Unspecified abnormal findings in urine: Secondary | ICD-10-CM | POA: Diagnosis not present

## 2022-12-17 DIAGNOSIS — B0233 Zoster keratitis: Secondary | ICD-10-CM | POA: Diagnosis not present

## 2022-12-17 DIAGNOSIS — Z961 Presence of intraocular lens: Secondary | ICD-10-CM | POA: Diagnosis not present

## 2022-12-17 DIAGNOSIS — E119 Type 2 diabetes mellitus without complications: Secondary | ICD-10-CM | POA: Diagnosis not present

## 2022-12-18 ENCOUNTER — Other Ambulatory Visit: Payer: Self-pay | Admitting: Orthopedic Surgery

## 2022-12-18 DIAGNOSIS — M25511 Pain in right shoulder: Secondary | ICD-10-CM

## 2023-01-01 DIAGNOSIS — R897 Abnormal histological findings in specimens from other organs, systems and tissues: Secondary | ICD-10-CM | POA: Diagnosis not present

## 2023-01-03 DIAGNOSIS — M25511 Pain in right shoulder: Secondary | ICD-10-CM | POA: Diagnosis not present

## 2023-01-11 DIAGNOSIS — Z96651 Presence of right artificial knee joint: Secondary | ICD-10-CM | POA: Diagnosis not present

## 2023-02-26 DIAGNOSIS — E538 Deficiency of other specified B group vitamins: Secondary | ICD-10-CM | POA: Diagnosis not present

## 2023-03-06 ENCOUNTER — Other Ambulatory Visit: Payer: Self-pay | Admitting: Family Medicine

## 2023-03-06 DIAGNOSIS — Z13820 Encounter for screening for osteoporosis: Secondary | ICD-10-CM

## 2023-04-19 DIAGNOSIS — B0233 Zoster keratitis: Secondary | ICD-10-CM | POA: Diagnosis not present

## 2023-04-19 DIAGNOSIS — Z961 Presence of intraocular lens: Secondary | ICD-10-CM | POA: Diagnosis not present

## 2023-04-19 DIAGNOSIS — E119 Type 2 diabetes mellitus without complications: Secondary | ICD-10-CM | POA: Diagnosis not present

## 2023-12-11 ENCOUNTER — Other Ambulatory Visit
Admission: RE | Admit: 2023-12-11 | Discharge: 2023-12-11 | Disposition: A | Discharge status: Home / Self Care | Source: Ambulatory Visit | Attending: Orthopedic Surgery | Admitting: Orthopedic Surgery

## 2023-12-11 DIAGNOSIS — Z96651 Presence of right artificial knee joint: Secondary | ICD-10-CM | POA: Diagnosis present

## 2023-12-11 DIAGNOSIS — M2351 Chronic instability of knee, right knee: Secondary | ICD-10-CM | POA: Diagnosis present

## 2023-12-11 LAB — SYNOVIAL CELL COUNT + DIFF, W/ CRYSTALS
Crystals, Fluid: NONE SEEN
Eosinophils-Synovial: 0 %
Lymphocytes-Synovial Fld: 50 %
Monocyte-Macrophage-Synovial Fluid: 49 %
Neutrophil, Synovial: 1 %
WBC, Synovial: 52 /mm3 (ref 0–200)

## 2023-12-17 ENCOUNTER — Other Ambulatory Visit: Payer: Self-pay | Admitting: Orthopedic Surgery

## 2023-12-25 ENCOUNTER — Other Ambulatory Visit: Payer: Self-pay

## 2023-12-25 ENCOUNTER — Encounter
Admission: RE | Admit: 2023-12-25 | Discharge: 2023-12-25 | Disposition: A | Discharge status: Home / Self Care | Source: Ambulatory Visit | Attending: Orthopedic Surgery | Admitting: Orthopedic Surgery

## 2023-12-25 DIAGNOSIS — Z01812 Encounter for preprocedural laboratory examination: Secondary | ICD-10-CM

## 2023-12-25 DIAGNOSIS — E119 Type 2 diabetes mellitus without complications: Secondary | ICD-10-CM

## 2023-12-25 HISTORY — DX: Other complications of anesthesia, initial encounter: T88.59XA

## 2023-12-25 HISTORY — DX: Chronic instability of knee, right knee: M23.51

## 2023-12-25 NOTE — Patient Instructions (Addendum)
 Your procedure is scheduled on: 01/02/24 - Thursday Report to the Registration Desk on the 1st floor of the [facility]. To find out your arrival time, please call [phone] between 1PM - 3PM on: 01/01/24 - Wednesday If your arrival time is 6:00 am, do not arrive before that time as the [facility] entrance doors do not open until 6:00 am.  REMEMBER: Instructions that are not followed completely may result in serious medical risk, up to and including death; or upon the discretion of your surgeon and anesthesiologist your surgery may need to be rescheduled.  Do not eat food after midnight the night before surgery.  No gum chewing or hard candies.  You may however, drink CLEAR liquids up to 2 hours before you are scheduled to arrive for your surgery. Do not drink anything within 2 hours of your scheduled arrival time.  Clear liquids include: - water    In addition, your doctor has ordered for you to drink the provided:  Gatorade G2 Drinking this carbohydrate drink up to two hours before surgery helps to reduce insulin  resistance and improve patient outcomes. Please complete drinking 2 hours before scheduled arrival time.  One week prior to surgery: Stop Anti-inflammatories (NSAIDS) such as VOLTAREN GEL,Advil, Aleve, Ibuprofen, Motrin, Naproxen, Naprosyn and Aspirin  based products such as Excedrin, Goody's Powder, BC Powder. You may continue to take Tylenol  if needed for pain up until the day of surgery.  Stop ANY OVER THE COUNTER supplements until after surgery   HOLD metFORMIN (GLUCOPHAGE-XR) beginning 07/29, may resume after surgery.  HOLD MOUNJARO 7 days prior to your surgery.  ON THE DAY OF SURGERY ONLY TAKE THESE MEDICATIONS WITH SIPS OF WATER :  omeprazole (PRILOSEC)    No Alcohol for 24 hours before or after surgery.  No Smoking including e-cigarettes for 24 hours before surgery.  No chewable tobacco products for at least 6 hours before surgery.  No nicotine patches on  the day of surgery.  Do not use any recreational drugs for at least a week (preferably 2 weeks) before your surgery.  Please be advised that the combination of cocaine and anesthesia may have negative outcomes, up to and including death. If you test positive for cocaine, your surgery will be cancelled.  On the morning of surgery brush your teeth with toothpaste and water , you may rinse your mouth with mouthwash if you wish. Do not swallow any toothpaste or mouthwash.  Use CHG Soap or wipes as directed on instruction sheet.  Do not wear jewelry, make-up, hairpins, clips or nail polish.  For welded (permanent) jewelry: bracelets, anklets, waist bands, etc.  Please have this removed prior to surgery.  If it is not removed, there is a chance that hospital personnel will need to cut it off on the day of surgery.  Do not wear lotions, powders, or perfumes.   Do not shave body hair from the neck down 48 hours before surgery.  Contact lenses, hearing aids and dentures may not be worn into surgery.  Do not bring valuables to the hospital. [facility] is not responsible for any missing/lost belongings or valuables.   Notify your doctor if there is any change in your medical condition (cold, fever, infection).  Wear comfortable clothing (specific to your surgery type) to the hospital.  After surgery, you can help prevent lung complications by doing breathing exercises.  Take deep breaths and cough every 1-2 hours. Your doctor may order a device called an Incentive Spirometer to help you take deep  breaths. When coughing or sneezing, hold a pillow firmly against your incision with both hands. This is called "splinting." Doing this helps protect your incision. It also decreases belly discomfort.  If you are being admitted to the hospital overnight, leave your suitcase in the car. After surgery it may be brought to your room.  In case of increased patient census, it may be necessary for you, the  patient, to continue your postoperative care in the Same Day Surgery department.  If you are being discharged the day of surgery, you will not be allowed to drive home. You will need a responsible individual to drive you home and stay with you for 24 hours after surgery.   If you are taking public transportation, you will need to have a responsible individual with you.  Please call the Pre-admissions Testing Dept. at [phone] if you have any questions about these instructions.  Surgery Visitation Policy:  Patients having surgery or a procedure may have two visitors.  Children under the age of 51 must have an adult with them who is not the patient.  Inpatient Visitation:    Visiting hours are 7 a.m. to 8 p.m. Up to four visitors are allowed at one time in a patient room. The visitors may rotate out with other people during the day.  One visitor age 27 or older may stay with the patient overnight and must be in the room by 8 p.m.   Merchandiser, retail to address health-related social needs:  [email or web address]    Pre-operative 5 CHG Bath Instructions   You can play a key role in reducing the risk of infection after surgery. Your skin needs to be as free of germs as possible. You can reduce the number of germs on your skin by washing with CHG (chlorhexidine  gluconate) soap before surgery. CHG is an antiseptic soap that kills germs and continues to kill germs even after washing.   DO NOT use if you have an allergy to chlorhexidine /CHG or antibacterial soaps. If your skin becomes reddened or irritated, stop using the CHG and notify one of our RNs at [phone].   Please shower with the CHG soap starting 4 days before surgery using the following schedule: 07/27 - 07/31.    Please keep in mind the following:  DO NOT shave, including legs and underarms, starting the day of your first shower.   You may shave your face at any point before/day of surgery.   Place clean sheets on your bed the day you start using CHG soap. Use a clean washcloth (not used since being washed) for each shower. DO NOT sleep with pets once you start using the CHG.   CHG Shower Instructions:  If you choose to wash your hair and private area, wash first with your normal shampoo/soap.  After you use shampoo/soap, rinse your hair and body thoroughly to remove shampoo/soap residue.  Turn the water  OFF and apply about 3 tablespoons (45 ml) of CHG soap to a CLEAN washcloth.  Apply CHG soap ONLY FROM YOUR NECK DOWN TO YOUR TOES (washing for 3-5 minutes)  DO NOT use CHG soap on face, private areas, open wounds, or sores.  Pay special attention to the area where your surgery is being performed.  If you are having back surgery, having someone wash your back for you may be helpful. Wait 2 minutes after CHG soap is applied, then you may rinse off the CHG soap.  Pat dry with a clean towel  Put  on clean clothes/pajamas   If you choose to wear lotion, please use ONLY the CHG-compatible lotions on the back of this paper.     Additional instructions for the day of surgery: DO NOT APPLY any lotions, deodorants, cologne, or perfumes.   Put on clean/comfortable clothes.  Brush your teeth.  Ask your nurse before applying any prescription medications to the skin.      CHG Compatible Lotions   Aveeno Moisturizing lotion  Cetaphil Moisturizing Cream  Cetaphil Moisturizing Lotion  Clairol Herbal Essence Moisturizing Lotion, Dry Skin  Clairol Herbal Essence Moisturizing Lotion, Extra Dry Skin  Clairol Herbal Essence Moisturizing Lotion, Normal Skin  Curel Age Defying Therapeutic Moisturizing Lotion with Alpha Hydroxy  Curel Extreme Care Body Lotion  Curel Soothing Hands Moisturizing Hand Lotion  Curel Therapeutic Moisturizing Cream, Fragrance-Free  Curel Therapeutic Moisturizing Lotion, Fragrance-Free  Curel Therapeutic Moisturizing Lotion, Original Formula  Eucerin Daily  Replenishing Lotion  Eucerin Dry Skin Therapy Plus Alpha Hydroxy Crme  Eucerin Dry Skin Therapy Plus Alpha Hydroxy Lotion  Eucerin Original Crme  Eucerin Original Lotion  Eucerin Plus Crme Eucerin Plus Lotion  Eucerin TriLipid Replenishing Lotion  Keri Anti-Bacterial Hand Lotion  Keri Deep Conditioning Original Lotion Dry Skin Formula Softly Scented  Keri Deep Conditioning Original Lotion, Fragrance Free Sensitive Skin Formula  Keri Lotion Fast Absorbing Fragrance Free Sensitive Skin Formula  Keri Lotion Fast Absorbing Softly Scented Dry Skin Formula  Keri Original Lotion  Keri Skin Renewal Lotion Keri Silky Smooth Lotion  Keri Silky Smooth Sensitive Skin Lotion  Nivea Body Creamy Conditioning Oil  Nivea Body Extra Enriched Lotion  Nivea Body Original Lotion  Nivea Body Sheer Moisturizing Lotion Nivea Crme  Nivea Skin Firming Lotion  NutraDerm 30 Skin Lotion  NutraDerm Skin Lotion  NutraDerm Therapeutic Skin Cream  NutraDerm Therapeutic Skin Lotion  ProShield Protective Hand Cream  Provon moisturizing lotion  How to Use an Incentive Spirometer  An incentive spirometer is a tool that measures how well you are filling your lungs with each breath. Learning to take long, deep breaths using this tool can help you keep your lungs clear and active. This may help to reverse or lessen your chance of developing breathing (pulmonary) problems, especially infection. You may be asked to use a spirometer: After a surgery. If you have a lung problem or a history of smoking. After a long period of time when you have been unable to move or be active. If the spirometer includes an indicator to show the highest number that you have reached, your health care provider or respiratory therapist will help you set a goal. Keep a log of your progress as told by your health care provider. What are the risks? Breathing too quickly may cause dizziness or cause you to pass out. Take your time so you do not  get dizzy or light-headed. If you are in pain, you may need to take pain medicine before doing incentive spirometry. It is harder to take a deep breath if you are having pain. How to use your incentive spirometer  Sit up on the edge of your bed or on a chair. Hold the incentive spirometer so that it is in an upright position. Before you use the spirometer, breathe out normally. Place the mouthpiece in your mouth. Make sure your lips are closed tightly around it. Breathe in slowly and as deeply as you can through your mouth, causing the piston or the ball to rise toward the top of the chamber. Hold your  breath for 3-5 seconds, or for as long as possible. If the spirometer includes a coach indicator, use this to guide you in breathing. Slow down your breathing if the indicator goes above the marked areas. Remove the mouthpiece from your mouth and breathe out normally. The piston or ball will return to the bottom of the chamber. Rest for a few seconds, then repeat the steps 10 or more times. Take your time and take a few normal breaths between deep breaths so that you do not get dizzy or light-headed. Do this every 1-2 hours when you are awake. If the spirometer includes a goal marker to show the highest number you have reached (best effort), use this as a goal to work toward during each repetition. After each set of 10 deep breaths, cough a few times. This will help to make sure that your lungs are clear. If you have an incision on your chest or abdomen from surgery, place a pillow or a rolled-up towel firmly against the incision when you cough. This can help to reduce pain while taking deep breaths and coughing. General tips When you are able to get out of bed: Walk around often. Continue to take deep breaths and cough in order to clear your lungs. Keep using the incentive spirometer until your health care provider says it is okay to stop using it. If you have been in the hospital, you may be  told to keep using the spirometer at home. Contact a health care provider if: You are having difficulty using the spirometer. You have trouble using the spirometer as often as instructed. Your pain medicine is not giving enough relief for you to use the spirometer as told. You have a fever. Get help right away if: You develop shortness of breath. You develop a cough with bloody mucus from the lungs. You have fluid or blood coming from an incision site after you cough. Summary An incentive spirometer is a tool that can help you learn to take long, deep breaths to keep your lungs clear and active. You may be asked to use a spirometer after a surgery, if you have a lung problem or a history of smoking, or if you have been inactive for a long period of time. Use your incentive spirometer as instructed every 1-2 hours while you are awake. If you have an incision on your chest or abdomen, place a pillow or a rolled-up towel firmly against your incision when you cough. This will help to reduce pain. Get help right away if you have shortness of breath, you cough up bloody mucus, or blood comes from your incision when you cough. This information is not intended to replace advice given to you by your health care provider. Make sure you discuss any questions you have with your health care provider. Document Revised: 08/10/2019 Document Reviewed: 08/10/2019 Elsevier Patient Education  2023 Elsevier Inc.    POLAR CARE INFORMATION  MassAdvertisement.it  How to use [facility] Therapy [facility]?  YouTube   ShippingScam.co.uk  OPERATING INSTRUCTIONS  Start the product With dry hands, connect the transformer to the electrical connection located on the top of the cooler. Next, plug the transformer into an appropriate electrical outlet. The unit will automatically start running at this point.  To stop the pump, disconnect electrical power.  Unplug to stop the product  when not in use. Unplugging the Polar Care unit turns it off. Always unplug immediately after use. Never leave it plugged in while  unattended. Remove pad.    FIRST ADD WATER  TO FILL LINE, THEN ICE---Replace ice when existing ice is almost melted  1 Discuss Treatment with your Licensed Health Care Practitioner and Use Only as Prescribed 2 Apply Insulation Barrier & Cold Therapy Pad 3 Check for Moisture 4 Inspect Skin Regularly  Tips and Trouble Shooting Usage Tips 1. Use cubed or chunked ice for optimal performance. 2. It is recommended to drain the Pad between uses. To drain the pad, hold the Pad upright with the hose pointed toward the ground. Depress the black plunger and allow water  to drain out. 3. You may disconnect the Pad from the unit without removing the pad from the affected area by depressing the silver tabs on the hose coupling and gently pulling the hoses apart. The Pad and unit will seal itself and will not leak. Note: Some dripping during release is normal. 4. DO NOT RUN PUMP WITHOUT WATER ! The pump in this unit is designed to run with water . Running the unit without water  will cause permanent damage to the pump. 5. Unplug unit before removing lid.  TROUBLESHOOTING GUIDE Pump not running, Water  not flowing to the pad, Pad is not getting cold 1. Make sure the transformer is plugged into the wall outlet. 2. Confirm that the ice and water  are filled to the indicated levels. 3. Make sure there are no kinks in the pad. 4. Gently pull on the blue tube to make sure the tube/pad junction is straight. 5. Remove the pad from the treatment site and ll it while the pad is lying at; then reapply. 6. Confirm that the pad couplings are securely attached to the unit. Listen for the double clicks (Figure 1) to confirm the pad couplings are securely attached.  Leaks    Note: Some condensation on the lines, controller, and pads is unavoidable, especially in warmer climates. 1. If using a [facility]  Polar Care Cold Therapy unit with a detachable Cold Therapy Pad, and a leak exists (other than condensation on the lines) disconnect the pad couplings. Make sure the silver tabs on the couplings are depressed before reconnecting the pad to the pump hose; then confirm both sides of the coupling are properly clicked in. 2. If the coupling continues to leak or a leak is detected in the pad itself, stop using it and call [employer] Customer Care at [phone].  Cleaning After use, empty and dry the unit with a soft cloth. Warm water  and mild detergent may be used occasionally to clean the pump and tubes.  WARNING: The Polar Care Cube can be cold enough to cause serious injury, including full skin necrosis. Follow these Operating Instructions, and carefully read the Product Insert (see pouch on side of unit) and the Cold Therapy Pad Fitting Instructions (provided with each Cold Therapy Pad) prior to use.       Preoperative Educational Videos for Total Hip, Knee and Shoulder Replacements  To better prepare for surgery, please view our videos that explain the physical activity and discharge planning required to have the best surgical recovery at [facility].  [email or web address]  Questions? Call [phone] or email [email or web address]

## 2023-12-26 ENCOUNTER — Encounter
Admission: RE | Admit: 2023-12-26 | Discharge: 2023-12-26 | Disposition: A | Discharge status: Home / Self Care | Source: Ambulatory Visit | Attending: Orthopedic Surgery | Admitting: Orthopedic Surgery

## 2023-12-26 DIAGNOSIS — Z01818 Encounter for other preprocedural examination: Secondary | ICD-10-CM | POA: Diagnosis present

## 2023-12-26 DIAGNOSIS — R8290 Unspecified abnormal findings in urine: Secondary | ICD-10-CM | POA: Diagnosis not present

## 2023-12-26 DIAGNOSIS — Z01812 Encounter for preprocedural laboratory examination: Secondary | ICD-10-CM

## 2023-12-26 DIAGNOSIS — Z96651 Presence of right artificial knee joint: Secondary | ICD-10-CM

## 2023-12-26 LAB — URINALYSIS, ROUTINE W REFLEX MICROSCOPIC
Bacteria, UA: NONE SEEN
Bilirubin Urine: NEGATIVE
Glucose, UA: NEGATIVE mg/dL
Hgb urine dipstick: NEGATIVE
Ketones, ur: NEGATIVE mg/dL
Nitrite: NEGATIVE
Protein, ur: NEGATIVE mg/dL
Specific Gravity, Urine: 1.012 (ref 1.005–1.030)
pH: 5 (ref 5.0–8.0)

## 2023-12-26 LAB — COMPREHENSIVE METABOLIC PANEL WITH GFR
ALT: 22 U/L (ref 0–44)
AST: 25 U/L (ref 15–41)
Albumin: 4 g/dL (ref 3.5–5.0)
Alkaline Phosphatase: 109 U/L (ref 38–126)
Anion gap: 11 (ref 5–15)
BUN: 13 mg/dL (ref 8–23)
CO2: 23 mmol/L (ref 22–32)
Calcium: 11.1 mg/dL — ABNORMAL HIGH (ref 8.9–10.3)
Chloride: 106 mmol/L (ref 98–111)
Creatinine, Ser: 1.07 mg/dL — ABNORMAL HIGH (ref 0.44–1.00)
GFR, Estimated: 53 mL/min — ABNORMAL LOW (ref >60)
Glucose, Bld: 184 mg/dL — ABNORMAL HIGH (ref 70–99)
Potassium: 3.7 mmol/L (ref 3.5–5.1)
Sodium: 140 mmol/L (ref 135–145)
Total Bilirubin: 0.7 mg/dL (ref 0.0–1.2)
Total Protein: 7 g/dL (ref 6.5–8.1)

## 2023-12-26 LAB — SURGICAL PCR SCREEN
MRSA, PCR: NEGATIVE
Staphylococcus aureus: NEGATIVE

## 2023-12-26 LAB — CBC WITH DIFFERENTIAL/PLATELET
Abs Immature Granulocytes: 0.02 K/uL (ref 0.00–0.07)
Basophils Absolute: 0.1 K/uL (ref 0.0–0.1)
Basophils Relative: 1 %
Eosinophils Absolute: 0.3 K/uL (ref 0.0–0.5)
Eosinophils Relative: 4 %
HCT: 42.1 % (ref 36.0–46.0)
Hemoglobin: 13.6 g/dL (ref 12.0–15.0)
Immature Granulocytes: 0 %
Lymphocytes Relative: 23 %
Lymphs Abs: 1.8 K/uL (ref 0.7–4.0)
MCH: 30.9 pg (ref 26.0–34.0)
MCHC: 32.3 g/dL (ref 30.0–36.0)
MCV: 95.7 fL (ref 80.0–100.0)
Monocytes Absolute: 0.4 K/uL (ref 0.1–1.0)
Monocytes Relative: 6 %
Neutro Abs: 5.1 K/uL (ref 1.7–7.7)
Neutrophils Relative %: 66 %
Platelets: 224 K/uL (ref 150–400)
RBC: 4.4 MIL/uL (ref 3.87–5.11)
RDW: 12.2 % (ref 11.5–15.5)
WBC: 7.7 K/uL (ref 4.0–10.5)
nRBC: 0 % (ref 0.0–0.2)

## 2023-12-26 LAB — TYPE AND SCREEN
ABO/RH(D): O POS
Antibody Screen: NEGATIVE

## 2023-12-28 ENCOUNTER — Ambulatory Visit: Payer: Self-pay | Admitting: Urgent Care

## 2023-12-28 LAB — URINE CULTURE

## 2024-01-01 MED ORDER — ORAL CARE MOUTH RINSE: 15.0000 mL | Freq: Once | OROMUCOSAL | Status: AC

## 2024-01-01 MED ORDER — SODIUM CHLORIDE 0.9 % IV SOLN: INTRAVENOUS | Status: DC

## 2024-01-01 MED ORDER — CHLORHEXIDINE GLUCONATE 0.12 % MT SOLN
15.0000 mL | Freq: Once | OROMUCOSAL | Status: AC
  Administered 2024-01-02: 15 mL via OROMUCOSAL

## 2024-01-01 MED ORDER — CEFAZOLIN SODIUM-DEXTROSE 2-4 GM/100ML-% IV SOLN
2.0000 g | INTRAVENOUS | Status: AC
  Administered 2024-01-02: 2 g via INTRAVENOUS

## 2024-01-01 MED ORDER — DEXAMETHASONE SODIUM PHOSPHATE 10 MG/ML IJ SOLN
8.0000 mg | Freq: Once | INTRAMUSCULAR | Status: AC
  Administered 2024-01-02: 10 mg via INTRAVENOUS

## 2024-01-02 ENCOUNTER — Inpatient Hospital Stay

## 2024-01-02 ENCOUNTER — Other Ambulatory Visit: Payer: Self-pay

## 2024-01-02 ENCOUNTER — Inpatient Hospital Stay: Admitting: Anesthesiology

## 2024-01-02 ENCOUNTER — Encounter
Admission: RE | Disposition: A | Discharge status: Home Health | Payer: Self-pay | Source: Home / Self Care | Attending: Orthopedic Surgery

## 2024-01-02 ENCOUNTER — Encounter: Payer: Self-pay | Admitting: Orthopedic Surgery

## 2024-01-02 ENCOUNTER — Inpatient Hospital Stay
Admission: RE | Admit: 2024-01-02 | Discharge: 2024-01-03 | DRG: 468 | Disposition: A | Discharge status: Home Health | Attending: Orthopedic Surgery | Admitting: Orthopedic Surgery

## 2024-01-02 ENCOUNTER — Inpatient Hospital Stay: Payer: Self-pay | Admitting: Urgent Care

## 2024-01-02 DIAGNOSIS — Z8249 Family history of ischemic heart disease and other diseases of the circulatory system: Secondary | ICD-10-CM | POA: Diagnosis not present

## 2024-01-02 DIAGNOSIS — M25361 Other instability, right knee: Secondary | ICD-10-CM | POA: Diagnosis present

## 2024-01-02 DIAGNOSIS — I10 Essential (primary) hypertension: Secondary | ICD-10-CM | POA: Diagnosis present

## 2024-01-02 DIAGNOSIS — Z8619 Personal history of other infectious and parasitic diseases: Secondary | ICD-10-CM

## 2024-01-02 DIAGNOSIS — Z800 Family history of malignant neoplasm of digestive organs: Secondary | ICD-10-CM

## 2024-01-02 DIAGNOSIS — Y792 Prosthetic and other implants, materials and accessory orthopedic devices associated with adverse incidents: Secondary | ICD-10-CM | POA: Diagnosis present

## 2024-01-02 DIAGNOSIS — Z96611 Presence of right artificial shoulder joint: Secondary | ICD-10-CM | POA: Diagnosis present

## 2024-01-02 DIAGNOSIS — Z7985 Long-term (current) use of injectable non-insulin antidiabetic drugs: Secondary | ICD-10-CM

## 2024-01-02 DIAGNOSIS — Z7984 Long term (current) use of oral hypoglycemic drugs: Secondary | ICD-10-CM | POA: Diagnosis not present

## 2024-01-02 DIAGNOSIS — E119 Type 2 diabetes mellitus without complications: Secondary | ICD-10-CM | POA: Diagnosis present

## 2024-01-02 DIAGNOSIS — Z9841 Cataract extraction status, right eye: Secondary | ICD-10-CM

## 2024-01-02 DIAGNOSIS — T8489XA Other specified complication of internal orthopedic prosthetic devices, implants and grafts, initial encounter: Principal | ICD-10-CM | POA: Diagnosis present

## 2024-01-02 DIAGNOSIS — Z8673 Personal history of transient ischemic attack (TIA), and cerebral infarction without residual deficits: Secondary | ICD-10-CM | POA: Diagnosis not present

## 2024-01-02 DIAGNOSIS — Z8262 Family history of osteoporosis: Secondary | ICD-10-CM | POA: Diagnosis not present

## 2024-01-02 DIAGNOSIS — Z79899 Other long term (current) drug therapy: Secondary | ICD-10-CM | POA: Diagnosis not present

## 2024-01-02 DIAGNOSIS — Z8269 Family history of other diseases of the musculoskeletal system and connective tissue: Secondary | ICD-10-CM | POA: Diagnosis not present

## 2024-01-02 DIAGNOSIS — Z820 Family history of epilepsy and other diseases of the nervous system: Secondary | ICD-10-CM

## 2024-01-02 DIAGNOSIS — M2351 Chronic instability of knee, right knee: Secondary | ICD-10-CM | POA: Diagnosis present

## 2024-01-02 DIAGNOSIS — Z833 Family history of diabetes mellitus: Secondary | ICD-10-CM | POA: Diagnosis not present

## 2024-01-02 DIAGNOSIS — Z96651 Presence of right artificial knee joint: Principal | ICD-10-CM

## 2024-01-02 DIAGNOSIS — Z8261 Family history of arthritis: Secondary | ICD-10-CM | POA: Diagnosis not present

## 2024-01-02 DIAGNOSIS — E7800 Pure hypercholesterolemia, unspecified: Secondary | ICD-10-CM | POA: Diagnosis present

## 2024-01-02 DIAGNOSIS — Z9842 Cataract extraction status, left eye: Secondary | ICD-10-CM | POA: Diagnosis not present

## 2024-01-02 LAB — GLUCOSE, CAPILLARY
Glucose-Capillary: 145 mg/dL — ABNORMAL HIGH (ref 70–99)
Glucose-Capillary: 165 mg/dL — ABNORMAL HIGH (ref 70–99)

## 2024-01-02 SURGERY — REVISION, TOTAL ARTHROPLASTY, KNEE
Anesthesia: Spinal | Site: Knee | Laterality: Right

## 2024-01-02 MED ORDER — ROSUVASTATIN CALCIUM 5 MG PO TABS
5.0000 mg | ORAL_TABLET | Freq: Every evening | ORAL | Status: DC
  Administered 2024-01-02: 5 mg via ORAL
  Filled 2024-01-02: qty 1

## 2024-01-02 MED ORDER — PROPOFOL 10 MG/ML IV BOLUS
INTRAVENOUS | Status: AC
Start: 2024-01-02 — End: 2024-01-02
  Filled 2024-01-02: qty 20

## 2024-01-02 MED ORDER — PROPOFOL 1000 MG/100ML IV EMUL
INTRAVENOUS | Status: AC
Start: 2024-01-02 — End: 2024-01-02
  Filled 2024-01-02: qty 100

## 2024-01-02 MED ORDER — FENTANYL CITRATE (PF) 100 MCG/2ML IJ SOLN: 25.0000 ug | INTRAMUSCULAR | Status: DC | PRN

## 2024-01-02 MED ORDER — PROPOFOL 10 MG/ML IV BOLUS
INTRAVENOUS | Status: DC | PRN
  Administered 2024-01-02: 20 mg via INTRAVENOUS
  Administered 2024-01-02 (×2): 10 mg via INTRAVENOUS

## 2024-01-02 MED ORDER — SODIUM CHLORIDE (PF) 0.9 % IJ SOLN
INTRAMUSCULAR | Status: AC
Start: 2024-01-02 — End: 2024-01-02
  Filled 2024-01-02: qty 20

## 2024-01-02 MED ORDER — LIDOCAINE HCL (CARDIAC) PF 100 MG/5ML IV SOSY
PREFILLED_SYRINGE | INTRAVENOUS | Status: DC | PRN
  Administered 2024-01-02: 40 mg via INTRAVENOUS

## 2024-01-02 MED ORDER — SODIUM CHLORIDE 0.9 % IR SOLN
Status: DC | PRN
Start: 2024-01-02 — End: 2024-01-02
  Administered 2024-01-02: 3000 mL

## 2024-01-02 MED ORDER — HYDROCODONE-ACETAMINOPHEN 5-325 MG PO TABS
1.0000 | ORAL_TABLET | ORAL | Status: DC | PRN
  Administered 2024-01-02 – 2024-01-03 (×2): 1 via ORAL
  Filled 2024-01-02 (×2): qty 1

## 2024-01-02 MED ORDER — FENTANYL CITRATE (PF) 100 MCG/2ML IJ SOLN
INTRAMUSCULAR | Status: AC
Start: 2024-01-02 — End: 2024-01-02
  Filled 2024-01-02: qty 2

## 2024-01-02 MED ORDER — METHYLENE BLUE (ANTIDOTE) 1 % IV SOLN
INTRAVENOUS | Status: AC
Start: 2024-01-02 — End: 2024-01-02
  Filled 2024-01-02: qty 10

## 2024-01-02 MED ORDER — DULOXETINE HCL 30 MG PO CPEP
60.0000 mg | ORAL_CAPSULE | Freq: Every evening | ORAL | Status: DC
  Administered 2024-01-02: 60 mg via ORAL
  Filled 2024-01-02: qty 2

## 2024-01-02 MED ORDER — BUPIVACAINE HCL (PF) 0.5 % IJ SOLN
INTRAMUSCULAR | Status: AC
  Filled 2024-01-02: qty 10

## 2024-01-02 MED ORDER — FENTANYL CITRATE (PF) 100 MCG/2ML IJ SOLN
INTRAMUSCULAR | Status: DC | PRN
  Administered 2024-01-02: 25 ug via INTRAVENOUS

## 2024-01-02 MED ORDER — PROPOFOL 500 MG/50ML IV EMUL
INTRAVENOUS | Status: DC | PRN
  Administered 2024-01-02: 100 ug/kg/min via INTRAVENOUS

## 2024-01-02 MED ORDER — PHENOL 1.4 % MT LIQD: 1.0000 | OROMUCOSAL | Status: DC | PRN

## 2024-01-02 MED ORDER — MENTHOL 3 MG MT LOZG
1.0000 | LOZENGE | OROMUCOSAL | Status: DC | PRN
Start: 2024-01-02 — End: 2024-01-03

## 2024-01-02 MED ORDER — CEFAZOLIN SODIUM-DEXTROSE 2-4 GM/100ML-% IV SOLN
INTRAVENOUS | Status: AC
  Filled 2024-01-02: qty 100

## 2024-01-02 MED ORDER — ACETAMINOPHEN 10 MG/ML IV SOLN: 1000.0000 mg | Freq: Once | INTRAVENOUS | Status: DC | PRN

## 2024-01-02 MED ORDER — ACETAMINOPHEN 500 MG PO TABS
1000.0000 mg | ORAL_TABLET | Freq: Three times a day (TID) | ORAL | Status: DC
  Administered 2024-01-02 – 2024-01-03 (×2): 1000 mg via ORAL
  Filled 2024-01-02 (×2): qty 2

## 2024-01-02 MED ORDER — TRANEXAMIC ACID-NACL 1000-0.7 MG/100ML-% IV SOLN
INTRAVENOUS | Status: AC
  Filled 2024-01-02: qty 100

## 2024-01-02 MED ORDER — ONDANSETRON HCL 4 MG PO TABS: 4.0000 mg | ORAL_TABLET | Freq: Four times a day (QID) | ORAL | Status: DC | PRN

## 2024-01-02 MED ORDER — ONDANSETRON HCL 4 MG/2ML IJ SOLN
INTRAMUSCULAR | Status: DC | PRN
  Administered 2024-01-02: 4 mg via INTRAVENOUS

## 2024-01-02 MED ORDER — OXYCODONE HCL 5 MG PO TABS: 5.0000 mg | ORAL_TABLET | Freq: Once | ORAL | Status: DC | PRN

## 2024-01-02 MED ORDER — TRAMADOL HCL 50 MG PO TABS
50.0000 mg | ORAL_TABLET | Freq: Four times a day (QID) | ORAL | Status: DC | PRN
  Filled 2024-01-02: qty 1

## 2024-01-02 MED ORDER — MORPHINE SULFATE (PF) 2 MG/ML IV SOLN: 0.5000 mg | INTRAVENOUS | Status: DC | PRN

## 2024-01-02 MED ORDER — ENOXAPARIN SODIUM 30 MG/0.3ML IJ SOSY
30.0000 mg | PREFILLED_SYRINGE | INTRAMUSCULAR | Status: DC
  Administered 2024-01-03: 30 mg via SUBCUTANEOUS
  Filled 2024-01-02: qty 0.3

## 2024-01-02 MED ORDER — LACTATED RINGERS IV SOLN: INTRAVENOUS | Status: DC

## 2024-01-02 MED ORDER — BUPIVACAINE-EPINEPHRINE (PF) 0.25% -1:200000 IJ SOLN
INTRAMUSCULAR | Status: DC | PRN
  Administered 2024-01-02: 50 mL

## 2024-01-02 MED ORDER — ACETAMINOPHEN 10 MG/ML IV SOLN
INTRAVENOUS | Status: DC | PRN
Start: 2024-01-02 — End: 2024-01-02
  Administered 2024-01-02: 1000 mg via INTRAVENOUS

## 2024-01-02 MED ORDER — TRANEXAMIC ACID-NACL 1000-0.7 MG/100ML-% IV SOLN
1000.0000 mg | INTRAVENOUS | Status: AC
  Administered 2024-01-02 (×2): 1000 mg via INTRAVENOUS

## 2024-01-02 MED ORDER — METOCLOPRAMIDE HCL 10 MG PO TABS: 5.0000 mg | ORAL_TABLET | Freq: Three times a day (TID) | ORAL | Status: DC | PRN

## 2024-01-02 MED ORDER — ONDANSETRON HCL 4 MG/2ML IJ SOLN: 4.0000 mg | Freq: Once | INTRAMUSCULAR | Status: DC | PRN

## 2024-01-02 MED ORDER — BUPIVACAINE HCL (PF) 0.5 % IJ SOLN
INTRAMUSCULAR | Status: DC | PRN
Start: 2024-01-02 — End: 2024-01-02
  Administered 2024-01-02: 3 mL

## 2024-01-02 MED ORDER — CHLORHEXIDINE GLUCONATE 0.12 % MT SOLN
OROMUCOSAL | Status: AC
  Filled 2024-01-02: qty 15

## 2024-01-02 MED ORDER — DOCUSATE SODIUM 100 MG PO CAPS
100.0000 mg | ORAL_CAPSULE | Freq: Two times a day (BID) | ORAL | Status: DC
  Administered 2024-01-02 – 2024-01-03 (×3): 100 mg via ORAL
  Filled 2024-01-02 (×3): qty 1

## 2024-01-02 MED ORDER — SODIUM CHLORIDE 0.9 % IV SOLN: INTRAVENOUS | Status: DC

## 2024-01-02 MED ORDER — 0.9 % SODIUM CHLORIDE (POUR BTL) OPTIME
TOPICAL | Status: DC | PRN
Start: 2024-01-02 — End: 2024-01-02
  Administered 2024-01-02: 500 mL

## 2024-01-02 MED ORDER — PANTOPRAZOLE SODIUM 40 MG PO TBEC
40.0000 mg | DELAYED_RELEASE_TABLET | Freq: Every day | ORAL | Status: DC
  Administered 2024-01-02 – 2024-01-03 (×2): 40 mg via ORAL
  Filled 2024-01-02 (×2): qty 1

## 2024-01-02 MED ORDER — ACETAMINOPHEN 10 MG/ML IV SOLN
INTRAVENOUS | Status: AC
  Filled 2024-01-02: qty 100

## 2024-01-02 MED ORDER — OXYCODONE HCL 5 MG/5ML PO SOLN: 5.0000 mg | Freq: Once | ORAL | Status: DC | PRN

## 2024-01-02 MED ORDER — CEFAZOLIN SODIUM-DEXTROSE 2-4 GM/100ML-% IV SOLN
2.0000 g | Freq: Four times a day (QID) | INTRAVENOUS | Status: AC
  Administered 2024-01-02 (×2): 2 g via INTRAVENOUS
  Filled 2024-01-02: qty 100

## 2024-01-02 MED ORDER — SURGIPHOR WOUND IRRIGATION SYSTEM - OPTIME: TOPICAL | Status: DC | PRN

## 2024-01-02 MED ORDER — ONDANSETRON HCL 4 MG/2ML IJ SOLN: 4.0000 mg | Freq: Four times a day (QID) | INTRAMUSCULAR | Status: DC | PRN

## 2024-01-02 MED ORDER — IRBESARTAN 150 MG PO TABS
150.0000 mg | ORAL_TABLET | Freq: Every day | ORAL | Status: DC
  Administered 2024-01-02 – 2024-01-03 (×2): 150 mg via ORAL
  Filled 2024-01-02 (×2): qty 1

## 2024-01-02 MED ORDER — BUPIVACAINE-EPINEPHRINE (PF) 0.25% -1:200000 IJ SOLN
INTRAMUSCULAR | Status: AC
Start: 2024-01-02 — End: 2024-01-02
  Filled 2024-01-02: qty 30

## 2024-01-02 MED ORDER — METOCLOPRAMIDE HCL 5 MG/ML IJ SOLN: 5.0000 mg | Freq: Three times a day (TID) | INTRAMUSCULAR | Status: DC | PRN

## 2024-01-02 MED ORDER — GABAPENTIN 100 MG PO CAPS
300.0000 mg | ORAL_CAPSULE | Freq: Every evening | ORAL | Status: DC
  Administered 2024-01-02: 300 mg via ORAL
  Filled 2024-01-02: qty 3

## 2024-01-02 MED ORDER — BUPIVACAINE LIPOSOME 1.3 % IJ SUSP
INTRAMUSCULAR | Status: AC
  Filled 2024-01-02: qty 20

## 2024-01-02 MED ORDER — ACETAMINOPHEN 325 MG PO TABS: 325.0000 mg | ORAL_TABLET | Freq: Four times a day (QID) | ORAL | Status: DC | PRN

## 2024-01-02 SURGICAL SUPPLY — 71 items
BLADE FLEX CHISEL 8 2.5 (MISCELLANEOUS) IMPLANT
BLADE SAGITTAL WIDE XTHICK NO (BLADE) IMPLANT
BLADE SAW 90X13X1.19 OSCILLAT (BLADE) IMPLANT
BLADE SAW GIGLI 510 (BLADE) IMPLANT
BLADE SAW SAG 25X90X1.19 (BLADE) IMPLANT
BLADE SW THK.38XMED LNG THN (BLADE) IMPLANT
BNDG ELASTIC 6INX 5YD STR LF (GAUZE/BANDAGES/DRESSINGS) ×1 IMPLANT
BOWL CEMENT MIX W/ADAPTER (MISCELLANEOUS) ×1 IMPLANT
BRUSH SCRUB EZ PLAIN DRY (MISCELLANEOUS) ×1 IMPLANT
CEMENT BONE R 1X40 (Cement) IMPLANT
CHLORAPREP W/TINT 26 (MISCELLANEOUS) ×2 IMPLANT
CNTNR URN SCR LID CUP LEK RST (MISCELLANEOUS) IMPLANT
COMPONENT FEM REV CMT STD 7 RT (Joint) IMPLANT
COOLER ICEMAN CLASSIC (MISCELLANEOUS) ×1 IMPLANT
CUFF TRNQT CYL 24X4X16.5-23 (TOURNIQUET CUFF) IMPLANT
CUFF TRNQT CYL 30X4X21-28X (TOURNIQUET CUFF) IMPLANT
DERMABOND ADVANCED .7 DNX12 (GAUZE/BANDAGES/DRESSINGS) IMPLANT
DRAPE SHEET LG 3/4 BI-LAMINATE (DRAPES) ×2 IMPLANT
DRSG MEPILEX SACRM 8.7X9.8 (GAUZE/BANDAGES/DRESSINGS) ×1 IMPLANT
DRSG OPSITE POSTOP 4X10 (GAUZE/BANDAGES/DRESSINGS) IMPLANT
DRSG OPSITE POSTOP 4X8 (GAUZE/BANDAGES/DRESSINGS) IMPLANT
ELECTRODE REM PT RTRN 9FT ADLT (ELECTROSURGICAL) ×1 IMPLANT
EVACUATOR 1/8 PVC DRAIN (DRAIN) IMPLANT
GLOVE BIO SURGEON STRL SZ8 (GLOVE) ×1 IMPLANT
GLOVE BIOGEL PI IND STRL 8 (GLOVE) ×1 IMPLANT
GLOVE PI ORTHO PRO STRL 7.5 (GLOVE) ×2 IMPLANT
GLOVE PI ORTHO PRO STRL SZ8 (GLOVE) ×2 IMPLANT
GLOVE SURG SYN 7.5 PF PI (GLOVE) ×1 IMPLANT
GOWN SRG XL LVL 3 NONREINFORCE (GOWNS) ×1 IMPLANT
GOWN STRL REUS W/ TWL LRG LVL3 (GOWN DISPOSABLE) ×1 IMPLANT
GOWN STRL REUS W/ TWL XL LVL3 (GOWN DISPOSABLE) ×1 IMPLANT
HOLDER FOLEY CATH W/STRAP (MISCELLANEOUS) ×1 IMPLANT
HOOD PEEL AWAY T7 (MISCELLANEOUS) ×2 IMPLANT
INSERT PSN CPS VE RIGHT 12M (Insert) IMPLANT
INSTRUMENT SCRW HEX REV 3.5X48 (ORTHOPEDIC DISPOSABLE SUPPLIES) IMPLANT
KIT TURNOVER KIT A (KITS) ×1 IMPLANT
MANIFOLD NEPTUNE II (INSTRUMENTS) ×1 IMPLANT
MARKER SKIN DUAL TIP RULER LAB (MISCELLANEOUS) ×1 IMPLANT
MAT ABSORB FLUID 56X50 GRAY (MISCELLANEOUS) ×1 IMPLANT
NDL FILTER BLUNT 18X1 1/2 (NEEDLE) IMPLANT
NDL HYPO 21X1.5 SAFETY (NEEDLE) IMPLANT
NDL SAFETY ECLIPSE 18X1.5 (NEEDLE) IMPLANT
NEEDLE FILTER BLUNT 18X1 1/2 (NEEDLE) IMPLANT
NEEDLE HYPO 21X1.5 SAFETY (NEEDLE) ×1 IMPLANT
NS IRRIG 500ML POUR BTL (IV SOLUTION) ×1 IMPLANT
OSTEOTOME THIN 10 5 (MISCELLANEOUS) IMPLANT
OSTEOTOME THIN 8 3 (MISCELLANEOUS) IMPLANT
PACK TOTAL KNEE (MISCELLANEOUS) ×1 IMPLANT
PAD ARMBOARD POSITIONER FOAM (MISCELLANEOUS) ×3 IMPLANT
PAD COLD UNI WRAP-ON (PAD) ×1 IMPLANT
PENCIL SMOKE EVACUATOR (MISCELLANEOUS) ×1 IMPLANT
PIN DRILL HDLS TROCAR 75 4PK (PIN) IMPLANT
SCREW HEX HEADED 3.5X27 DISP (ORTHOPEDIC DISPOSABLE SUPPLIES) IMPLANT
SOL .9 NS 3000ML IRR UROMATIC (IV SOLUTION) ×1 IMPLANT
SOLUTION IRRIG SURGIPHOR (IV SOLUTION) ×1 IMPLANT
STEM FEM EXT PERS 75X14 (Stem) IMPLANT
STOCKINETTE IMPERV 14X48 (MISCELLANEOUS) ×1 IMPLANT
SUT STRATAFIX 14 PDO 36 VLT (SUTURE) IMPLANT
SUT VIC AB 0 CT1 36 (SUTURE) IMPLANT
SUT VIC AB 1 CT1 36 (SUTURE) IMPLANT
SUT VIC AB 2-0 CT2 27 (SUTURE) IMPLANT
SUTURE STRATA SPIR 4-0 18 (SUTURE) IMPLANT
SWAB CULTURE AMIES ANAERIB BLU (MISCELLANEOUS) IMPLANT
SYR 20ML LL LF (SYRINGE) IMPLANT
SYR 3ML LL SCALE MARK (SYRINGE) IMPLANT
TAPE CLOTH 3X10 WHT NS LF (GAUZE/BANDAGES/DRESSINGS) ×1 IMPLANT
TIP FAN IRRIG PULSAVAC PLUS (DISPOSABLE) ×1 IMPLANT
TOWEL OR 17X26 4PK STRL BLUE (TOWEL DISPOSABLE) IMPLANT
TRAP FLUID SMOKE EVACUATOR (MISCELLANEOUS) ×1 IMPLANT
TRAY FOLEY SLVR 16FR LF STAT (SET/KITS/TRAYS/PACK) ×1 IMPLANT
WATER STERILE IRR 1000ML POUR (IV SOLUTION) ×1 IMPLANT

## 2024-01-02 NOTE — Evaluation (Signed)
 Physical Therapy Evaluation Patient Details Name: Charlotte Burns MRN: [id number] DOB: 02-12-1946 Today's Date: 01/02/2024  History of Present Illness  The patient is an 78 y.o. female here for right knee revision. Patient is 1 year and almost 8 months status post right total knee replacement. This began about 1 year after surgery and has not significant proved the last few months.  Clinical Impression  Pt responded well today with expected response same day PO TKA with no adverse signs or symptoms. Pt was mildly incontinent with standing prior to walking but was able to ambulate 50 feet with RW with supervision. Pt required vc to decrease movement speed for safety but is motivated to move. Pt ROM today looked great with 90 degrees of knee flexion, full knee extension but demonstrated mild quad lag with SLR. Pt will benefit from continued acute care rehab.       If plan is discharge home, recommend the following: A little help with walking and/or transfers;Assistance with cooking/housework;Assist for transportation;A lot of help with walking and/or transfers   Can travel by private vehicle        Equipment Recommendations  (pt expressed having the equipment needed)  Recommendations for Other Services  Rehab consult    Functional Status Assessment       Precautions / Restrictions Restrictions Weight Bearing Restrictions Per Provider Order: Yes RLE Weight Bearing Per Provider Order: Weight bearing as tolerated      Mobility  Bed Mobility Overal bed mobility: Needs Assistance Bed Mobility: Rolling, Sidelying to Sit, Supine to Sit, Sit to Supine Rolling: Independent Sidelying to sit: Independent Supine to sit: Independent, Used rails Sit to supine: Independent, Used rails        Transfers Overall transfer level: Needs assistance; vc for safety and required tactile cues for movement speed after several attempts of failed standing Equipment used: Rolling walker (2 wheels)                   Ambulation/Gait Ambulation/Gait assistance: Supervision Gait Distance (Feet): 80 Feet Assistive device: Rolling walker (2 wheels) Gait Pattern/deviations: Step-to pattern, Step-through pattern, Decreased step length - right, Decreased stance time - left     Pre-gait activities: incontinent wtih standing before walking General Gait Details: decreased gait speed  Stairs            Wheelchair Mobility     Tilt Bed    Modified Rankin (Stroke Patients Only)       Balance Overall balance assessment: Needs assistance Sitting-balance support: Bilateral upper extremity supported (with RW) Sitting balance-Leahy Scale: Normal       Standing balance-Leahy Scale: Fair                               Pertinent Vitals/Pain Pain Assessment Pain Assessment: 0-10 Pain Score: 2  Pain Location: R knee Pain Intervention(s): Repositioned    Home Living Family/patient expects to be discharged to:: Private residence Living Arrangements: Children Available Help at Discharge: Family Type of Home: House Home Access: Stairs to enter Entrance Stairs-Rails: Doctor, general practice of Steps: 3   Home Layout: Two level;Able to live on main level with bedroom/bathroom Home Equipment: Rolling Walker (2 wheels);Grab bars - toilet;Shower seat;Cane - single point;Shower seat - built in      Prior Function Prior Level of Function : Independent/Modified Independent             Mobility Comments: Indepedent with ambulation with  home and community distances; no history of falls ADLs Comments: indepent with ADLs     Extremity/Trunk Assessment                Communication        Cognition                                         Cueing       General Comments      Exercises Total Joint Exercises Ankle Circles/Pumps: AROM, 15 reps, Right Quad Sets: AROM, 10 reps, Right Short Arc Quad: AROM, 10 reps,  Right Heel Slides: AROM, 5 reps, Right Hip ABduction/ADduction: AROM, 10 reps, Right Straight Leg Raises: AROM, 10 reps, Right Long Arc Quad: AROM, 10 reps, Right Knee Flexion: AROM, 10 reps, Right Goniometric ROM: 90 degrees Marching in Standing: AROM, 10 reps, Standing   Assessment/Plan    PT Assessment Patient needs continued PT services  PT Problem List Decreased strength;Decreased balance;Pain;Decreased range of motion;Decreased mobility;Decreased activity tolerance;Decreased safety awareness       PT Treatment Interventions DME instruction;Gait training;Functional mobility training;Therapeutic activities;Therapeutic exercise;Balance training;Neuromuscular re-education;Patient/family education    PT Goals (Current goals can be found in the Care Plan section)  Acute Rehab PT Goals Patient Stated Goal: Pt express readiness to get moving  PT Goal Formulation: With patient/family Time For Goal Achievement: 01/10/24 Potential to Achieve Goals: Good    Frequency BID     Co-evaluation               AM-PAC PT 6 Clicks Mobility  Outcome Measure Help needed turning from your back to your side while in a flat bed without using bedrails?: A Little Help needed moving from lying on your back to sitting on the side of a flat bed without using bedrails?: None Help needed moving to and from a bed to a chair (including a wheelchair)?: A Little Help needed standing up from a chair using your arms (e.g., wheelchair or bedside chair)?: A Little Help needed to walk in hospital room?: A Lot Help needed climbing 3-5 steps with a railing? : Total 6 Click Score: 16    End of Session Equipment Utilized During Treatment: Gait belt Activity Tolerance: Patient tolerated treatment well Patient left: in chair;with family/visitor present;with chair alarm set   PT Visit Diagnosis: Unsteadiness on feet (R26.81);Other abnormalities of gait and mobility (R26.89);Muscle weakness (generalized)  (M62.81);Difficulty in walking, not elsewhere classified (R26.2)    Time: 1330-1404 PT Time Calculation (min) (ACUTE ONLY): 34 min   Charges:   PT Evaluation $PT Eval Low Complexity: 1 Low PT Treatments $Therapeutic Exercise: 8-22 mins PT General Charges $$ ACUTE PT VISIT: 1 Visit          Sherlean DELENA Lesches PT, DPT 01/02/2024, 3:07 PM

## 2024-01-02 NOTE — Anesthesia Postprocedure Evaluation (Signed)
 Anesthesia Post Note  Patient: Charlotte Burns  Procedure(s) Performed: REVISION, TOTAL ARTHROPLASTY, KNEE (Right: Knee)  Patient location during evaluation: PACU Anesthesia Type: Spinal Level of consciousness: oriented and awake and alert Pain management: pain level controlled Vital Signs Assessment: post-procedure vital signs reviewed and stable Respiratory status: spontaneous breathing, respiratory function stable and patient connected to nasal cannula oxygen Cardiovascular status: blood pressure returned to baseline and stable Postop Assessment: no headache, no backache, no apparent nausea or vomiting and spinal receding Anesthetic complications: no   No notable events documented.   Last Vitals:  Vitals:   01/02/24 1050 01/02/24 1055  BP:    Pulse: 65 72  Resp: 12 (!) 23  Temp:    SpO2: 92% 93%    Last Pain:  Vitals:   01/02/24 1035  TempSrc:   PainSc: 0-No pain                 Alfonso Ruths

## 2024-01-02 NOTE — Anesthesia Preprocedure Evaluation (Addendum)
 Anesthesia Evaluation  Patient identified by MRN, date of birth, ID band Patient awake    Reviewed: Allergy & Precautions, NPO status , Patient's Chart, lab work & pertinent test results  History of Anesthesia Complications Negative for: history of anesthetic complications  Airway Mallampati: III   Neck ROM: Full    Dental  (+) Missing   Pulmonary former smoker (quit 1974)   Pulmonary exam normal breath sounds clear to auscultation       Cardiovascular hypertension, Normal cardiovascular exam Rhythm:Regular Rate:Normal  ECG 12/26/23: normal   Neuro/Psych TIA Neuromuscular disease (neuropathy)    GI/Hepatic ,GERD  ,,  Endo/Other  diabetes, Type 2    Renal/GU negative Renal ROS     Musculoskeletal  (+) Arthritis ,    Abdominal   Peds  Hematology negative hematology ROS (+)   Anesthesia Other Findings Last dose of Mounjaro 12/24/23.  Reproductive/Obstetrics                              Anesthesia Physical Anesthesia Plan  ASA: 2  Anesthesia Plan: Spinal   Post-op Pain Management:    Induction: Intravenous  PONV Risk Score and Plan: 3 and Propofol  infusion, TIVA, Treatment may vary due to age or medical condition and Ondansetron   Airway Management Planned: Natural Airway and Nasal Cannula  Additional Equipment:   Intra-op Plan:   Post-operative Plan:   Informed Consent: I have reviewed the patients History and Physical, chart, labs and discussed the procedure including the risks, benefits and alternatives for the proposed anesthesia with the patient or authorized representative who has indicated his/her understanding and acceptance.       Plan Discussed with: CRNA  Anesthesia Plan Comments: (Plan for spinal and GA with natural airway, LMA/GETA backup.  Patient consented for risks of anesthesia including but not limited to:  - adverse reactions to medications - damage to  eyes, teeth, lips or other oral mucosa - nerve damage due to positioning  - sore throat or hoarseness - headache, bleeding, infection, nerve damage 2/2 spinal - damage to heart, brain, nerves, lungs, other parts of body or loss of life  Informed patient about role of CRNA in peri- and intra-operative care.  Patient voiced understanding.)         Anesthesia Quick Evaluation

## 2024-01-02 NOTE — Op Note (Signed)
 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  After a year of good function with her knee she began to develop some atraumatic posterior lateral rotary instability and deep flexion.  This was found to be reproducible on exam.  Workup was negative for infection or other concern for implant issues.  The risk and benefits of revision with femoral component exchange and tibial component polyethylene exchange were discussed the patient agreed move forward with the surgery.    Description of procedure: The patient was brought to the operating room where laterality was confirmed by all those present to be the right side.    Spinal anesthesia was administered and the patient received an intravenous dose of antibiotics for surgical prophylaxis and a dose of tranexamic acid .  Patient is positioned supine on the operating room table with all bony prominences well-padded.  A well-padded tourniquet was applied to the right thigh.  The knee was then prepped and draped in usual sterile fashion with multiple layers of adhesive and nonadhesive drapes.  All of those present in the operating room participated in a surgical timeout laterality and patient were confirmed.     An Esmarch was wrapped around the extremity and the leg was elevated and the knee flexed.  The tourniquet was  inflated to a pressure of 250 mmHg. The Esmarch was removed and the leg was brought down to full extension.  The patella and tibial tubercle identified as well as the previous surgical incision and outlined using a marking pen and a full-thickness incision was made on the midline in continuation with the prior incision with a knife carried through the subcutaneous tissue down to the extensor retinaculum.  After exposure of the extensor mechanism the medial parapatellar arthrotomy was performed with a scalpel and electrocautery extending down medial and distal to the tibial tubercle taking care to avoid incising the patellar tendon.    A standard medial release was performed over the proximal tibia exposing the existing tibial baseplate and polyethylene.  The knee was brought into extension in order to excise the scar tissue around the patellar tendon with care taken to avoid injuring the patellar tendon .  The synovium was found to be inflamed around the tibia.  Synovial fluid was found to be normal in appearance with no signs of infection.  A piece of synovium was removed and sent to pathology for evaluation under frozen section and found to have no signs of inflammation.   Scar excision and synovectomy was performed in the medial lateral gutters to aid in visualization and exposure of the components.  The interface around the femoral component and bone was cleared using electrocautery and rongeurs.  The interface between the femoral component and the bone was worked with a flexible osteotomes and a Gigli saw.  The chamfer interfaces were exposed and worked with a flexible osteotome.  After circumferential release of the interface between the bone and the  femoral component the femoral component was tamped off with minimal bone loss.   The femur was then prepared with a new box cut for a revision component.  Canal was opened with a reamer by hand and the trial component was placed on.  Was found to have good  fit and coverage.     A trial polyethylene was placed and the knee was taken through range of motion.  The knee was found to have good range of motion with full extension and flexion to 120 degrees with good stability through range of motion with a CPS component.   The patella was evaluated and found to have good fixation to bone with appropriate size and minimal scar overgrowth which was excised with cautery.  All the trial components were removed and placed on the back table to use with assembling the actual implants.   The correct final components for implantation were confirmed and opened by the circulator nurse.  The prepared surfaces of the  femur and tibia were cleaned with pulsatile lavage to remove all blood fat and other material and then the surfaces were dried.  1 bag of cement were mixed under vacuum and the component was cemented into place.  Excess cement was removed with curettes and forceps.  At this time the periarticular injection cocktail was placed in the soft tissues surrounding the knee.  The knee was then irrigated with copious amount of normal saline via pulsatile lavage to remove all loose bodies and other debris.  The real polyethylene was placed and locked into position and found to have good stability through range of motion.  The knee was then irrigated with surgiphor betadine based wash and reirrigated with saline.  The tourniquet was then dropped and all bleeding vessels were identified and coagulated.  The arthrotomy was approximated with #1 Vicryl and closed with #2 STRATAFIX suture.  The knee was brought into slight flexion and the subcutaneous tissues were closed with 0 Vicryl, 2-0 Vicryl and a running subcuticular 4-0 barbed monocryl based suture.  Skin was then glued with Dermabond.  A sterile adhesive dressing was then placed along with a sequential compression device to the calf, a Ted stocking, and a cryotherapy cuff.     Sponge, needle, and Lap counts were all  correct at the end of the case.   The patient was transferred off of the operating room table to a hospital bed, good pulses were found distally on the operative side.  The patient was transferred to the recovery room in stable condition.

## 2024-01-02 NOTE — Transfer of Care (Signed)
 Immediate Anesthesia Transfer of Care Note  Patient: Charlotte Burns  Procedure(s) Performed: Procedure(s) with comments: REVISION, TOTAL ARTHROPLASTY, KNEE (Right) - Right total knee revision, both components  Patient Location: PACU  Anesthesia Type:Spinal  Level of Consciousness: Drowsy  Airway & Oxygen Therapy: Patient Spontanous Breathing and Patient connected to face mask oxygen  Post-op Assessment: Report given to RN and Post -op Vital signs reviewed and stable  Post vital signs: Reviewed and stable  Last Vitals:  Vitals:   01/02/24 0621 01/02/24 1022  BP: (!) 188/85 136/74  Pulse: 65   Resp: 16 12  Temp: 37 C (!) 36.3 C  SpO2: 99% 99%    Complications: No apparent anesthesia complications

## 2024-01-02 NOTE — Progress Notes (Signed)
 Educated patient on using incentive spirometer and patient was able to perform return demonstration and verbalized understanding on the importance of the device.

## 2024-01-02 NOTE — Progress Notes (Signed)
 Patient to restroom and as patient sat down on commode, ace wrap noted with large amount of bloody drainage. Honeycomb dressing removed and replaced but more drainage noted. Dr. Lorelle notified and 4x4 gauze, abd pads and ace wrap reapplied. Patient ordered not to bend knee for the remainder of the evening. Will continue to monitor.

## 2024-01-02 NOTE — TOC Initial Note (Signed)
 Transition of Care [facility]) - Initial/Assessment Note    Patient Details  Name: Charlotte Burns MRN: [id number] Date of Birth: [birth year]  Transition of Care [facility]) CM/SW Contact:    Alvaro Louder, LCSW Phone Number: 01/02/2024, 4:11 PM  Clinical Narrative:   LCSWA met patient at bedside with son Redell. She indicated that she lives with her son and was able to dress herself prior to discharge. She stated that she has a RW and shower chair at home. She stated that her son will assist her at discharge and take her to appointments. The patient reported that her medication is affordable and her son will pick her up at discharge.   LCSWA discussed PT recommendation of OP Rehab and patient declined and preferred HHPT. The patient reported that she was set up with [facility] before. LCSWA reached out to [facility] Coordinator to confirm. LCSWA is awaiting response.   TOC to follow for discharge   Expected Discharge Plan: Home w Home Health Services Barriers to Discharge: Continued Medical Work up   Patient Goals and CMS Choice            Expected Discharge Plan and Services   Discharge Planning Services: CM Consult Post Acute Care Choice: Home Health Living arrangements for the past 2 months: Single Family Home                                      Prior Living Arrangements/Services Living arrangements for the past 2 months: Single Family Home Lives with:: Adult Children Patient language and need for interpreter reviewed:: Yes Do you feel safe going back to the place where you live?: Yes      Need for Family Participation in Patient Care: Yes (Comment) Care giver support system in place?: Yes (comment) Current home services: DME (RW, Shower Chair) Criminal Activity/Legal Involvement Pertinent to Current Situation/Hospitalization: No - Comment as needed  Activities of Daily Living   ADL Screening (condition at time of admission) Independently performs ADLs?: Yes  (appropriate for developmental age) Is the patient deaf or have difficulty hearing?: No Does the patient have difficulty seeing, even when wearing glasses/contacts?: No Does the patient have difficulty concentrating, remembering, or making decisions?: No  Permission Sought/Granted Permission sought to share information with : Case Manager                Emotional Assessment Appearance:: Appears older than stated age, Appears stated age Attitude/Demeanor/Rapport: Engaged Affect (typically observed): Appropriate Orientation: : Oriented to Self, Oriented to Place, Oriented to  Time, Oriented to Situation      Admission diagnosis:  Chronic instability of right knee [M23.51] Status post total right knee replacement [Z96.651] S/P revision of total knee, right [Z96.651] Patient Active Problem List   Diagnosis Date Noted   S/P revision of total knee, right 01/02/2024   S/P TKR (total knee replacement) using cement, right 07/02/2022   S/p reverse total shoulder arthroplasty 02/15/2020   Intraductal papilloma of breast, left 06/19/2017   OA (osteoarthritis) of knee 01/25/2014   [place] Acute blood loss anemia 02/26/2012   Postop Hyponatremia 02/26/2012   OA (osteoarthritis) of hip 02/25/2012   PCP:  Valora Agent, MD Pharmacy:   [facility] [address] [place] - [address] [place] [address] Phone: [phone] Fax: [phone]  [facility] - [place], [place] - [address] [place] [address] Phone: [phone] Fax: [phone]     Social Drivers of Health (SDOH) Social History: SDOH Screenings   Food Insecurity: No Food Insecurity (01/02/2024)  Housing: Low Risk  (01/02/2024)  Transportation Needs: No Transportation Needs (01/02/2024)  Utilities: Not At Risk (01/02/2024)  Financial Resource Strain: Medium Risk (09/03/2023)   Received from [facility]  Social Connections: Moderately Isolated  (01/02/2024)  Tobacco Use: Medium Risk (01/02/2024)   SDOH Interventions:     Readmission Risk Interventions     No data to display

## 2024-01-02 NOTE — H&P (Signed)
 History of Present Illness: The patient is an 78 y.o. female seen in clinic today for follow-up evaluation of her right knee prior to planned right knee revision. Patient is 1 year and almost 8 months status post right total knee replacement. Patient has no pain at rest but continues to have a posterior lateral rotary instability to her knee when not wearing her brace. This began about 1 year after surgery and has not significant proved the last few months. She is still working on strengthening her quadriceps muscle. Reports instability stopping her from being able to walk without a brace and gives her fear of falling. The patient denies fevers, chills, numbness, tingling, shortness of breath, chest pain, recent illness, or any trauma.  Past Medical History: Past Medical History:  Diagnosis Date  Acid reflux  Arthritis  Diabetes mellitus, type II (CMS/HHS-HCC)  Hyperparathyroidism (CMS/HHS-HCC)  Osteoarthritis  Postmenopausal   Past Surgical History: Past Surgical History:  Procedure Laterality Date  REPLACEMENT TOTAL HIP W/ RESURFACING IMPLANTS Right 02/29/2012  REPLACEMENT TOTAL HIP W/ RESURFACING IMPLANTS Left 2014  COLONOSCOPY 04/2013  CATARACT EXTRACTION Bilateral 2015  REPLACEMENT TOTAL KNEE Left 01/2014  Right reverse total shoulder arthroplasty, right biceps tenodesis Right 02/15/2020  Dr. Tobie   Past Family History: Family History  Problem Relation Age of Onset  Alzheimer's disease Mother 29  High blood pressure (Hypertension) Mother  Diabetes type II Mother  Transient ischemic attack Mother  Stomach cancer Father  Rheum arthritis Father  Heart disease Father  Osteoporosis (Thinning of bones) Father  Lupus Sister  Rheum arthritis Sister  Osteoporosis (Thinning of bones) Sister  Stomach cancer Sister  Diabetes Sister  Liver cancer Brother  mets to brain  No Known Problems Son  No Known Problems Son  No Known Problems Son  No Known Problems Son  No Known Problems  Maternal Grandmother  No Known Problems Maternal Grandfather  No Known Problems Paternal Grandmother  No Known Problems Paternal Grandfather   Medications: Current Outpatient Medications  Medication Sig Dispense Refill  tretinoin (RETIN-A) 0.05 % cream APPLY 0.25 GRAMS TO FACE NIGHTLY  acetaminophen  (TYLENOL ) 500 MG tablet Take 1,000 mg by mouth every 8 (eight) hours  DULoxetine  (CYMBALTA ) 60 MG DR capsule TAKE 1 CAPSULE BY MOUTH ONCE DAILY 30 capsule 11  gabapentin  (NEURONTIN ) 300 MG capsule TAKE 1 CAPSULE BY MOUTH AT BEDTIME 90 capsule 3  glipiZIDE (GLUCOTROL XL) 5 MG XL tablet Take 5 mg by mouth  lancets Use 1 each 2 (two) times daily. Use as instructed. 100 each 10  metFORMIN (GLUCOPHAGE-XR) 500 MG XR tablet Take 2 tablets (1,000 mg total) by mouth daily with dinner 180 tablet 3  olmesartan (BENICAR) 20 MG tablet TAKE 1 TABLET BY MOUTH ONCE DAILY 30 tablet 11  omeprazole (PRILOSEC) 20 MG DR capsule TAKE 1 CAPSULE BY MOUTH ONCE DAILY 30 capsule 11  rosuvastatin  (CRESTOR ) 5 MG tablet TAKE 1 TABLET BY MOUTH ONCE DAILY 30 tablet 11  tirzepatide (MOUNJARO) 7.5 mg/0.5 mL pen injector Inject 0.5 mLs (7.5 mg total) subcutaneously once a week 2 mL 5   No current facility-administered medications for this visit.   Allergies: No Known Allergies   Review of Systems:  A comprehensive 14 point ROS was performed, reviewed, and the pertinent orthopaedic findings are documented in the HPI.  Physical Exam: General/Constitutional: No apparent distress: well-nourished and well developed. Eyes: Pupils equal, round with synchronous movement. Pulmonary exam: Lungs clear to auscultation bilaterally no wheezing rales or rhonchi Cardiac exam: Regular rate and  rhythm no obvious murmurs rubs or gallops. Integumentary: No impressive skin lesions present, except as noted in detailed exam. Neuro/Psych: Normal mood and affect, oriented to person, place and time.  Right lower Extremity Exam The right knee  has a well-healed surgical incision in the midline with no evidence of erythema drainage no swelling. No evidence for infection Significant quadriceps atrophy unchanged from prior exam Range of motion 0-1 25. The knee is stable to varus and valgus stresses in extension and anterior drawer in flexion. When applying a posterior lateral force at about 100 degrees of flexion the tibia subluxes posterior laterally relative to the femur. This is reproducible on exam and is the sensation the patient gets with ambulation. She is neurovascular intact distally able to dorsiflex and plantarflex the foot and toes negative Homans' sign no calf tenderness all compartments soft  Imaging Studies: 3 view x-rays AP, lateral, sunrise of the right knee taken the last office visit images reviewed by myself show status post total knee arthroplasty with components in appropriate position. Patella tracking midline. No evidence of periprosthetic loosening or fracture.   Right knee aspiration 12/11/2023 Component Ref Range & Units (hover) 3 wk ago  Color, Synovial YELLOW  Appearance-Synovial HAZY Abnormal   Crystals, Fluid NO CRYSTALS SEEN  WBC, Synovial 52  Neutrophil, Synovial 1  Lymphocytes-Synovial Fld 50  Monocyte-Macrophage-Synovial Fluid 49  Eosinophils-Synovial 0    CRP<1 ESR 9  Assessment:  Posterior lateral rotary instability of the right knee status post total knee arthroplasty  Plan: Raymonde is a 78 year old female who initially was doing very well with her right total knee replacement. She appears to have developed posterior lateral rotary instability of her total knee with reproducible subluxation on exam. This has not improved with conservative therapy and bracing. She does have some quadriceps atrophy which is likely contributing to the instability however it is easily reproducible on exam today. She has no signs or symptoms of infection or other concerning findings on her x-ray imaging. We  discussed treatment options including continued conservative measures versus surgical intervention. At this point the patient would like to move forward with a revision surgery in order to provide more stability in her knee. Based upon the patient's continued symptoms and failure to respond to conservative treatment, I have recommended a revision with exchange of her femoral component to a posterior stabilized design with a constrained polyethylene liner. Hopefully will be able to preserve her tibial component. We did discuss the possibility of having to change her tibial component as well. We reviewed the risks of implant removal including fracture and periprosthetic injury. We discussed that general surgical risks are increased with revision surgery including the baseline surgical risks associated with knee replacement listed below. A knee model, similar to the implants that will be used during the operation, was utilized to demonstrate the implants. Choices of implant manufactures were discussed and reviewed. The ability to secure the implant utilizing cement or cementless (press fit) fixation was discussed. The approach and exposure was discussed.   The hospitalization and post-operative care and rehabilitation were also discussed. The use of perioperative antibiotics and DVT prophylaxis were discussed. The risk, benefits and alternatives to a surgical intervention were discussed at length with the patient. The patient was also advised of risks related to the medical comorbidities and elevated body mass index (BMI). A lengthy discussion took place to review the most common complications including but not limited to: stiffness, loss of function, complex regional pain syndrome, deep vein thrombosis,  pulmonary embolus, heart attack, stroke, infection, wound breakdown, numbness, intraoperative fracture, damage to nerves, tendon,muscles, arteries or other blood vessels, death and other possible complications from  anesthesia. The patient was told that we will take steps to minimize these risks by using sterile technique, antibiotics and DVT prophylaxis when appropriate and follow the patient postoperatively in the office setting to monitor progress. The possibility of recurrent pain, no improvement in pain and actual worsening of pain were also discussed with the patient.   Patient asked about and confirms no history of any reactions to metal or metal allergy in the past.  The discharge plan of care focused on the patient going home following surgery. The patient was encouraged to make the necessary arrangements to have someone stay with them when they are discharged home.   The benefits of surgery were discussed with the patient including the potential for improving the patient's current clinical condition through operative intervention. Alternatives to surgical intervention including continued conservative management were also discussed in detail. All questions were answered to the satisfaction of the patient. The patient participated and agreed to the plan of care as well as the use of the recommended implants for their total knee replacement surgery. An information packet was given to the patient to review prior to surgery.    Labs and aspiration with no concern for knee infection.  All questions answered patient agrees with above plan to make for a right total knee revision.

## 2024-01-02 NOTE — Interval H&P Note (Signed)
 Patient history and physical updated. Consent reviewed including risks, benefits, and alternatives to surgery. Patient agrees with above plan to proceed with Right knee revision with plan to switch femoral component and tibial polyethylene bearing.

## 2024-01-02 NOTE — Anesthesia Procedure Notes (Signed)
 Date/Time: 01/02/2024 7:46 AM  Performed by: Tod Handing, CRNAPre-anesthesia Checklist: Patient identified, Emergency Drugs available, Suction available and Patient being monitored Patient Re-evaluated:Patient Re-evaluated prior to induction Oxygen Delivery Method: Simple face mask Induction Type: IV induction Dental Injury: Teeth and Oropharynx as per pre-operative assessment

## 2024-01-02 NOTE — Anesthesia Procedure Notes (Signed)
 Spinal  Patient location during procedure: OR Start time: 01/02/2024 7:47 AM End time: 01/02/2024 7:48 AM Reason for block: surgical anesthesia Staffing Performed: resident/CRNA  Anesthesiologist: Shellie Odor, MD Resident/CRNA: Tod Handing, CRNA Performed by: Tod Handing, CRNA Authorized by: Shellie Odor, MD   Preanesthetic Checklist Completed: patient identified, IV checked, site marked, risks and benefits discussed, surgical consent, monitors and equipment checked, pre-op evaluation and timeout performed Spinal Block Patient position: sitting Prep: ChloraPrep Patient monitoring: heart rate, continuous pulse ox, blood pressure and cardiac monitor Approach: midline Location: L3-4 Injection technique: single-shot Needle Needle type: Whitacre and Introducer  Needle gauge: 24 G Needle length: 9 cm Assessment Sensory level: T10 Events: CSF return Additional Notes Sterile aseptic technique used throughout the procedure.  Negative paresthesia. Negative blood return. Positive free-flowing CSF. Expiration date of kit checked and confirmed. Patient tolerated procedure well, without complications.

## 2024-01-03 ENCOUNTER — Encounter: Payer: Self-pay | Admitting: Orthopedic Surgery

## 2024-01-03 LAB — SURGICAL PATHOLOGY

## 2024-01-03 LAB — CBC
HCT: 31.1 % — ABNORMAL LOW (ref 36.0–46.0)
Hemoglobin: 10.5 g/dL — ABNORMAL LOW (ref 12.0–15.0)
MCH: 31.8 pg (ref 26.0–34.0)
MCHC: 33.8 g/dL (ref 30.0–36.0)
MCV: 94.2 fL (ref 80.0–100.0)
Platelets: 171 K/uL (ref 150–400)
RBC: 3.3 MIL/uL — ABNORMAL LOW (ref 3.87–5.11)
RDW: 11.9 % (ref 11.5–15.5)
WBC: 9.1 K/uL (ref 4.0–10.5)
nRBC: 0 % (ref 0.0–0.2)

## 2024-01-03 LAB — BASIC METABOLIC PANEL WITH GFR
Anion gap: 8 (ref 5–15)
BUN: 16 mg/dL (ref 8–23)
CO2: 24 mmol/L (ref 22–32)
Calcium: 10.5 mg/dL — ABNORMAL HIGH (ref 8.9–10.3)
Chloride: 103 mmol/L (ref 98–111)
Creatinine, Ser: 0.86 mg/dL (ref 0.44–1.00)
GFR, Estimated: >60 mL/min (ref >60)
Glucose, Bld: 197 mg/dL — ABNORMAL HIGH (ref 70–99)
Potassium: 4.1 mmol/L (ref 3.5–5.1)
Sodium: 135 mmol/L (ref 135–145)

## 2024-01-03 MED ORDER — ENOXAPARIN SODIUM 30 MG/0.3ML IJ SOSY: 30.0000 mg | PREFILLED_SYRINGE | INTRAMUSCULAR | 0 refills | Status: AC

## 2024-01-03 MED ORDER — CEPHALEXIN 500 MG PO CAPS: 500.0000 mg | ORAL_CAPSULE | Freq: Four times a day (QID) | ORAL | 0 refills | Status: AC

## 2024-01-03 MED ORDER — TRAMADOL HCL 50 MG PO TABS: 50.0000 mg | ORAL_TABLET | Freq: Four times a day (QID) | ORAL | 0 refills | Status: AC | PRN

## 2024-01-03 MED ORDER — OXYCODONE HCL 5 MG PO TABS: 5.0000 mg | ORAL_TABLET | Freq: Three times a day (TID) | ORAL | 0 refills | Status: AC | PRN

## 2024-01-03 NOTE — Plan of Care (Signed)
  Problem: Activity: Goal: Ability to avoid complications of mobility impairment will improve Outcome: Progressing Goal: Range of joint motion will improve Outcome: Progressing   Problem: Pain Management: Goal: Pain level will decrease with appropriate interventions Outcome: Progressing   

## 2024-01-03 NOTE — Progress Notes (Signed)
 Physical Therapy Treatment Patient Details Name: Charlotte Burns MRN: [id number] DOB: 09-21-1945 Today's Date: 01/03/2024   History of Present Illness The patient is an 78 y.o. female seen today for right knee revision. Patient is 1 year and almost 8 months status post right total knee replacement. This began about 1 year after surgery and has not significant proved the last few months.    PT Comments  Pt was supine (HOB elevated ~ 15 degrees) in bed upon arrival.She is A and O x 4. Agreeable and motivated throughout. Pt endorses only 2/10 pain. Pain did not impact session progression. Pt easily and safely demonstrated abilities to exit bed, stand to RW, and tolerate ambulation > 274ft. She also was able to perform stairs to simulate home environment. Noted drainage from incisional site. RN is aware. Pt demonstrated > 90 degrees active knee flexion. She is cleared from an acute PT standpoint for safe DC home with continued skilled PT to maximize her independence and safety with all ADLs.     If plan is discharge home, recommend the following: A little help with walking and/or transfers;A little help with bathing/dressing/bathroom;Assistance with cooking/housework;Assist for transportation;Help with stairs or ramp for entrance     Equipment Recommendations  Other (comment) (Pe endorses having all DME needs already)       Precautions / Restrictions Precautions Precautions: Knee Recall of Precautions/Restrictions: Intact Restrictions Weight Bearing Restrictions Per Provider Order: Yes RLE Weight Bearing Per Provider Order: Weight bearing as tolerated     Mobility  Bed Mobility Overal bed mobility: Modified Independent Bed Mobility: Supine to Sit, Sit to Supine  General bed mobility comments: No physical assistance to exit bed    Transfers Overall transfer level: Needs assistance Equipment used: Rolling walker (2 wheels) Transfers: Sit to/from Stand Sit to Stand: Supervision    Ambulation/Gait Ambulation/Gait assistance: Supervision Gait Distance (Feet): 200 Feet Assistive device: Rolling walker (2 wheels) Gait Pattern/deviations: Step-through pattern  General Gait Details: no LOB or safety concerns. pt demonstrates safe step through gait sequencing   Stairs Stairs: Yes Stairs assistance: Supervision Stair Management: One rail Right, Step to pattern, Sideways Number of Stairs: 4 General stair comments: Pt was easily and safely able to perform stairs to simulate home entry   Balance Overall balance assessment: Needs assistance Sitting-balance support: Feet supported Sitting balance-Leahy Scale: Normal     Standing balance support: Bilateral upper extremity supported, During functional activity Standing balance-Leahy Scale: Good      Communication Communication Communication: No apparent difficulties  Cognition Arousal: Alert Behavior During Therapy: WFL for tasks assessed/performed   PT - Cognitive impairments: No apparent impairments    PT - Cognition Comments: Pt is A and O x 4. PLeasant and motivated Following commands: Intact      Cueing Cueing Techniques: Verbal cues     General Comments General comments (skin integrity, edema, etc.): Reviewed car transfers, importance of positioning/routine mobility, and expectations for HHPT. pt states understanding and feels confident in safe DC home with sons x 2 assisting      Pertinent Vitals/Pain Pain Assessment Pain Assessment: 0-10 Pain Score: 2  Pain Location: R knee Pain Descriptors / Indicators: Discomfort Pain Intervention(s): Limited activity within patient's tolerance, Monitored during session, Premedicated before session, Repositioned, Ice applied     PT Goals (current goals can now be found in the care plan section) Acute Rehab PT Goals Patient Stated Goal: go home Progress towards PT goals: Progressing toward goals    Frequency  BID       AM-PAC PT 6 Clicks  Mobility   Outcome Measure  Help needed turning from your back to your side while in a flat bed without using bedrails?: None Help needed moving from lying on your back to sitting on the side of a flat bed without using bedrails?: None Help needed moving to and from a bed to a chair (including a wheelchair)?: A Little Help needed standing up from a chair using your arms (e.g., wheelchair or bedside chair)?: A Little Help needed to walk in hospital room?: A Little Help needed climbing 3-5 steps with a railing? : A Little 6 Click Score: 20    End of Session   Activity Tolerance: Patient tolerated treatment well Patient left: in bed;with call bell/phone within reach;with nursing/sitter in room (RN tech in room at conclusion of PT session) Nurse Communication: Mobility status PT Visit Diagnosis: Unsteadiness on feet (R26.81);Other abnormalities of gait and mobility (R26.89);Muscle weakness (generalized) (M62.81);Difficulty in walking, not elsewhere classified (R26.2)     Time: 9268-9250 PT Time Calculation (min) (ACUTE ONLY): 18 min  Charges:    $Therapeutic Activity: 8-22 mins PT General Charges $$ ACUTE PT VISIT: 1 Visit                    Rankin Essex PTA 01/03/24, 7:59 AM

## 2024-01-03 NOTE — Discharge Summary (Signed)
 Physician Discharge Summary  Subjective: 1 Day Post-Op Procedure(s) (LRB): REVISION, TOTAL ARTHROPLASTY, KNEE (Right) Patient reports pain as mild.   Patient seen in rounds with Dr. Lorelle. Patient is well, and has had no acute complaints or problems Denies any CP, SOB, N/V, fevers or chills We will start therapy today.  Patient is ready to go home  Physician Discharge Summary  Patient ID: Charlotte Burns MRN: [id number] DOB/AGE: 80-Jul-1947 80 y.o.  Admit date: 01/02/2024 Discharge date: 01/03/2024  Admission Diagnoses:  Discharge Diagnoses:  Principal Problem:   S/P revision of total knee, right   Discharged Condition: good  Hospital Course: Patient presented to the hospital on 01/02/2024 for an elective right total knee revision arthroplasty performed by Dr. Lorelle.  she tolerated the procedure well without any complications. See procedural note below for details. Postoperatively, the patient did very well. she was able to pass PT protocols on post-op day one without any issues. she was able to void her bladder without any difficulty. Physical exam was unremarkable. she denies any SOB, CP, N/V, fevers or chills. Vital signs are stable. Patient is stable to discharge home.   Treatments: none  Discharge Exam: Blood pressure (!) 156/79, pulse 77, temperature (!) 97.3 F (36.3 C), temperature source Temporal, resp. rate 18, height 5' 7 (1.702 m), weight 78.9 kg, SpO2 95%.   Disposition: home   Allergies as of 01/03/2024   No Known Allergies      Medication List     TAKE these medications    acetaminophen  500 MG tablet Commonly known as: TYLENOL  Take 2 tablets (1,000 mg total) by mouth every 8 (eight) hours. What changed:  when to take this reasons to take this additional instructions   B COMPLEX-VITAMIN B12 PO Take 1 tablet by mouth as needed.   Biotin 10000 MCG Tabs Take by mouth daily.   cephALEXin  500 MG capsule Commonly known as: KEFLEX  Take 1  capsule (500 mg total) by mouth 4 (four) times daily for 3 days.   docusate sodium  100 MG capsule Commonly known as: COLACE Take 1 capsule (100 mg total) by mouth 2 (two) times daily. What changed:  when to take this reasons to take this   DULoxetine  60 MG capsule Commonly known as: CYMBALTA  Take 60 mg by mouth every evening.   ELDERBERRY PO Take 50 mg by mouth daily.   enoxaparin  30 MG/0.3ML injection Commonly known as: LOVENOX  Inject 0.3 mLs (30 mg total) into the skin daily for 14 days. Start taking on: January 04, 2024   gabapentin  300 MG capsule Commonly known as: NEURONTIN  Take 300 mg by mouth every evening.   metFORMIN 500 MG 24 hr tablet Commonly known as: GLUCOPHAGE-XR Take 1,000 mg by mouth every evening.   Mounjaro 2.5 MG/0.5ML Pen Generic drug: tirzepatide Inject 2.5 mg into the skin once a week. On Tuesdays   olmesartan 20 MG tablet Commonly known as: BENICAR Take 20 mg by mouth every morning.   omeprazole 20 MG capsule Commonly known as: PRILOSEC Take 20 mg by mouth every morning.   OVER THE COUNTER MEDICATION as needed. NZT-48 for brain health   oxyCODONE  5 MG immediate release tablet Commonly known as: Roxicodone  Take 1 tablet (5 mg total) by mouth every 8 (eight) hours as needed.   QC TUMERIC COMPLEX PO Take 1,000 mg by mouth daily.   rosuvastatin  5 MG tablet Commonly known as: CRESTOR  Take 5 mg by mouth every evening.   traMADol  50 MG tablet Commonly known as:  ULTRAM  Take 1 tablet (50 mg total) by mouth every 6 (six) hours as needed for moderate pain (pain score 4-6).   Vitamin D3 50 MCG (2000 UT) Tabs Take 1 tablet by mouth daily.   vitamin E 180 MG (400 UNITS) capsule Take 400 Units by mouth daily.   Voltaren Arthritis Pain 1 % Gel Generic drug: diclofenac Sodium Apply 2 g topically 4 (four) times daily as needed.         Signed: Sidra Koyanagi 01/03/2024, 8:48 AM   Objective: Vital signs in last 24 hours: Temp:  [97 F  (36.1 C)-97.6 F (36.4 C)] 97.3 F (36.3 C) (08/01 0747) Pulse Rate:  [63-88] 77 (08/01 0747) Resp:  [10-23] 18 (08/01 0303) BP: (115-160)/(67-90) 156/79 (08/01 0747) SpO2:  [90 %-100 %] 95 % (08/01 0747)  Intake/Output from previous day:  Intake/Output Summary (Last 24 hours) at 01/03/2024 0848 Last data filed at 01/02/2024 1516 Gross per 24 hour  Intake 1623.89 ml  Output 825 ml  Net 798.89 ml    Intake/Output this shift: No intake/output data recorded.  Labs: Recent Labs    01/03/24 0657  HGB 10.5*   Recent Labs    01/03/24 0657  WBC 9.1  RBC 3.30*  HCT 31.1*  PLT 171   Recent Labs    01/03/24 0657  NA 135  K 4.1  CL 103  CO2 24  BUN 16  CREATININE 0.86  GLUCOSE 197*  CALCIUM  10.5*   No results for input(s): LABPT, INR in the last 72 hours.  EXAM: General - Patient is Alert and Appropriate Extremity - Neurologically intact Neurovascular intact Sensation intact distally Intact pulses distally Dorsiflexion/Plantar flexion intact No cellulitis present Compartment soft Dressing - Mild drainage on base of incision. Area cleaned, new skin glue applied and redressed with new honeycomb bandage.  Motor Function - intact, moving foot and toes well on exam.   Assessment/Plan: 1 Day Post-Op Procedure(s) (LRB): REVISION, TOTAL ARTHROPLASTY, KNEE (Right) Procedure(s) (LRB): REVISION, TOTAL ARTHROPLASTY, KNEE (Right) Past Medical History:  Diagnosis Date   Arthritis    Chest tightness    ECHO AND STRESS    Chronic instability of right knee    Complication of anesthesia    slow to wake up , done in [facility]   Diabetes mellitus without complication (HCC)    DJD (degenerative joint disease)    GERD (gastroesophageal reflux disease)    H/O hypercholesterolemia    told one time   History of shingles    left side of face jan 2015   Hypertension    Pneumonia    hx of age 64   Shortness of breath    occasional, usually with activity    TIA (transient ischemic attack)    Principal Problem:   S/P revision of total knee, right  Estimated body mass index is 27.25 kg/m as calculated from the following:   Height as of this encounter: 5' 7 (1.702 m).   Weight as of this encounter: 78.9 kg.  Patient will continue to work with physical therapy to pass postoperative PT protocols, ROM and strengthening   Discussed with the patient continuing to utilize Polar Care   Patient will use bone foam in 20-30 minute intervals   Patient will wear TED hose bilaterally to help prevent DVT and clot formation   Discussed the Aquacel bandage.  This bandage will stay in place 7 days postoperatively.  Can be replaced with honeycomb bandages that will be sent home with  the patient   Discussed sending the patient home with tramadol  and oxycodone  for as needed pain management.  Patient will take lovenox  once daily for DVT prophylaxis   Had some slight drainage along inferior portion of incision, cleaned, new skin glue applied and redressed. Will be sent home with 3 days of keflex  coverage   Weight-Bearing as tolerated to right leg   Patient will follow-up with [facility] clinic orthopedics in 2 weeks for staple removal and reevaluation  Diet - Regular diet Follow up - in 2 weeks Activity - WBAT, gentle return to flexion Disposition - Home Condition Upon Discharge - Good DVT Prophylaxis - Lovenox  and TED hose  Fonda CHARLENA Koyanagi, PA-C Orthopaedic Surgery 01/03/2024, 8:48 AM

## 2024-01-03 NOTE — Plan of Care (Signed)

## 2024-01-03 NOTE — Progress Notes (Signed)
 Patient discharging home. IV removed. Dressing changed by Dr Lorelle, clean dry and intact. 2 honeycomb dressings sent with patient. Discharge instructions given to patient, verbalized understanding. Son to transport patient home.

## 2024-01-03 NOTE — Progress Notes (Signed)
 Subjective: 1 Day Post-Op Procedure(s) (LRB): REVISION, TOTAL ARTHROPLASTY, KNEE (Right) Patient reports pain as mild.   Patient seen in rounds with Dr. Lorelle. Patient is well, and has had no acute complaints or problems Denies any CP, SOB, N/V, fevers or chills We will start therapy today.  Plan is to go Home after hospital stay.  Objective: Vital signs in last 24 hours: Temp:  [97 F (36.1 C)-97.6 F (36.4 C)] 97.3 F (36.3 C) (08/01 0747) Pulse Rate:  [63-88] 77 (08/01 0747) Resp:  [10-23] 18 (08/01 0303) BP: (115-160)/(67-90) 156/79 (08/01 0747) SpO2:  [90 %-100 %] 95 % (08/01 0747)  Intake/Output from previous day:  Intake/Output Summary (Last 24 hours) at 01/03/2024 0842 Last data filed at 01/02/2024 1516 Gross per 24 hour  Intake 1623.89 ml  Output 825 ml  Net 798.89 ml    Intake/Output this shift: No intake/output data recorded.  Labs: Recent Labs    01/03/24 0657  HGB 10.5*   Recent Labs    01/03/24 0657  WBC 9.1  RBC 3.30*  HCT 31.1*  PLT 171   Recent Labs    01/03/24 0657  NA 135  K 4.1  CL 103  CO2 24  BUN 16  CREATININE 0.86  GLUCOSE 197*  CALCIUM  10.5*   No results for input(s): LABPT, INR in the last 72 hours.  EXAM General - Patient is Alert and Appropriate Extremity - Neurologically intact Neurovascular intact Sensation intact distally Intact pulses distally Dorsiflexion/Plantar flexion intact No cellulitis present Compartment soft Dressing - Mild drainage on base of incision. Area cleaned, new skin glue applied and redressed with new honeycomb bandage.  Motor Function - intact, moving foot and toes well on exam.    Past Medical History:  Diagnosis Date   Arthritis    Chest tightness    ECHO AND STRESS    Chronic instability of right knee    Complication of anesthesia    slow to wake up , done in [facility]   Diabetes mellitus without complication (HCC)    DJD (degenerative joint disease)    GERD  (gastroesophageal reflux disease)    H/O hypercholesterolemia    told one time   History of shingles    left side of face jan 2015   Hypertension    Pneumonia    hx of age 66   Shortness of breath    occasional, usually with activity   TIA (transient ischemic attack)     Assessment/Plan: 1 Day Post-Op Procedure(s) (LRB): REVISION, TOTAL ARTHROPLASTY, KNEE (Right) Principal Problem:   S/P revision of total knee, right  Estimated body mass index is 27.25 kg/m as calculated from the following:   Height as of this encounter: 5' 7 (1.702 m).   Weight as of this encounter: 78.9 kg. Advance diet Up with therapy  Patient will continue to work with physical therapy to pass postoperative PT protocols, ROM and strengthening  Discussed with the patient continuing to utilize Polar Care  Patient will use bone foam in 20-30 minute intervals  Patient will wear TED hose bilaterally to help prevent DVT and clot formation  Discussed the Aquacel bandage.  This bandage will stay in place 7 days postoperatively.  Can be replaced with honeycomb bandages that will be sent home with the patient  Discussed sending the patient home with tramadol  and oxycodone  for as needed pain management.  Patient will take lovenox  once daily for DVT prophylaxis  Had some slight drainage along inferior portion  of incision, cleaned, new skin glue applied and redressed. Will be sent home with 3 days of keflex  coverage  Weight-Bearing as tolerated to right leg  Patient will follow-up with [facility] clinic orthopedics in 2 weeks for staple removal and reevaluation  Fonda Koyanagi, PA-C [facility] Orthopaedics 01/03/2024, 8:42 AM

## 2024-01-03 NOTE — Discharge Instructions (Signed)
 Instructions after Total Knee Replacement   Charlotte Burns M.D.     Dept. of Orthopaedics & Sports Medicine  [facility]  [address]  [place], [place]  [address]  Phone: [phone]   Fax: [phone]    DIET: Drink plenty of non-alcoholic fluids. Resume your normal diet. Include foods high in fiber.  ACTIVITY:  You may use crutches or a walker with weight-bearing as tolerated, unless instructed otherwise. You may be weaned off of the walker or crutches by your Physical Therapist.  Do NOT place pillows under the knee. Anything placed under the knee could limit your ability to straighten the knee.   Continue doing gentle exercises. Exercising will reduce the pain and swelling, increase motion, and prevent muscle weakness.   Please continue to use the TED compression stockings for 2 weeks. You may remove the stockings at night, but should reapply them in the morning. Do not drive or operate any equipment until instructed.  WOUND CARE:  Continue to use the PolarCare or ice packs periodically to reduce pain and swelling. You may begin showering 3 days after surgery with honeycomb dressing. Remove honeycomb dressing 7 days after surgery and continue showering. Allow dermabond to fall off on its own.  MEDICATIONS: You may resume your regular medications. Please take the pain medication as prescribed on the medication. Do not take pain medication on an empty stomach. You have been given a prescription for a blood thinner (Lovenox or Coumadin). Please take the medication as instructed. (NOTE: After completing a 2 week course of Lovenox, take one 81 mg Enteric-coated aspirin twice a day for 3 additional weeks. This along with elevation will help reduce the possibility of phlebitis in your operated leg.) Do not drive or drink alcoholic beverages when taking pain medications.  POSTOPERATIVE CONSTIPATION PROTOCOL Constipation - defined medically as fewer than three stools per  week and severe constipation as less than one stool per week.  One of the most common issues patients have following surgery is constipation.  Even if you have a regular bowel pattern at home, your normal regimen is likely to be disrupted due to multiple reasons following surgery.  Combination of anesthesia, postoperative narcotics, change in appetite and fluid intake all can affect your bowels.  In order to avoid complications following surgery, here are some recommendations in order to help you during your recovery period.  Colace (docusate) - Pick up an over-the-counter form of Colace or another stool softener and take twice a day as long as you are requiring postoperative pain medications.  Take with a full glass of water daily.  If you experience loose stools or diarrhea, hold the colace until you stool forms back up.  If your symptoms do not get better within 1 week or if they get worse, check with your doctor.  Dulcolax (bisacodyl) - Pick up over-the-counter and take as directed by the product packaging as needed to assist with the movement of your bowels.  Take with a full glass of water.  Use this product as needed if not relieved by Colace only.   MiraLax (polyethylene glycol) - Pick up over-the-counter to have on hand.  MiraLax is a solution that will increase the amount of water in your bowels to assist with bowel movements.  Take as directed and can mix with a glass of water, juice, soda, coffee, or tea.  Take if you go more than two days without a movement. Do not use MiraLax more than once per day.  Call your doctor if you are still constipated or irregular after using this medication for 7 days in a row.  If you continue to have problems with postoperative constipation, please contact the office for further assistance and recommendations.  If you experience "the worst abdominal pain ever" or develop nausea or vomiting, please contact the office immediatly for further recommendations for  treatment.   CALL THE OFFICE FOR: Temperature above 101 degrees Excessive bleeding or drainage on the dressing. Excessive swelling, coldness, or paleness of the toes. Persistent nausea and vomiting.  FOLLOW-UP:  You should have an appointment to return to the office in 14 days after surgery. Arrangements have been made for continuation of Physical Therapy (either home therapy or outpatient therapy).

## 2024-01-03 NOTE — TOC Transition Note (Signed)
 Transition of Care [facility]) - Discharge Note   Patient Details  Name: Charlotte Burns MRN: [id number] Date of Birth: [birth year]  Transition of Care [facility] CM/SW Contact:  Alvaro Louder, LCSW Phone Number: 01/03/2024, 9:08 AM   Clinical Narrative:  [facility] coordinator reached out to [facility] to inform patient is out of network. [facility] coordinator reached out to [facility] to inform that patient was set up and will be discharging with [facility]. No TOC needs at this time.   TOC signing off    Final next level of care: Home w Home Health Services Barriers to Discharge: No Barriers Identified   Patient Goals and CMS Choice            Discharge Placement                Patient to be transferred to facility by: Son Redell Name of family member notified: Self Patient and family notified of of transfer: 01/03/24  Discharge Plan and Services Additional resources added to the After Visit Summary for     Discharge Planning Services: CM Consult Post Acute Care Choice: Home Health                    HH Arranged: PT [facility] Agency: [facility] Date [facility] Agency Contacted: 01/03/24   Representative spoke with at [facility] Agency: [place]  [facility]  Social Drivers of Health (SDOH) Interventions SDOH Screenings   Food Insecurity: No Food Insecurity (01/02/2024)  Housing: Low Risk  (01/02/2024)  Transportation Needs: No Transportation Needs (01/02/2024)  Utilities: Not At Risk (01/02/2024)  Financial Resource Strain: Medium Risk (09/03/2023)   Received from [facility]  Social Connections: Moderately Isolated (01/02/2024)  Tobacco Use: Medium Risk (01/02/2024)     Readmission Risk Interventions     No data to display

## 2024-01-07 LAB — AEROBIC/ANAEROBIC CULTURE W GRAM STAIN (SURGICAL/DEEP WOUND)
Culture: NO GROWTH
Culture: NO GROWTH
Gram Stain: NONE SEEN
Gram Stain: NONE SEEN
